# Patient Record
Sex: Female | Born: 1980 | Race: Black or African American | Hispanic: No | Marital: Single
Health system: Midwestern US, Community
[De-identification: ages and names within clinical notes are randomized; demographics above are authoritative.]

## PROBLEM LIST (undated history)

## (undated) DIAGNOSIS — O039 Complete or unspecified spontaneous abortion without complication: Principal | ICD-10-CM

## (undated) DIAGNOSIS — J4541 Moderate persistent asthma with (acute) exacerbation: Secondary | ICD-10-CM

## (undated) DIAGNOSIS — O26893 Other specified pregnancy related conditions, third trimester: Secondary | ICD-10-CM

## (undated) DIAGNOSIS — J45909 Unspecified asthma, uncomplicated: Secondary | ICD-10-CM

---

## 2013-06-25 ENCOUNTER — Emergency Department (HOSPITAL_COMMUNITY): Payer: Medicaid - Out of State

## 2013-06-25 ENCOUNTER — Emergency Department (HOSPITAL_COMMUNITY)
Admission: EM | Admit: 2013-06-25 | Discharge: 2013-06-25 | Disposition: A | Discharge status: Home / Self Care | Payer: Medicaid - Out of State | Attending: Emergency Medicine | Admitting: Emergency Medicine

## 2013-06-25 ENCOUNTER — Encounter (HOSPITAL_COMMUNITY): Payer: Self-pay | Admitting: Emergency Medicine

## 2013-06-25 DIAGNOSIS — R509 Fever, unspecified: Secondary | ICD-10-CM | POA: Insufficient documentation

## 2013-06-25 DIAGNOSIS — J45909 Unspecified asthma, uncomplicated: Secondary | ICD-10-CM | POA: Insufficient documentation

## 2013-06-25 DIAGNOSIS — N289 Disorder of kidney and ureter, unspecified: Secondary | ICD-10-CM

## 2013-06-25 DIAGNOSIS — F17200 Nicotine dependence, unspecified, uncomplicated: Secondary | ICD-10-CM | POA: Insufficient documentation

## 2013-06-25 DIAGNOSIS — R05 Cough: Secondary | ICD-10-CM

## 2013-06-25 DIAGNOSIS — IMO0001 Reserved for inherently not codable concepts without codable children: Secondary | ICD-10-CM | POA: Insufficient documentation

## 2013-06-25 DIAGNOSIS — R059 Cough, unspecified: Secondary | ICD-10-CM

## 2013-06-25 DIAGNOSIS — R197 Diarrhea, unspecified: Secondary | ICD-10-CM

## 2013-06-25 DIAGNOSIS — R000 Tachycardia, unspecified: Secondary | ICD-10-CM | POA: Insufficient documentation

## 2013-06-25 DIAGNOSIS — R112 Nausea with vomiting, unspecified: Secondary | ICD-10-CM | POA: Insufficient documentation

## 2013-06-25 DIAGNOSIS — R630 Anorexia: Secondary | ICD-10-CM | POA: Insufficient documentation

## 2013-06-25 DIAGNOSIS — J3489 Other specified disorders of nose and nasal sinuses: Secondary | ICD-10-CM | POA: Insufficient documentation

## 2013-06-25 DIAGNOSIS — R1110 Vomiting, unspecified: Secondary | ICD-10-CM

## 2013-06-25 DIAGNOSIS — E860 Dehydration: Secondary | ICD-10-CM

## 2013-06-25 HISTORY — DX: Unspecified asthma, uncomplicated: J45.909

## 2013-06-25 LAB — BASIC METABOLIC PANEL
BUN: 9 mg/dL (ref 6–23)
CO2: 18 meq/L — AB (ref 19–32)
Calcium: 8.6 mg/dL (ref 8.4–10.5)
Chloride: 103 mEq/L (ref 96–112)
Creatinine, Ser: 1.13 mg/dL — ABNORMAL HIGH (ref 0.50–1.10)
GFR calc Af Amer: 73 mL/min — ABNORMAL LOW (ref >90)
GFR calc non Af Amer: 63 mL/min — ABNORMAL LOW (ref >90)
GLUCOSE: 109 mg/dL — AB (ref 70–99)
Potassium: 4 mEq/L (ref 3.7–5.3)
SODIUM: 138 meq/L (ref 137–147)

## 2013-06-25 MED ORDER — ACETAMINOPHEN 325 MG PO TABS
650.0000 mg | ORAL_TABLET | Freq: Once | ORAL | Status: AC
  Administered 2013-06-25: 650 mg via ORAL
  Filled 2013-06-25: qty 2

## 2013-06-25 MED ORDER — ONDANSETRON 4 MG PO TBDP
8.0000 mg | ORAL_TABLET | Freq: Once | ORAL | Status: AC
  Administered 2013-06-25: 8 mg via ORAL
  Filled 2013-06-25: qty 2

## 2013-06-25 MED ORDER — SODIUM CHLORIDE 0.9 % IV BOLUS (SEPSIS)
1000.0000 mL | Freq: Once | INTRAVENOUS | Status: AC
  Administered 2013-06-25: 1000 mL via INTRAVENOUS

## 2013-06-25 MED ORDER — IBUPROFEN 800 MG PO TABS: 800.0000 mg | ORAL_TABLET | Freq: Once | ORAL | Status: DC

## 2013-06-25 MED ORDER — ONDANSETRON 4 MG PO TBDP: ORAL_TABLET | ORAL | Status: AC

## 2013-06-25 NOTE — Discharge Instructions (Signed)
If you were given medicines take as directed.  If you are on coumadin or contraceptives realize their levels and effectiveness is altered by many different medicines.  If you have any reaction (rash, tongues swelling, other) to the medicines stop taking and see a physician.   Please follow up as directed and return to the ER or see a physician for new or worsening symptoms.  Thank you. Take tylenol and ibuprofen for aches and fevers. Stay well hydrated.

## 2013-06-25 NOTE — ED Provider Notes (Addendum)
CSN: [id number]     Arrival date & time 06/25/13  0341 History   First MD Initiated Contact with Patient 06/25/13 [phone]     Chief Complaint  Patient presents with  . Influenza  . Emesis   (Consider location/radiation/quality/duration/timing/severity/associated sxs/prior Treatment) HPI Comments: 33 yo female with smoking hx presents with vomiting, cough, diarrhea for 3 days.  Nothing improves sxs. No sick contacts.  Told by pcp that she has the flu.  No travel. No neck stiffness.    Patient is a 33 y.o. female presenting with flu symptoms and vomiting. The history is provided by the patient.  Influenza Presenting symptoms: cough, diarrhea, fever, myalgias, nausea and vomiting   Presenting symptoms: no headaches and no shortness of breath   Associated symptoms: chills and nasal congestion   Associated symptoms: no neck stiffness   Emesis Associated symptoms: arthralgias, chills, diarrhea and myalgias   Associated symptoms: no abdominal pain and no headaches     Past Medical History  Diagnosis Date  . Asthma    History reviewed. No pertinent past surgical history. History reviewed. No pertinent family history. History  Substance Use Topics  . Smoking status: Current Every Day Smoker -- 0.25 packs/day    Types: Cigarettes  . Smokeless tobacco: Not on file  . Alcohol Use: No   OB History   Grav Para Term Preterm Abortions TAB SAB Ect Mult Living                 Review of Systems  Constitutional: Positive for fever, chills and appetite change.  HENT: Positive for congestion.   Eyes: Negative for visual disturbance.  Respiratory: Positive for cough. Negative for shortness of breath.   Cardiovascular: Negative for chest pain.  Gastrointestinal: Positive for nausea, vomiting and diarrhea. Negative for abdominal pain.  Genitourinary: Negative for dysuria and flank pain.  Musculoskeletal: Positive for arthralgias and myalgias. Negative for back pain, neck pain and neck stiffness.   Skin: Negative for rash.  Neurological: Negative for light-headedness and headaches.    Allergies  Review of patient's allergies indicates no known allergies.  Home Medications   Current Outpatient Rx  Name  Route  Sig  Dispense  Refill  . ibuprofen (ADVIL,MOTRIN) 200 MG tablet   Oral   Take 800 mg by mouth every 6 (six) hours as needed for fever or moderate pain.         Marland Kitchen Phenylephrine-DM-GG-APAP (TYLENOL COLD/FLU SEVERE PO)   Oral   Take 30 mLs by mouth daily as needed (cold and flu).          BP 102/57  Pulse 127  Temp(Src) 100.6 F (38.1 C) (Oral)  Resp 19  SpO2 98% Physical Exam  Nursing note and vitals reviewed. Constitutional: She appears well-developed and well-nourished.  HENT:  Head: Normocephalic and atraumatic.  Dry m m  Eyes: Right eye exhibits no discharge. Left eye exhibits no discharge.  Neck: Normal range of motion. Neck supple. No tracheal deviation present.  Cardiovascular: Regular rhythm.  Tachycardia present.   Pulmonary/Chest: Effort normal and breath sounds normal.  Abdominal: Soft. She exhibits no distension. There is no tenderness. There is no guarding.  Musculoskeletal: She exhibits no edema.  Neurological: She is alert.  Skin: Skin is warm. No rash noted.    ED Course  Procedures (including critical care time) Labs Review Labs Reviewed  BASIC METABOLIC PANEL - Abnormal; Notable for the following:    CO2 18 (*)    Glucose, Bld 109 (*)  Creatinine, Ser 1.13 (*)    GFR calc non Af Amer 63 (*)    GFR calc Af Amer 73 (*)    All other components within normal limits   Imaging Review Dg Chest 2 View  06/25/2013   CLINICAL DATA:  Fever, vomiting and cough.  EXAM: CHEST  2 VIEW  COMPARISON:  None.  FINDINGS: The lungs are well-aerated and clear. There is no evidence of focal opacification, pleural effusion or pneumothorax.  The heart is normal in size; the mediastinal contour is within normal limits. No acute osseous abnormalities are  seen.  IMPRESSION: No acute cardiopulmonary process seen.   Electronically Signed   By: Roanna RaiderJeffery  Chang M.D.   On: 06/25/2013 05:11    EKG Interpretation   None       MDM   1. Vomiting   2. Diarrhea   3. Cough   ARI Dehydration  Flu sxs. CXR to rout pneumonia. PO and IV fluids, antiemetics. Non toxic appearing. No meningismus.   Pt improved significantly on recheck. CXR no pneumonia, reviewed. Likely viral syndrome. Results and differential diagnosis were discussed with the patient. Close follow up outpatient was discussed, patient comfortable with the plan.   Filed Vitals:   06/25/13 0348 06/25/13 0528 06/25/13 0537  BP: 102/57 106/67   Pulse: 127  89  Temp: 100.6 F (38.1 C)    TempSrc: Oral    Resp: 19    SpO2: 98%        Enid SkeensJoshua M Cyril Woodmansee, MD 06/25/13 [id number]  Enid SkeensJoshua M Halie Gass, MD 06/25/13 [id number]

## 2013-06-25 NOTE — ED Notes (Signed)
Pt ambulatory to b/r, steady gait. Alert, NAD, calm.

## 2013-06-25 NOTE — ED Notes (Signed)
Pt reports dx with flu 2 days ago; reports starting to have blood in emesis and diarrhea today; pt reports has not been feeling better and was to come back to ed; pt last took tylenol 4 hours ago and ibuprofen 30 minutes ago

## 2014-12-31 IMAGING — CR DG CHEST 2V
2 series · 2 of 2 positions shown · non-contrast
Comparison: None.

CLINICAL DATA: Fever, vomiting and cough.

EXAM:
CHEST  2 VIEW

[w chest pa]
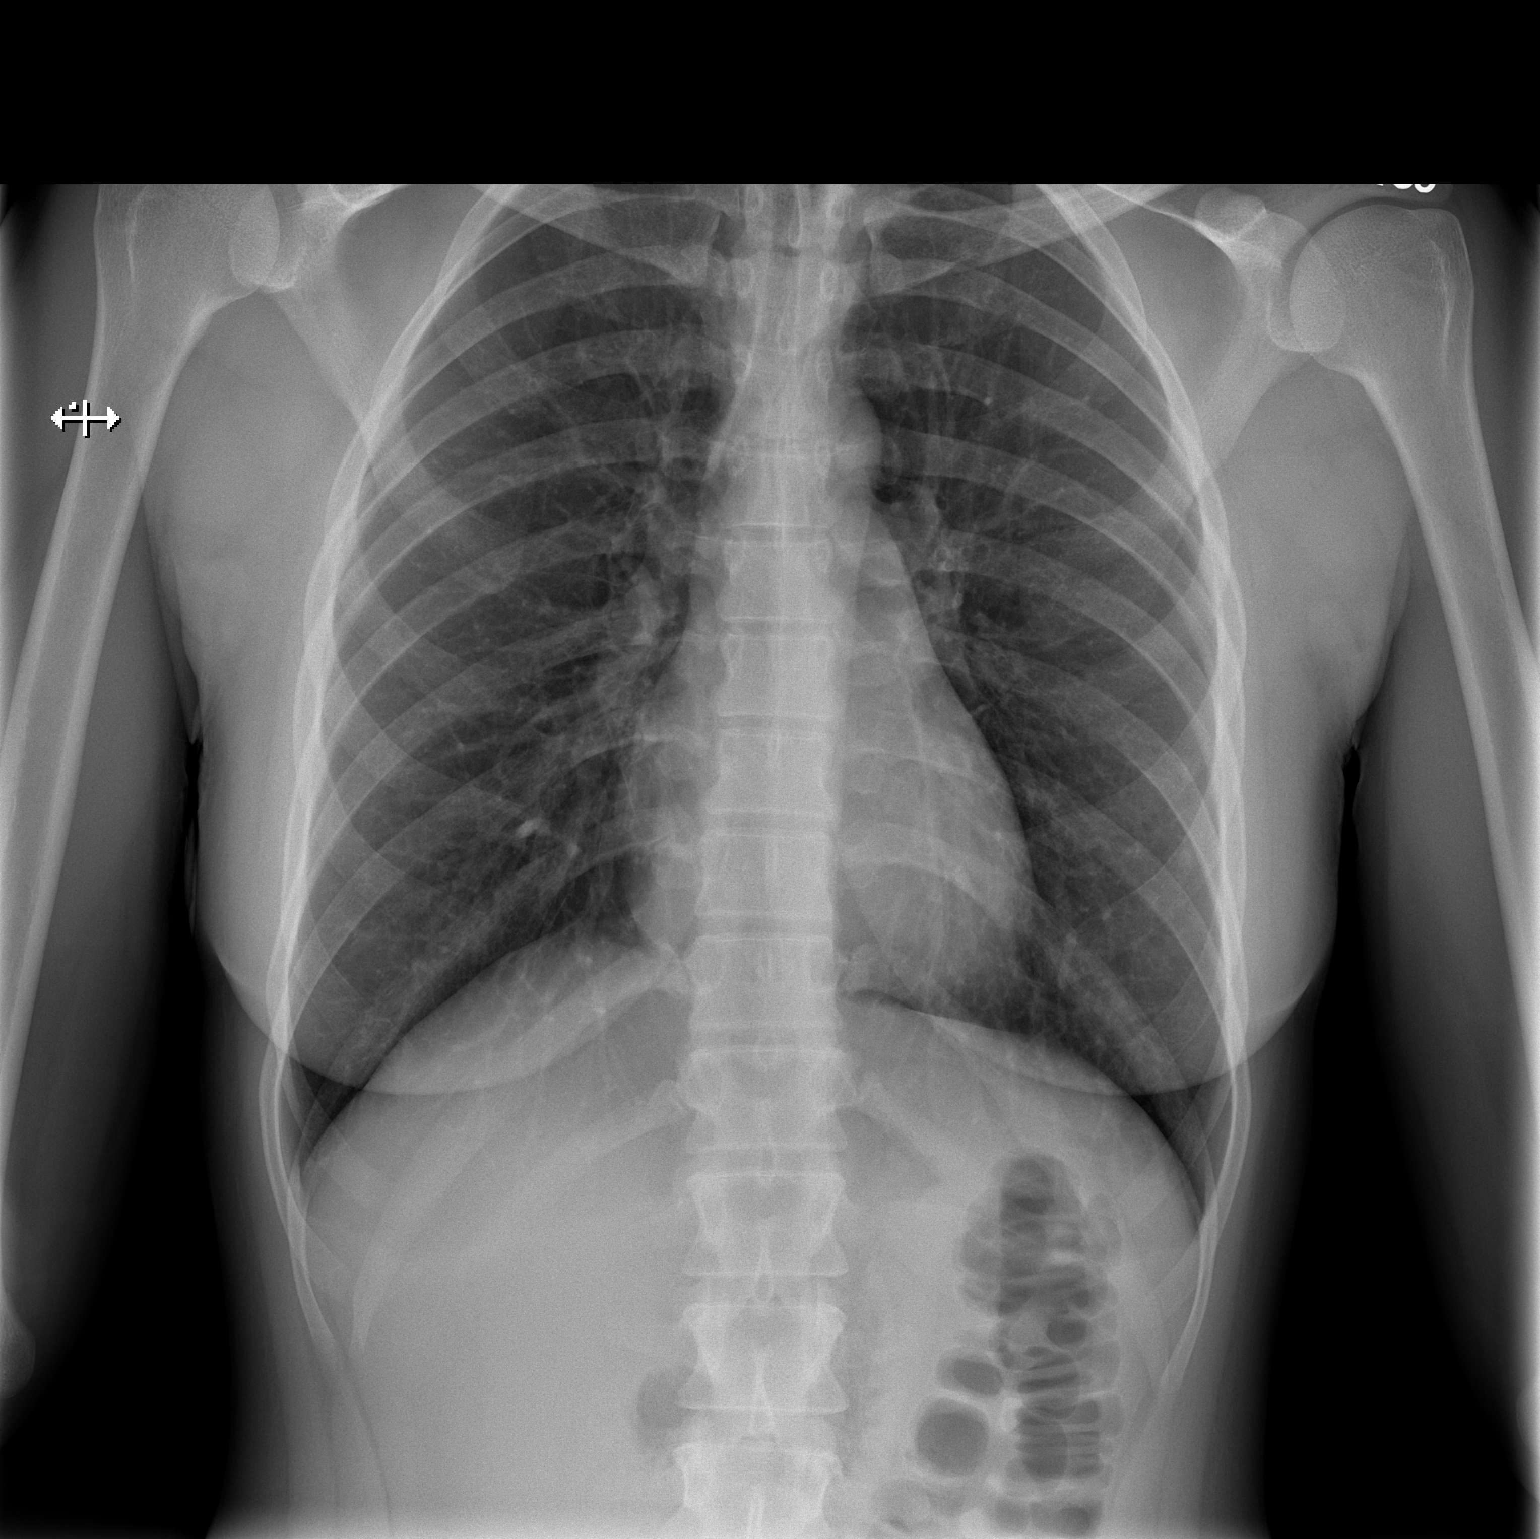

[w chest lat]
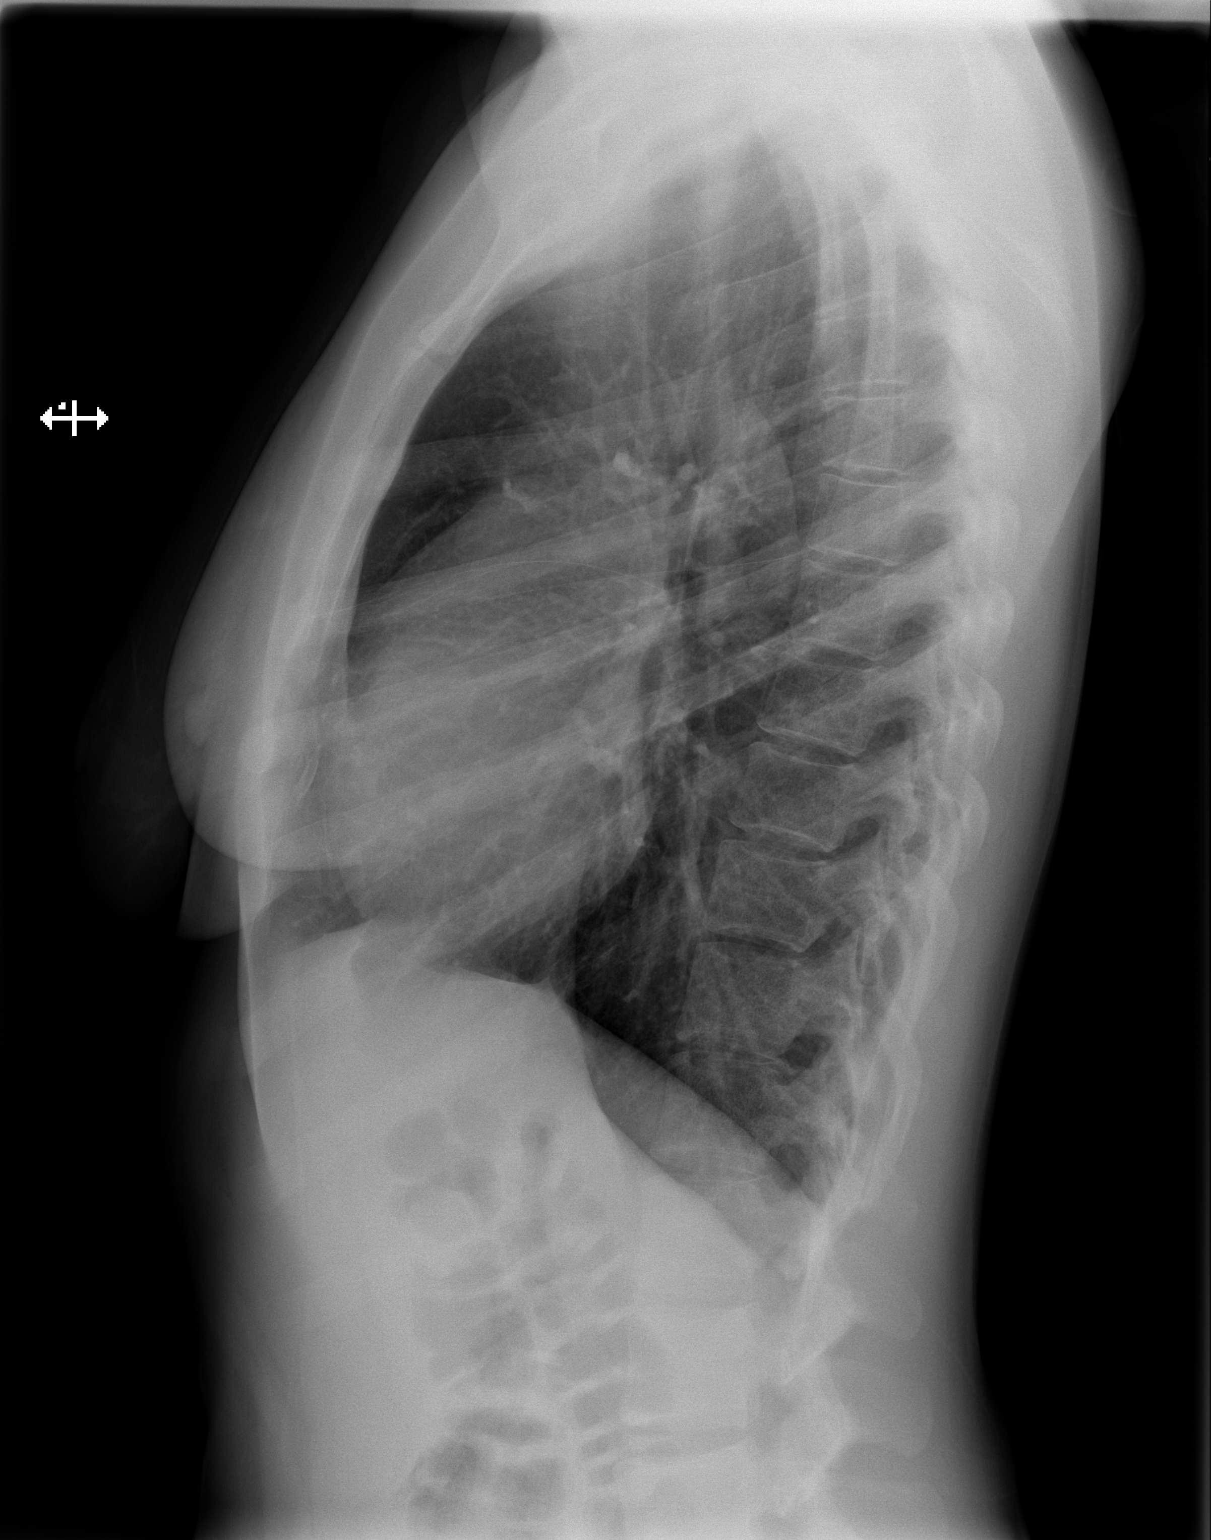

[2 of 2 positions shown; findings below may reference images not displayed]

FINDINGS: The lungs are well-aerated and clear. There is no evidence of focal
opacification, pleural effusion or pneumothorax.

The heart is normal in size; the mediastinal contour is within
normal limits. No acute osseous abnormalities are seen.
IMPRESSION: No acute cardiopulmonary process seen.

## 2015-08-20 ENCOUNTER — Inpatient Hospital Stay
Admit: 2015-08-20 | Discharge: 2015-08-20 | Disposition: A | Discharge status: Home / Self Care | Payer: MEDICAID | Attending: Emergency Medicine

## 2015-08-20 DIAGNOSIS — N898 Other specified noninflammatory disorders of vagina: Secondary | ICD-10-CM

## 2015-08-20 LAB — WET PREP: Wet prep: NONE SEEN

## 2015-08-20 LAB — URINALYSIS W/ REFLEX CULTURE
Bacteria: NEGATIVE /hpf
Bilirubin: NEGATIVE
Blood: NEGATIVE
Glucose: NEGATIVE mg/dL
Ketone: NEGATIVE mg/dL
Leukocyte Esterase: NEGATIVE
Nitrites: NEGATIVE
Protein: NEGATIVE mg/dL
Specific gravity: 1.028 (ref 1.003–1.030)
Urobilinogen: 1 EU/dL (ref 0.2–1.0)
pH (UA): 6 (ref 5.0–8.0)

## 2015-08-20 LAB — KOH, OTHER SOURCES: KOH: NONE SEEN

## 2015-08-20 NOTE — ED Provider Notes (Addendum)
HPI Comments: Connie Morgan is a 35 y.o. female with hx asthma who presents ambulatory to the ED c/o vaginal itching. She reports vaginal irritation that started yesterday morning, with associated dysuria. She was using the restroom today and noticed a "bump" to her vaginal region, so she came to the ED for evaluation. Her LMP was 08/08/2015. She specifically denies vaginal bleeding, vaginal discharge, nausea, vomiting,     PCP: Phys Other, MD  OB/GYN: Kathrene Alu, MD  Soc Hx: +tobacco, -EtOH, -drug use    There are no other complaints, changes or physical findings at this time.    The history is provided by the patient.        Past Medical History:   Diagnosis Date   ??? Asthma        History reviewed. No pertinent surgical history.      History reviewed. No pertinent family history.    Social History     Social History   ??? Marital status: SINGLE     Spouse name: N/A   ??? Number of children: N/A   ??? Years of education: N/A     Occupational History   ??? Not on file.     Social History Main Topics   ??? Smoking status: Light Tobacco Smoker     Packs/day: 0.25   ??? Smokeless tobacco: Not on file   ??? Alcohol use No   ??? Drug use: Yes   ??? Sexual activity: Yes     Partners: Male      Comment: Unprotected sex with one person x 1 year.      Other Topics Concern   ??? Not on file     Social History Narrative   ??? No narrative on file         ALLERGIES: Review of patient's allergies indicates no known allergies.    Review of Systems   Constitutional: Negative.  Negative for fever.   Eyes: Negative.    Respiratory: Negative.  Negative for shortness of breath.    Cardiovascular: Negative for chest pain.   Gastrointestinal: Negative for abdominal pain, nausea and vomiting.   Endocrine: Negative.    Genitourinary: Positive for dysuria. Negative for difficulty urinating, hematuria, vaginal bleeding and vaginal discharge.        +vaginal itching   Musculoskeletal: Negative.    Skin: Negative.    Allergic/Immunologic: Negative.     Neurological: Negative.    Psychiatric/Behavioral: Negative for suicidal ideas.   All other systems reviewed and are negative.      Vitals:    08/20/15 0119 08/20/15 0143 08/20/15 0147   BP: (!) 142/123 130/84    Pulse: 78     Resp: 20 20    Temp: 98 ??F (36.7 ??C)     SpO2: 98% 100% 100%   Weight: 82.6 kg (182 lb 1.6 oz)     Height:  (1.753 m)              Physical Exam   Constitutional: She is oriented to person, place, and time. She appears well-developed and well-nourished. No distress.   HENT:   Head: Normocephalic and atraumatic.   Nose: Nose normal.   Eyes: Conjunctivae and EOM are normal. No scleral icterus.   Neck: Normal range of motion. No tracheal deviation present.   Cardiovascular: Normal rate, regular rhythm, normal heart sounds and intact distal pulses.  Exam reveals no friction rub.    No murmur heard.  Pulmonary/Chest: Effort normal and breath sounds  normal. No stridor. No respiratory distress. She has no wheezes. She has no rales.   Abdominal: Soft. Bowel sounds are normal. She exhibits no distension. There is no tenderness. There is no rebound.   Genitourinary: Uterus normal.       Pelvic exam was performed with patient prone. No labial fusion. There is no rash, lesion or injury on the right labia. There is tenderness on the left labia. There is no rash, lesion or injury on the left labia. Uterus is not deviated, not enlarged, not fixed and not tender. Cervix exhibits discharge. Cervix exhibits no motion tenderness and no friability. Right adnexum displays no mass, no tenderness and no fullness. Left adnexum displays no mass, no tenderness and no fullness. Vaginal discharge (minimal, scant mucoid) found.   Musculoskeletal: Normal range of motion. She exhibits no tenderness.   Neurological: She is alert and oriented to person, place, and time. No cranial nerve deficit.   Skin: Skin is warm and dry. No rash noted. She is not diaphoretic.    Psychiatric: She has a normal mood and affect. Her behavior is normal. Judgment and thought content normal.   Nursing note and vitals reviewed.       MDM  Number of Diagnoses or Management Options  Dysuria:   Ingrown hair:   Vaginal itching:   Diagnosis management comments: DDX:  Sti, herpes, ingrown hair, pregnancy, uti, yeast infection    Plan:  pelvis, ua, upt    Impression:  Dysuria, ingrown hair, vaginal itching         Amount and/or Complexity of Data Reviewed  Clinical lab tests: ordered and reviewed  Review and summarize past medical records: yes      ED Course       Procedures  I reviewed our electronic medical record system for any past medical records that were available that may contribute to the patients current condition, the nursing notes and and vital signs from today's visit    Nursing notes will be reviewed as they become available in realtime while the pt has been in the ED.  Mellody LifeMarie R. Jeniyah Menor, MD    1:50 AM  Discussed the risks of smoking and the benefits of smoking cessation with the pt who verbalized her understanding.   Mellody LifeMarie R. Kanika Bungert, MD    Procedure Note - Pelvic Exam:    2:22 AM  Performed by: Mellody LifeMarie R. Andrina Locken, MD  Chaperoned by: Allyn KennerShannon Cox, ED Tech (female)  Pelvic exam was performed using bimanual and speculum. No pain,no adnexal tenderness, scant amount mucoid discharge. Bump to pt labia resembles ingrown hair. Recommended using warm compresses. Further findings noted in physical exam.   The procedure took 1-15 minutes, and pt tolerated well.  Written by Rosetta PosnerPaul Middleton, ED Scribe, as dictated by Mellody LifeMarie R. Tiarrah Saville, MD.      Progress note:  Pt noted to be feeling relieved, ready for discharge. Discussed labs findings with pt.  Will follow up as instructed. All questions have been answered, pt voiced understanding and agreement with plan.  Discussed use of warm compresses and use of a washcloth while in the shower. Discussed refraiing from shaving the area or waxing until it has fully healed.  Specific return precautions provided as well as instructions to return to the ED should sx worsen at any time.  Mellody LifeMarie R. Mattis Featherly, MD    LABORATORY TESTS:  Recent Results (from the past 12 hour(s))   URINALYSIS W/ REFLEX CULTURE    Collection Time: 08/20/15  1:57  AM   Result Value Ref Range    Color YELLOW/STRAW      Appearance CLOUDY (A) CLEAR      Specific gravity 1.028 1.003 - 1.030      pH (UA) 6.0 5.0 - 8.0      Protein NEGATIVE  NEG mg/dL    Glucose NEGATIVE  NEG mg/dL    Ketone NEGATIVE  NEG mg/dL    Bilirubin NEGATIVE  NEG      Blood NEGATIVE  NEG      Urobilinogen 1.0 0.2 - 1.0 EU/dL    Nitrites NEGATIVE  NEG      Leukocyte Esterase NEGATIVE  NEG      WBC 0-4 0 - 4 /hpf    RBC 0-5 0 - 5 /hpf    Epithelial cells MODERATE (A) FEW /lpf    Bacteria NEGATIVE  NEG /hpf    UA:UC IF INDICATED CULTURE NOT INDICATED BY UA RESULT CNI      Hyaline cast 2-5 0 - 5 /lpf   KOH, OTHER SOURCES    Collection Time: 08/20/15  2:23 AM   Result Value Ref Range    Special Requests: NO SPECIAL REQUESTS      KOH NO YEAST SEEN     WET PREP    Collection Time: 08/20/15  2:23 AM   Result Value Ref Range    Clue cells CLUE CELLS PRESENT      Wet prep NO TRICHOMONAS SEEN         IMAGING RESULTS:  No orders to display       MEDICATIONS GIVEN:  Medications - No data to display    IMPRESSION:  1. Vaginal itching    2. Ingrown hair    3. Dysuria        PLAN:  1. DC home    2.   Follow-up Information     Follow up With Details Comments Contact Info    Your OB/GYN Schedule an appointment as soon as possible for a visit in 2 days          Return to ED if worse       3:20 AM   The patient's results have been reviewed with family and/or caregiver. They verbally convey their understanding and agreement of the patient's signs, symptoms, diagnosis, treatment and prognosis and additionally agree to follow up as recommended in the discharge instructions or to return to the Emergency Room should the patient's condition change prior to their  follow-up appointment. The family and/or caregiver verbally agrees with the care-plan and all of their questions have been answered. The discharge instructions have also been provided to the them with educational information regarding the patient's diagnosis as well a list of reasons why the patient would want to return to the ER prior to their follow-up appointment should their condition change.  Mellody Life, MD            This note will not be viewable in MyChart.

## 2015-08-21 LAB — CHLAMYDIA/GC PCR
Chlamydia amplified: NEGATIVE
N. gonorrhea, amplified: NEGATIVE

## 2015-09-16 ENCOUNTER — Emergency Department: Admit: 2015-09-17 | Payer: MEDICAID | Primary: Family Medicine

## 2015-09-16 DIAGNOSIS — R112 Nausea with vomiting, unspecified: Secondary | ICD-10-CM

## 2015-09-16 NOTE — ED Notes (Signed)
Bedside and Verbal shift change report given to Jim, RN (oncoming nurse) by Matt, RN (offgoing nurse). Report included the following information SBAR, Kardex, ED Summary, MAR and Recent Results.

## 2015-09-16 NOTE — ED Provider Notes (Signed)
HPI Comments: Connie Morgan is a 35 y.o. female with a PMH of asthma presenting ambulatory to the ED from home C/O diarrhea, nausea and vomiting. Patient states that 4 days ago she began having non-bloody watery bowel movements. She states that she has had normal bowel movements occasionally, but several watery stools per day. 2 days ago she began to experience some nausea and non-bloody non-bilious emesis. She states that she is able to keep food and drink down, but has occasional episodes of vomiting. She denies any fevers, chills, new foods, recent travel or antibiotic usage, abdominal pain, dysuria or hematuria. She states that she is currently on day 6 of her menstrual cycle. She denies any heavier than normal bleeding. Denies any history of food allergies, IBS or celiac disease. She states that no one else around her has been getting sick. Patient denies any other symptoms or complaints.    PCP: Phys Other, MD    There are no other complaints, changes or physical findings at this time.    The history is provided by the patient. No language interpreter was used.        Past Medical History:   Diagnosis Date   ??? Asthma        No past surgical history on file.      No family history on file.    Social History     Social History   ??? Marital status: SINGLE     Spouse name: N/A   ??? Number of children: N/A   ??? Years of education: N/A     Occupational History   ??? Not on file.     Social History Main Topics   ??? Smoking status: Light Tobacco Smoker     Packs/day: 0.25   ??? Smokeless tobacco: Not on file   ??? Alcohol use No   ??? Drug use: Yes   ??? Sexual activity: Yes     Partners: Male      Comment: Unprotected sex with one person x 1 year.      Other Topics Concern   ??? Not on file     Social History Narrative   ??? No narrative on file         ALLERGIES: Review of patient's allergies indicates no known allergies.    Review of Systems   Constitutional: Negative for chills and fever.   HENT: Negative for sore throat.     Eyes: Negative for pain.   Respiratory: Negative for cough and shortness of breath.    Cardiovascular: Negative for chest pain.   Gastrointestinal: Positive for diarrhea, nausea and vomiting. Negative for abdominal pain and blood in stool.   Genitourinary: Negative for dysuria and hematuria.   Musculoskeletal: Negative for arthralgias and myalgias.   Skin: Negative for rash.   Neurological: Negative for dizziness, light-headedness, numbness and headaches.   Psychiatric/Behavioral: Negative for behavioral problems and confusion.       Vitals:    09/16/15 2146   BP: 120/76   Pulse: 65   Resp: 18   Temp: 97.9 ??F (36.6 ??C)   SpO2: 100%   Weight: 82.6 kg (182 lb 1.6 oz)   Height: 5' 9"  (1.753 m)            Physical Exam   Constitutional: She is oriented to person, place, and time. She appears well-developed and well-nourished. No distress.   35 y/o AAF in no acute distress. Appears well hydrated and mentating normally.   HENT:  Head: Normocephalic and atraumatic.   Right Ear: External ear normal.   Left Ear: External ear normal.   Nose: Nose normal.   Eyes: Conjunctivae and EOM are normal.   Neck: Normal range of motion. Neck supple.   Cardiovascular: Normal rate, regular rhythm, normal heart sounds and intact distal pulses.    No murmur heard.  Pulmonary/Chest: Effort normal and breath sounds normal. No respiratory distress. She has no wheezes.   Abdominal: Soft. Normal appearance and bowel sounds are normal. She exhibits no distension. There is no tenderness. There is no guarding and no CVA tenderness.   Musculoskeletal: Normal range of motion. She exhibits no edema or tenderness.   Neurological: She is alert and oriented to person, place, and time.   Skin: Skin is warm and dry. No rash noted. She is not diaphoretic.   Psychiatric: She has a normal mood and affect. Her behavior is normal. Judgment normal.   Nursing note and vitals reviewed.       MDM  Number of Diagnoses or Management Options   Diarrhea, unspecified type:   Non-intractable vomiting with nausea, unspecified vomiting type:   Diagnosis management comments: Patient presents with abdominal pain.  DDx: Gastroenteritis, SBO, appendicitis, colitis, IBD, diverticulitis,ureteral stone, GU pathology. Will get labs and serial abdominal exams to determine if a CT is warranted.    Upon re-examination, the patient has no focal abdominal tenderness to palpation.  The patient has a normal WBC and signs and symptoms are consistent with a non-surgical cause of abdominal pain.  The patient has been instructed to return to the ER if the abdominal pain has not improved within 24 hours or if they have any further change in condition for a CT scan of the abdomen and pelvis.  The diagnosis, test results, medications, return instructions and follow up have been discussed with the patient.  The patient has been given the opportunity to ask questions.   The patient agrees and expresses understanding of the diagnosis, follow up and return instructions.  The patient expresses understanding that although testing today is negative that a surgical issue could still develop and that follow up for a CT scan is essential if the symptoms have not improved in 24 hours.       Amount and/or Complexity of Data Reviewed  Clinical lab tests: reviewed and ordered  Tests in the radiology section of CPT??: reviewed and ordered      ED Course       Procedures    Chief Complaint   Patient presents with   ??? Diarrhea     x 4 days patient states that she hasnt been eating much because it just runs right out of her   ??? Vomiting     x2 days        11:57 PM  The patients presenting problems have been discussed, and they are in agreement with the care plan formulated and outlined with them.  I have encouraged them to ask questions as they arise throughout their visit.    MEDICATIONS GIVEN:  Medications   ondansetron (ZOFRAN) injection 4 mg (4 mg IntraVENous Given 09/16/15 2324)    ketorolac (TORADOL) injection 30 mg (30 mg IntraVENous Given 09/16/15 2324)   sodium chloride 0.9 % bolus infusion 1,000 mL (1,000 mL IntraVENous New Bag 09/16/15 2324)       LABS REVIEWED:  Recent Results (from the past 24 hour(s))   CBC WITH AUTOMATED DIFF    Collection Time: 09/16/15  9:55  PM   Result Value Ref Range    WBC 4.1 3.6 - 11.0 K/uL    RBC 4.39 3.80 - 5.20 M/uL    HGB 12.2 11.5 - 16.0 g/dL    HCT 34.4 (L) 35.0 - 47.0 %    MCV 78.4 (L) 80.0 - 99.0 FL    MCH 27.8 26.0 - 34.0 PG    MCHC 35.5 30.0 - 36.5 g/dL    RDW 14.0 11.5 - 14.5 %    PLATELET 329 150 - 400 K/uL    NEUTROPHILS 42 32 - 75 %    LYMPHOCYTES 47 12 - 49 %    MONOCYTES 8 5 - 13 %    EOSINOPHILS 2 0 - 7 %    BASOPHILS 1 0 - 1 %    ABS. NEUTROPHILS 1.7 (L) 1.8 - 8.0 K/UL    ABS. LYMPHOCYTES 1.9 0.8 - 3.5 K/UL    ABS. MONOCYTES 0.3 0.0 - 1.0 K/UL    ABS. EOSINOPHILS 0.1 0.0 - 0.4 K/UL    ABS. BASOPHILS 0.0 0.0 - 0.1 K/UL   METABOLIC PANEL, COMPREHENSIVE    Collection Time: 09/16/15  9:55 PM   Result Value Ref Range    Sodium 143 136 - 145 mmol/L    Potassium 3.5 3.5 - 5.1 mmol/L    Chloride 109 (H) 97 - 108 mmol/L    CO2 26 21 - 32 mmol/L    Anion gap 8 5 - 15 mmol/L    Glucose 89 65 - 100 mg/dL    BUN 7 6 - 20 MG/DL    Creatinine 0.89 0.55 - 1.02 MG/DL    BUN/Creatinine ratio 8 (L) 12 - 20      GFR est AA >60 >60 ml/min/1.96m    GFR est non-AA >60 >60 ml/min/1.779m   Calcium 8.2 (L) 8.5 - 10.1 MG/DL    Bilirubin, total 0.3 0.2 - 1.0 MG/DL    ALT (SGPT) 16 12 - 78 U/L    AST (SGOT) 10 (L) 15 - 37 U/L    Alk. phosphatase 69 45 - 117 U/L    Protein, total 7.4 6.4 - 8.2 g/dL    Albumin 3.4 (L) 3.5 - 5.0 g/dL    Globulin 4.0 2.0 - 4.0 g/dL    A-G Ratio 0.9 (L) 1.1 - 2.2     LIPASE    Collection Time: 09/16/15  9:55 PM   Result Value Ref Range    Lipase 107 73 - 393 U/L   URINALYSIS W/ REFLEX CULTURE    Collection Time: 09/16/15 10:09 PM   Result Value Ref Range    Color YELLOW/STRAW      Appearance CLEAR CLEAR       Specific gravity 1.030 1.003 - 1.030      pH (UA) 5.5 5.0 - 8.0      Protein NEGATIVE  NEG mg/dL    Glucose NEGATIVE  NEG mg/dL    Ketone NEGATIVE  NEG mg/dL    Bilirubin NEGATIVE  NEG      Blood NEGATIVE  NEG      Urobilinogen 1.0 0.2 - 1.0 EU/dL    Nitrites NEGATIVE  NEG      Leukocyte Esterase NEGATIVE  NEG      UA:UC IF INDICATED CULTURE NOT INDICATED BY UA RESULT CNI      WBC 0-4 0 - 4 /hpf    RBC 0-5 0 - 5 /hpf    Epithelial cells FEW FEW /lpf  Bacteria NEGATIVE  NEG /hpf    Hyaline cast 0-2 0 - 5 /lpf   HCG URINE, QL    Collection Time: 09/16/15 10:09 PM   Result Value Ref Range    HCG urine, Ql. NEGATIVE  NEG         VITAL SIGNS:  Patient Vitals for the past 12 hrs:   Temp Pulse Resp BP SpO2   09/16/15 2146 97.9 ??F (36.6 ??C) 65 18 120/76 100 %       RADIOLOGY RESULTS:  The following have been ordered and reviewed:  XR ABD FLAT/ ERECT   Final Result   Exam: 2 view abdomen  ??  Indication: Vomiting, diarrhea  ??  Supine and upright views of the abdomen demonstrate a normal bowel gas pattern.  Lung bases are clear. No free air. Osseous structures are unremarkable.  ??  IMPRESSION  Impression: Normal bowel gas pattern.       PROGRESS NOTES:  12:17 AM  I have just reevaluated the patient. I have reviewed her vital signs and determined there is currently no worsening in their condition or physical exam. Results have been reviewed with them and their questions have been answered. Discussed likely viral cause to symptoms. Reviewed return precautions including fever, worsening vomiting or blood in vomit or stool.    12:23 AM  I have reviewed discharge instructions with the patient and explained medications that she is being discharged with. The patient verbalized understanding and agrees with plan.      DIAGNOSIS:    1. Non-intractable vomiting with nausea, unspecified vomiting type    2. Diarrhea, unspecified type        PLAN:  1. Discharge home  2. Increase fluids  3. BRAT diet   4. Return precautions including fevers, worsening vomiting or blood in vomit or stool.  5. F/u with PCP in 2-3 days for recheck.    Follow-up Information     Follow up With Details Comments Contact Info    Your primary care physician Schedule an appointment as soon as possible for a visit in 3 days      [facility]  As needed, If symptoms worsen [place]  [phone]        Current Discharge Medication List      START taking these medications    Details   ondansetron (ZOFRAN ODT) 4 mg disintegrating tablet Take 1 Tab by mouth every eight (8) hours as needed for Nausea.  Qty: 10 Tab, Refills: 0      dicyclomine (BENTYL) 10 mg capsule Take 2 Caps by mouth four (4) times daily.  Qty: 20 Cap, Refills: 0               ED COURSE: The patients hospital course has been uncomplicated.      DISCHARGE NOTE:  12:23 AM  The care plan has been outline with the patient and/or family, who verbally conveyed understanding and agreement. Available results have been reviewed. Patient and/or family understand the follow up plan as outlined and discharge instructions. Should their condition deterioration at any time after discharge the patient agrees to return, follow up sooner than outlined or seek medical assistance at the closest Emergency Room as soon as possible. Questions have been answered. Discharge instructions and educational information regarding the patient's diagnosis as well a list of reasons why the patient would want to seek immediate medical attention, should their condition change, were reviewed directly with the patient/family

## 2015-09-17 ENCOUNTER — Inpatient Hospital Stay
Admit: 2015-09-17 | Discharge: 2015-09-17 | Disposition: A | Discharge status: Home / Self Care | Payer: MEDICAID | Attending: Emergency Medicine

## 2015-09-17 LAB — CBC WITH AUTOMATED DIFF
ABS. BASOPHILS: 0 10*3/uL (ref 0.0–0.1)
ABS. EOSINOPHILS: 0.1 10*3/uL (ref 0.0–0.4)
ABS. LYMPHOCYTES: 1.9 10*3/uL (ref 0.8–3.5)
ABS. MONOCYTES: 0.3 10*3/uL (ref 0.0–1.0)
ABS. NEUTROPHILS: 1.7 10*3/uL — ABNORMAL LOW (ref 1.8–8.0)
BASOPHILS: 1 % (ref 0–1)
EOSINOPHILS: 2 % (ref 0–7)
HCT: 34.4 % — ABNORMAL LOW (ref 35.0–47.0)
HGB: 12.2 g/dL (ref 11.5–16.0)
LYMPHOCYTES: 47 % (ref 12–49)
MCH: 27.8 PG (ref 26.0–34.0)
MCHC: 35.5 g/dL (ref 30.0–36.5)
MCV: 78.4 FL — ABNORMAL LOW (ref 80.0–99.0)
MONOCYTES: 8 % (ref 5–13)
NEUTROPHILS: 42 % (ref 32–75)
PLATELET: 329 10*3/uL (ref 150–400)
RBC: 4.39 M/uL (ref 3.80–5.20)
RDW: 14 % (ref 11.5–14.5)
WBC: 4.1 10*3/uL (ref 3.6–11.0)

## 2015-09-17 LAB — URINALYSIS W/ REFLEX CULTURE
Bacteria: NEGATIVE /hpf
Bilirubin: NEGATIVE
Blood: NEGATIVE
Glucose: NEGATIVE mg/dL
Ketone: NEGATIVE mg/dL
Leukocyte Esterase: NEGATIVE
Nitrites: NEGATIVE
Protein: NEGATIVE mg/dL
Specific gravity: 1.03 (ref 1.003–1.030)
Urobilinogen: 1 EU/dL (ref 0.2–1.0)
pH (UA): 5.5 (ref 5.0–8.0)

## 2015-09-17 LAB — METABOLIC PANEL, COMPREHENSIVE
A-G Ratio: 0.9 — ABNORMAL LOW (ref 1.1–2.2)
ALT (SGPT): 16 U/L (ref 12–78)
AST (SGOT): 10 U/L — ABNORMAL LOW (ref 15–37)
Albumin: 3.4 g/dL — ABNORMAL LOW (ref 3.5–5.0)
Alk. phosphatase: 69 U/L (ref 45–117)
Anion gap: 8 mmol/L (ref 5–15)
BUN/Creatinine ratio: 8 — ABNORMAL LOW (ref 12–20)
BUN: 7 MG/DL (ref 6–20)
Bilirubin, total: 0.3 MG/DL (ref 0.2–1.0)
CO2: 26 mmol/L (ref 21–32)
Calcium: 8.2 MG/DL — ABNORMAL LOW (ref 8.5–10.1)
Chloride: 109 mmol/L — ABNORMAL HIGH (ref 97–108)
Creatinine: 0.89 MG/DL (ref 0.55–1.02)
GFR est AA: >60 mL/min/{1.73_m2} (ref >60)
GFR est non-AA: >60 mL/min/{1.73_m2} (ref >60)
Globulin: 4 g/dL (ref 2.0–4.0)
Glucose: 89 mg/dL (ref 65–100)
Potassium: 3.5 mmol/L (ref 3.5–5.1)
Protein, total: 7.4 g/dL (ref 6.4–8.2)
Sodium: 143 mmol/L (ref 136–145)

## 2015-09-17 LAB — LIPASE: Lipase: 107 U/L (ref 73–393)

## 2015-09-17 LAB — HCG URINE, QL: HCG urine, QL: NEGATIVE

## 2015-09-17 MED ORDER — ONDANSETRON (PF) 4 MG/2 ML INJECTION
4 mg/2 mL | Freq: Once | INTRAMUSCULAR | Status: AC
Start: 2015-09-17 — End: 2015-09-16
  Administered 2015-09-17: 03:00:00 via INTRAVENOUS

## 2015-09-17 MED ORDER — SODIUM CHLORIDE 0.9% BOLUS IV
0.9 % | Freq: Once | INTRAVENOUS | Status: AC
Start: 2015-09-17 — End: 2015-09-17
  Administered 2015-09-17: 03:00:00 via INTRAVENOUS

## 2015-09-17 MED ORDER — ONDANSETRON 4 MG TAB, RAPID DISSOLVE
4 mg | ORAL_TABLET | Freq: Three times a day (TID) | ORAL | 0 refills | Status: DC | PRN
Start: 2015-09-17 — End: 2015-09-23

## 2015-09-17 MED ORDER — DICYCLOMINE 10 MG CAP
10 mg | ORAL_CAPSULE | Freq: Four times a day (QID) | ORAL | 0 refills | Status: DC
Start: 2015-09-17 — End: 2015-09-23

## 2015-09-17 MED ORDER — KETOROLAC TROMETHAMINE 30 MG/ML INJECTION
30 mg/mL (1 mL) | Freq: Once | INTRAMUSCULAR | Status: AC
Start: 2015-09-17 — End: 2015-09-16
  Administered 2015-09-17: 03:00:00 via INTRAVENOUS

## 2015-09-17 MED FILL — SODIUM CHLORIDE 0.9 % IV: INTRAVENOUS | Qty: 1000

## 2015-09-17 MED FILL — ONDANSETRON (PF) 4 MG/2 ML INJECTION: 4 mg/2 mL | INTRAMUSCULAR | Qty: 2

## 2015-09-17 MED FILL — KETOROLAC TROMETHAMINE 30 MG/ML INJECTION: 30 mg/mL (1 mL) | INTRAMUSCULAR | Qty: 1

## 2015-09-17 NOTE — ED Notes (Signed)
Discharge instructions given to pt.  All questions answered and pt verbalized understanding.   V/S stable @ time of discharge.  Pt ambulatory out of unit.

## 2015-09-23 ENCOUNTER — Inpatient Hospital Stay
Admit: 2015-09-23 | Discharge: 2015-09-23 | Disposition: A | Discharge status: Home / Self Care | Payer: MEDICAID | Attending: Emergency Medicine

## 2015-09-23 DIAGNOSIS — N3090 Cystitis, unspecified without hematuria: Secondary | ICD-10-CM

## 2015-09-23 LAB — METABOLIC PANEL, COMPREHENSIVE
A-G Ratio: 0.8 — ABNORMAL LOW (ref 1.1–2.2)
ALT (SGPT): 17 U/L (ref 12–78)
AST (SGOT): 10 U/L — ABNORMAL LOW (ref 15–37)
Albumin: 3.5 g/dL (ref 3.5–5.0)
Alk. phosphatase: 77 U/L (ref 45–117)
Anion gap: 11 mmol/L (ref 5–15)
BUN/Creatinine ratio: 11 — ABNORMAL LOW (ref 12–20)
BUN: 11 MG/DL (ref 6–20)
Bilirubin, total: 0.4 MG/DL (ref 0.2–1.0)
CO2: 23 mmol/L (ref 21–32)
Calcium: 8.8 MG/DL (ref 8.5–10.1)
Chloride: 108 mmol/L (ref 97–108)
Creatinine: 0.96 MG/DL (ref 0.55–1.02)
GFR est AA: >60 mL/min/{1.73_m2} (ref >60)
GFR est non-AA: >60 mL/min/{1.73_m2} (ref >60)
Globulin: 4.5 g/dL — ABNORMAL HIGH (ref 2.0–4.0)
Glucose: 88 mg/dL (ref 65–100)
Potassium: 3.9 mmol/L (ref 3.5–5.1)
Protein, total: 8 g/dL (ref 6.4–8.2)
Sodium: 142 mmol/L (ref 136–145)

## 2015-09-23 LAB — CBC WITH AUTOMATED DIFF
ABS. BASOPHILS: 0 10*3/uL (ref 0.0–0.1)
ABS. EOSINOPHILS: 0.1 10*3/uL (ref 0.0–0.4)
ABS. LYMPHOCYTES: 1.3 10*3/uL (ref 0.8–3.5)
ABS. MONOCYTES: 0.4 10*3/uL (ref 0.0–1.0)
ABS. NEUTROPHILS: 1.1 10*3/uL — ABNORMAL LOW (ref 1.8–8.0)
BASOPHILS: 1 % (ref 0–1)
EOSINOPHILS: 2 % (ref 0–7)
HCT: 37.4 % (ref 35.0–47.0)
HGB: 12.9 g/dL (ref 11.5–16.0)
LYMPHOCYTES: 44 % (ref 12–49)
MCH: 26.9 PG (ref 26.0–34.0)
MCHC: 34.5 g/dL (ref 30.0–36.5)
MCV: 77.9 FL — ABNORMAL LOW (ref 80.0–99.0)
MONOCYTES: 15 % — ABNORMAL HIGH (ref 5–13)
NEUTROPHILS: 38 % (ref 32–75)
PLATELET: 332 10*3/uL (ref 150–400)
RBC: 4.8 M/uL (ref 3.80–5.20)
RDW: 13.9 % (ref 11.5–14.5)
WBC: 2.9 10*3/uL — ABNORMAL LOW (ref 3.6–11.0)

## 2015-09-23 LAB — URINALYSIS W/ REFLEX CULTURE
Bilirubin: NEGATIVE
Blood: NEGATIVE
Glucose: NEGATIVE mg/dL
Ketone: NEGATIVE mg/dL
Nitrites: NEGATIVE
Protein: NEGATIVE mg/dL
Specific gravity: 1.015 (ref 1.003–1.030)
Urobilinogen: 0.2 EU/dL (ref 0.2–1.0)
pH (UA): 5.5 (ref 5.0–8.0)

## 2015-09-23 LAB — HCG URINE, QL: HCG urine, QL: NEGATIVE

## 2015-09-23 LAB — LIPASE: Lipase: 62 U/L — ABNORMAL LOW (ref 73–393)

## 2015-09-23 MED ORDER — ONDANSETRON (PF) 4 MG/2 ML INJECTION
4 mg/2 mL | INTRAMUSCULAR | Status: AC
Start: 2015-09-23 — End: 2015-09-23
  Administered 2015-09-23: 16:00:00 via INTRAVENOUS

## 2015-09-23 MED ORDER — HYDROCODONE-ACETAMINOPHEN 5 MG-325 MG TAB
5-325 mg | ORAL_TABLET | Freq: Three times a day (TID) | ORAL | 0 refills | Status: DC | PRN
Start: 2015-09-23 — End: 2015-09-24

## 2015-09-23 MED ORDER — KETOROLAC TROMETHAMINE 30 MG/ML INJECTION
30 mg/mL (1 mL) | INTRAMUSCULAR | Status: AC
Start: 2015-09-23 — End: 2015-09-23
  Administered 2015-09-23: 16:00:00 via INTRAVENOUS

## 2015-09-23 MED ORDER — SODIUM CHLORIDE 0.9% BOLUS IV
0.9 % | Freq: Once | INTRAVENOUS | Status: AC
Start: 2015-09-23 — End: 2015-09-23
  Administered 2015-09-23: 16:00:00 via INTRAVENOUS

## 2015-09-23 MED ORDER — PHENAZOPYRIDINE 200 MG TAB
200 mg | ORAL_TABLET | Freq: Three times a day (TID) | ORAL | 0 refills | Status: AC
Start: 2015-09-23 — End: 2015-09-25

## 2015-09-23 MED ORDER — TRIMETHOPRIM-SULFAMETHOXAZOLE 160 MG-800 MG TAB
160-800 mg | ORAL_TABLET | Freq: Two times a day (BID) | ORAL | 0 refills | Status: AC
Start: 2015-09-23 — End: 2015-09-30

## 2015-09-23 MED FILL — SODIUM CHLORIDE 0.9 % IV: INTRAVENOUS | Qty: 1000

## 2015-09-23 MED FILL — ONDANSETRON (PF) 4 MG/2 ML INJECTION: 4 mg/2 mL | INTRAMUSCULAR | Qty: 2

## 2015-09-23 MED FILL — KETOROLAC TROMETHAMINE 30 MG/ML INJECTION: 30 mg/mL (1 mL) | INTRAMUSCULAR | Qty: 1

## 2015-09-23 NOTE — ED Provider Notes (Signed)
HPI Comments: Connie Morgan is a 35 y.o. female with pertinent PMHx of asthma presenting ambulatory to the ED c/o 8/10 central abdominal pain and N/V/D x ~1 week. Pt states that "everything makes me throw up" and states that she has had 3 episodes of vomiting yellow, NB/NB emesis today. Pt states that she has experienced 4 episodes of diarrhea today. Pt denies any exacerbating factors of her pain. Pt notes that all of these symptoms started shortly after eating Chicken nuggets at her workplace. Pt notes associated symptoms of rhinorrhea and generalized myalgias. Pt notes sick contacts with generalized cold symptoms. Pt states that she was seen in the ED last week, for similar symptoms, but has found no relief with the treatments provided at discharge. Pt states that she has been unable to keep any of the Zofran down. Pt states that her LMP began on April 5th 2017. Pt specifically denies any urinary symptoms.    PCP: Phys Other, MD  Social Hx: + tobacco use, - alcohol use, - illicit drug use    There are no other complaints, changes, or physical findings at this time.    The history is provided by the patient. No language interpreter was used.        Past Medical History:   Diagnosis Date   ??? Asthma        No past surgical history on file.      No family history on file.    Social History     Social History   ??? Marital status: SINGLE     Spouse name: N/A   ??? Number of children: N/A   ??? Years of education: N/A     Occupational History   ??? Not on file.     Social History Main Topics   ??? Smoking status: Light Tobacco Smoker     Packs/day: 0.25   ??? Smokeless tobacco: Not on file   ??? Alcohol use No   ??? Drug use: Yes   ??? Sexual activity: Yes     Partners: Male      Comment: Unprotected sex with one person x 1 year.      Other Topics Concern   ??? Not on file     Social History Narrative         ALLERGIES: Review of patient's allergies indicates no known allergies.    Review of Systems    Constitutional: Negative.  Negative for activity change, appetite change, chills, diaphoresis, fever and unexpected weight change.   HENT: Positive for rhinorrhea. Negative for congestion, hearing loss, sinus pressure, sneezing, sore throat and trouble swallowing.    Eyes: Negative.  Negative for pain, redness, itching and visual disturbance.   Respiratory: Negative.  Negative for cough, shortness of breath and wheezing.    Cardiovascular: Negative.  Negative for chest pain, palpitations and leg swelling.   Gastrointestinal: Positive for abdominal pain, diarrhea, nausea and vomiting. Negative for constipation.   Genitourinary: Negative.  Negative for difficulty urinating, dysuria and vaginal pain.   Musculoskeletal: Positive for myalgias (generalized). Negative for arthralgias and gait problem.   Skin: Negative.  Negative for color change, pallor, rash and wound.   Neurological: Negative.  Negative for tremors, weakness, light-headedness, numbness and headaches.   Psychiatric/Behavioral: Negative.    All other systems reviewed and are negative.      Vitals:    09/23/15 1142   BP: 105/74   Pulse: 78   Resp: 18   Temp: 97.9 ??F (36.6 ??C)  SpO2: 99%   Weight: 80.7 kg (177 lb 14.6 oz)   Height: 5' 9"  (1.753 m)            Physical Exam   Constitutional: She is oriented to person, place, and time. Vital signs are normal. She appears well-developed and well-nourished. No distress.   35 y.o. African American female in NAD  Communicates appropriately and in full sentences   HENT:   Head: Normocephalic and atraumatic.   Right Ear: External ear normal.   Left Ear: External ear normal.   Mouth/Throat: Oropharynx is clear and moist. No oropharyngeal exudate.   Eyes: Conjunctivae are normal. Pupils are equal, round, and reactive to light. Right eye exhibits no discharge. Left eye exhibits no discharge. No scleral icterus.   Neck: Normal range of motion. Neck supple.    Cardiovascular: Normal rate, regular rhythm, normal heart sounds and intact distal pulses.  Exam reveals no gallop and no friction rub.    No murmur heard.  Pulmonary/Chest: Effort normal and breath sounds normal. No respiratory distress. She has no wheezes. She has no rales. She exhibits no tenderness.   Abdominal: Soft. Bowel sounds are normal. She exhibits no distension and no mass. There is tenderness (minimal, diffuse). There is no rebound and no guarding.   No pulsatile mass   No abdominal scars  Normoactive bowel sounds x 4  no grimacing throughout entirety of abdominal exam  No rebound, guarding, or peritoneal signs     Musculoskeletal: Normal range of motion. She exhibits no edema, tenderness or deformity.   No neurologic, motor, vascular, or compartment embarrassment observed on exam. No focal neurologic deficits.   Neurological: She is alert and oriented to person, place, and time. No cranial nerve deficit. Coordination normal.   No neurologic, motor, vascular, or compartment embarrassment observed on exam. No focal neurologic deficits.     Skin: Skin is warm and dry. No rash noted. She is not diaphoretic. No erythema. No pallor.   Psychiatric: She has a normal mood and affect.   Nursing note and vitals reviewed.       MDM  Number of Diagnoses or Management Options  Abdominal pain, generalized:   Cystitis:   Diagnosis management comments: DDx: hepatitis, pancreatitis, cholecystitis, appendicitis, diverticulitis, obstruction, UTI, pyelonephritis, gastroenteritis, gastritis, SBO, colitis, IBD, mesenteric ischemia, AAA, ureteral stone, constipation.    Though some of these considerations can be excluded by history and physical exam, others may require additional testing such as laboratory and imaging. Will begin by assessing a CBC, CMP, UA and reevaluating patient to perform serial abdominal exams to determine whether a CT is indicated.    Upon re-examination, the patient has no focal abdominal tenderness to  palpation.  The patient has a normal WBC and signs and symptoms are consistent with a non-surgical cause of abdominal pain.  The patient has been instructed to return to the ER if the abdominal pain has not improved within 24 hours or if they have any further change in condition for a CT scan of the abdomen and pelvis.  The diagnosis, test results, medications, return instructions and follow up have been discussed with the patient.  The patient has been given the opportunity to ask questions.   The patient agrees and expresses understanding of the diagnosis, follow up and return instructions.  The patient expresses understanding that although testing today is negative that a surgical issue could still develop and that follow up for a CT scan is essential if the symptoms have  not improved in 24 hours.               Amount and/or Complexity of Data Reviewed  Clinical lab tests: ordered and reviewed  Review and summarize past medical records: yes    Patient Progress  Patient progress: stable    ED Course       Procedures  I reviewed our electronic medical record system for any past medical records that were available that may contribute to the patients current condition, the nursing notes and and vital signs from today's visit  ??  Nursing notes will be reviewed as they become available in realtime while the pt is in the ED.     Progress Note:  1130  The patients presenting problems have been discussed, and they are in agreement with the care plan formulated and outlined with them. I have encouraged them to ask questions as they arise throughout their visit. Of note, pt states that a ride will present themselves in the ED to take her home.   Written by Blaine Hamper Marijean Bravo, ED Scribe, as dictated by Sheppard Evens, PA-C.     Progress Note:  1:01 PM  Pt re-evaluated. Pt's pain is moderately improved. Pt has experienced no episodes of diarrhea or emesis while in the ED. Informed pt of UTD lab  results, including her UTI. Discussed the risks/benefits of a CT. Pt defers a CT at this time. Will discharge with antibiotics and analgesics.  Written by Blaine Hamper Marijean Bravo, ED Scribe, as dictated by Sheppard Evens, PA-C.     Provider re-evaluated pt. Patient appears well, with well controlled of presenting symptoms  in ED. Provider discussed all available diagnostics, diagnosis, and treatment plan. Thoroughly discussed worrisome signs/symptoms in which pt should immediately return to ED, otherwise follow up with PCP- list given. Patient conveys understanding and agreement to all of the above. All patient's questions were answered by provider.     LABORATORY TESTS:  Recent Results (from the past 12 hour(s))   CBC WITH AUTOMATED DIFF    Collection Time: 09/23/15 12:09 PM   Result Value Ref Range    WBC 2.9 (L) 3.6 - 11.0 K/uL    RBC 4.80 3.80 - 5.20 M/uL    HGB 12.9 11.5 - 16.0 g/dL    HCT 37.4 35.0 - 47.0 %    MCV 77.9 (L) 80.0 - 99.0 FL    MCH 26.9 26.0 - 34.0 PG    MCHC 34.5 30.0 - 36.5 g/dL    RDW 13.9 11.5 - 14.5 %    PLATELET 332 150 - 400 K/uL    NEUTROPHILS 38 32 - 75 %    LYMPHOCYTES 44 12 - 49 %    MONOCYTES 15 (H) 5 - 13 %    EOSINOPHILS 2 0 - 7 %    BASOPHILS 1 0 - 1 %    ABS. NEUTROPHILS 1.1 (L) 1.8 - 8.0 K/UL    ABS. LYMPHOCYTES 1.3 0.8 - 3.5 K/UL    ABS. MONOCYTES 0.4 0.0 - 1.0 K/UL    ABS. EOSINOPHILS 0.1 0.0 - 0.4 K/UL    ABS. BASOPHILS 0.0 0.0 - 0.1 K/UL   METABOLIC PANEL, COMPREHENSIVE    Collection Time: 09/23/15 12:09 PM   Result Value Ref Range    Sodium 142 136 - 145 mmol/L    Potassium 3.9 3.5 - 5.1 mmol/L    Chloride 108 97 - 108 mmol/L    CO2 23 21 - 32 mmol/L  Anion gap 11 5 - 15 mmol/L    Glucose 88 65 - 100 mg/dL    BUN 11 6 - 20 MG/DL    Creatinine 0.96 0.55 - 1.02 MG/DL    BUN/Creatinine ratio 11 (L) 12 - 20      GFR est AA >60 >60 ml/min/1.68m    GFR est non-AA >60 >60 ml/min/1.739m   Calcium 8.8 8.5 - 10.1 MG/DL    Bilirubin, total 0.4 0.2 - 1.0 MG/DL     ALT (SGPT) 17 12 - 78 U/L    AST (SGOT) 10 (L) 15 - 37 U/L    Alk. phosphatase 77 45 - 117 U/L    Protein, total 8.0 6.4 - 8.2 g/dL    Albumin 3.5 3.5 - 5.0 g/dL    Globulin 4.5 (H) 2.0 - 4.0 g/dL    A-G Ratio 0.8 (L) 1.1 - 2.2     URINALYSIS W/ REFLEX CULTURE    Collection Time: 09/23/15 12:09 PM   Result Value Ref Range    Color YELLOW/STRAW      Appearance CLOUDY (A) CLEAR      Specific gravity 1.015 1.003 - 1.030      pH (UA) 5.5 5.0 - 8.0      Protein NEGATIVE  NEG mg/dL    Glucose NEGATIVE  NEG mg/dL    Ketone NEGATIVE  NEG mg/dL    Bilirubin NEGATIVE  NEG      Blood NEGATIVE  NEG      Urobilinogen 0.2 0.2 - 1.0 EU/dL    Nitrites NEGATIVE  NEG      Leukocyte Esterase MODERATE (A) NEG      WBC PENDING /hpf    RBC PENDING /hpf    Epithelial cells PENDING /lpf    Bacteria PENDING /hpf    UA:UC IF INDICATED PENDING    LIPASE    Collection Time: 09/23/15 12:09 PM   Result Value Ref Range    Lipase 62 (L) 73 - 393 U/L     MEDICATIONS GIVEN:  Medications   sodium chloride 0.9 % bolus infusion 1,000 mL (1,000 mL IntraVENous New Bag 09/23/15 1219)   ondansetron (ZOFRAN) injection 4 mg (4 mg IntraVENous Given 09/23/15 1217)   ketorolac (TORADOL) injection 30 mg (30 mg IntraVENous Given 09/23/15 1217)       IMPRESSION:  1. Cystitis    2. Abdominal pain, generalized        PLAN:  1.   Current Discharge Medication List      START taking these medications    Details   trimethoprim-sulfamethoxazole (BACTRIM DS) 160-800 mg per tablet Take 1 Tab by mouth two (2) times a day for 7 days.  Qty: 14 Tab, Refills: 0      phenazopyridine (PYRIDIUM) 200 mg tablet Take 1 Tab by mouth three (3) times daily for 2 days.  Qty: 6 Tab, Refills: 0      HYDROcodone-acetaminophen (NORCO) 5-325 mg per tablet Take 1 Tab by mouth every eight (8) hours as needed for Pain. Max Daily Amount: 3 Tabs.  Qty: 10 Tab, Refills: 0           2.   Follow-up Information     Follow up With Details Comments Contact Info     Phys Other, MD Schedule an appointment as soon as possible for a visit in 2 days As needed, If symptoms worsen, Possible further evaluation and treatment Patient can only remember the practice name and not the physician  [facility] Go to As needed, If symptoms worsen [place]    Eugenie Filler., MD Schedule an appointment as soon as possible for a visit in 2 days As needed, If symptoms worsen, Possible further evaluation and treatment [address]  [place] [address]  [phone]          Return to ED if worse   DISCHARGE NOTE:  1:05 PM  The patient is ready for discharge. The patient's signs, symptoms, diagnosis, and discharge instructions have been discussed and the patient and/or family has conveyed their understanding. The patient and/or family is to follow up as recommended or return to the ER should their symptoms worsen. Plan has been discussed and the patient and/or family is in agreement.   Written by Blaine Hamper Marijean Bravo, ED Scribe, as dictated by Sheppard Evens, PA-C.     Attestation:  This note is prepared by Jerene Pitch A. Ford, acting as Education administrator for [employer].    Sheppard Evens, PA-C: The scribe's documentation has been prepared under my direction and personally reviewed by me in its entirety. I confirm that the note above accurately reflects all work, treatment, procedures, and medical decision making performed by me.                            This note will not be viewable in [facility].

## 2015-09-23 NOTE — ED Notes (Signed)
Discharge instructions reviewed with the patient by the PA.  Patient ambulatory out of the ER prior to final set of vital signs.

## 2015-09-24 ENCOUNTER — Inpatient Hospital Stay
Admit: 2015-09-24 | Discharge: 2015-09-24 | Disposition: A | Discharge status: Home / Self Care | Payer: MEDICAID | Attending: Emergency Medicine

## 2015-09-24 ENCOUNTER — Inpatient Hospital Stay: Admit: 2015-09-24 | Discharge: 2015-09-24 | Payer: MEDICAID | Attending: Emergency Medicine

## 2015-09-24 DIAGNOSIS — R109 Unspecified abdominal pain: Secondary | ICD-10-CM

## 2015-09-24 DIAGNOSIS — S29012A Strain of muscle and tendon of back wall of thorax, initial encounter: Secondary | ICD-10-CM

## 2015-09-24 LAB — URINALYSIS W/ REFLEX CULTURE
Bilirubin: NEGATIVE
Blood: NEGATIVE
Glucose: NEGATIVE mg/dL
Ketone: NEGATIVE mg/dL
Nitrites: POSITIVE — AB
Protein: NEGATIVE mg/dL
Specific gravity: 1.01 (ref 1.003–1.030)
Urobilinogen: 1 EU/dL (ref 0.2–1.0)
pH (UA): 5.5 (ref 5.0–8.0)

## 2015-09-24 LAB — HCG URINE, QL. - POC: Pregnancy test,urine (POC): NEGATIVE

## 2015-09-24 MED ORDER — NAPROXEN 500 MG TAB
500 mg | ORAL_TABLET | Freq: Two times a day (BID) | ORAL | 0 refills | Status: DC
Start: 2015-09-24 — End: 2015-09-24

## 2015-09-24 MED ORDER — HYDROCODONE-ACETAMINOPHEN 5 MG-325 MG TAB
5-325 mg | ORAL | Status: DC | PRN
Start: 2015-09-24 — End: 2015-09-24
  Administered 2015-09-24: 22:00:00 via ORAL

## 2015-09-24 MED ORDER — NAPROXEN 500 MG TAB
500 mg | ORAL_TABLET | Freq: Two times a day (BID) | ORAL | 0 refills | Status: AC
Start: 2015-09-24 — End: 2015-10-04

## 2015-09-24 MED ORDER — TRAMADOL 50 MG TAB
50 mg | ORAL_TABLET | Freq: Four times a day (QID) | ORAL | 0 refills | Status: DC | PRN
Start: 2015-09-24 — End: 2016-01-30

## 2015-09-24 MED ORDER — KETOROLAC TROMETHAMINE 30 MG/ML INJECTION
30 mg/mL (1 mL) | INTRAMUSCULAR | Status: AC
Start: 2015-09-24 — End: 2015-09-24
  Administered 2015-09-24: 22:00:00 via INTRAMUSCULAR

## 2015-09-24 MED FILL — HYDROCODONE-ACETAMINOPHEN 5 MG-325 MG TAB: 5-325 mg | ORAL | Qty: 2

## 2015-09-24 MED FILL — KETOROLAC TROMETHAMINE 30 MG/ML INJECTION: 30 mg/mL (1 mL) | INTRAMUSCULAR | Qty: 2

## 2015-09-24 NOTE — ED Provider Notes (Signed)
Patient is a 35 y.o. female presenting with abdominal pain.   Abdominal Pain    Pertinent negatives include no fever, no diarrhea, no nausea, no vomiting, no dysuria, no frequency, no hematuria, no headaches, no chest pain and no back pain.      To ED with complaints of Right upper back pain with radiation to chest and around side. Pain started this afternoon when she bent over to lift up her 79 month old.  Sts she was bending/twisting and had sharp pain in right upper back.  Severe. Since then has had continued pain that is clearly directly associated with movement of trunk and upper extremities.  Notes had recent UTI, vomiting - but that these symptoms have resolved.  No diarrhea or vomiting today.  sts did not get norco filled.     Past Medical History:   Diagnosis Date   ??? Asthma        History reviewed. No pertinent surgical history.      History reviewed. No pertinent family history.    Social History     Social History   ??? Marital status: SINGLE     Spouse name: N/A   ??? Number of children: N/A   ??? Years of education: N/A     Occupational History   ??? Not on file.     Social History Main Topics   ??? Smoking status: Light Tobacco Smoker     Packs/day: 0.25   ??? Smokeless tobacco: Not on file   ??? Alcohol use No   ??? Drug use: Yes   ??? Sexual activity: Yes     Partners: Male      Comment: Unprotected sex with one person x 1 year.      Other Topics Concern   ??? Not on file     Social History Narrative         ALLERGIES: Review of patient's allergies indicates no known allergies.    Review of Systems   Constitutional: Negative for chills, fatigue and fever.   HENT: Negative for congestion, rhinorrhea and sore throat.    Eyes: Negative for pain and discharge.   Respiratory: Negative for cough and shortness of breath.    Cardiovascular: Negative for chest pain.   Gastrointestinal: Positive for abdominal pain. Negative for diarrhea, nausea and vomiting.    Genitourinary: Negative for dysuria, frequency, hematuria and urgency.   Musculoskeletal: Negative for back pain and neck pain.   Skin: Negative for rash and wound.   Neurological: Negative for seizures, syncope and headaches.   Psychiatric/Behavioral: Negative for confusion. The patient is not nervous/anxious.    All other systems reviewed and are negative.      Vitals:    09/24/15 1626   BP: 104/73   Pulse: 87   Resp: 16   Temp: 99.1 ??F (37.3 ??C)   SpO2: 97%   Weight: 77.1 kg (170 lb)   Height:  (1.753 m)            Physical Exam   Nursing note and vitals reviewed.     GENERAL ASSESSMENT: active, alert, no acute distress, well hydrated, well nourished, four kids at bedside.   SKIN: no lesions, jaundice, petechiae, pallor, cyanosis, ecchymosis  HEAD: Atraumatic, normocephalic.  EARS: bilateral TM's and external ear canals normal.  NOSE: nasal mucosa, septum, turbinates normal bilaterally  MOUTH: mucous membranes moist.  pharynx without erythema or exudate  NECK: supple, full range of motion, no mass  RESP: respiratory effort normal.  clear to auscultation with normal breath sounds bilaterally.   CARD: Regular rate and rhythm, normal S1/S2, no murmurs, normal pulses and capillary fill  ABDOMEN: normal bowel sounds, soft and non-tender throughout, nondistended, no mass, no organomegaly. No tenderness.   No C/T/L vertebral body ttp  Palpable spasm and tenderness in mid/ upper back musculature.   Pain worse with twisting torso. Pain in back worse with full shoulder ROM  NEURO: AAOX3. Non-focal.   EXTREMITY: Normal muscle tone. No deformity or tenderness.  Moving both upper and lower extremities well.       MDM  Number of Diagnoses or Management Options  Diagnosis management comments: DDX:  Muscle strain.   Doubt pnuemothorax given full breath sounds and lack of SOB.   On treatment for UTI.   No evidence of pyelonephritis   Sx clearly related to movement, will treat as such.     ED Course       Procedures            LABORATORY TESTS:  Recent Results (from the past 12 hour(s))   URINALYSIS W/ REFLEX CULTURE    Collection Time: 09/24/15  2:40 PM   Result Value Ref Range    Color YELLOW      Appearance CLOUDY (A) CLEAR      Specific gravity 1.010 1.003 - 1.030      pH (UA) 5.5 5.0 - 8.0      Protein NEGATIVE  NEG mg/dL    Glucose NEGATIVE  NEG mg/dL    Ketone NEGATIVE  NEG mg/dL    Bilirubin NEGATIVE  NEG      Blood NEGATIVE  NEG      Urobilinogen 1.0 0.2 - 1.0 EU/dL    Nitrites POSITIVE (A) NEG      Leukocyte Esterase MODERATE (A) NEG      WBC 5-10 0 - 4 /hpf    RBC 0-5 0 - 5 /hpf    Epithelial cells MODERATE (A) FEW /lpf    Bacteria 1+ (A) NEG /hpf    UA:UC IF INDICATED URINE CULTURE ORDERED (A) CNI     HCG URINE, QL. - POC    Collection Time: 09/24/15  2:42 PM   Result Value Ref Range    Pregnancy test,urine (POC) NEGATIVE  NEG         IMAGING RESULTS:  No orders to display       MEDICATIONS GIVEN:  Medications   HYDROcodone-acetaminophen (NORCO) 5-325 mg per tablet 2 Tab (1 Tab Oral Given 09/24/15 1744)   ketorolac (TORADOL) injection 60 mg (60 mg IntraMUSCular Given 09/24/15 1746)       IMPRESSION:  1. Muscle strain        PLAN:  1.   Current Discharge Medication List      START taking these medications    Details   traMADol (ULTRAM) 50 mg tablet Take 1 Tab by mouth every six (6) hours as needed for Pain. Max Daily Amount: 200 mg.  Qty: 15 Tab, Refills: 0      naproxen (NAPROSYN) 500 mg tablet Take 1 Tab by mouth two (2) times daily (with meals) for 10 days.  Qty: 20 Tab, Refills: 0         STOP taking these medications       HYDROcodone-acetaminophen (NORCO) 5-325 mg per tablet Comments:   Reason for Stopping:             2.   Follow-up Information     Follow  up With Details Comments Contact Info    [facility]  As needed [address]  [place] [address]  [phone]        Return to ED if worse

## 2015-09-24 NOTE — ED Notes (Signed)
Patient returned to the ED, she left earlier because she had to get her children. Patient returned to ED with 4 of her children.

## 2015-09-24 NOTE — ED Notes (Signed)
Patient (s) was given copy of dc instructions and 2 paper script(s) and  electronic scripts.  Patient (s)  verbalized understanding of instructions and script (s).  Patient given a current medication reconciliation form and verbalized understanding of their medications.   Patient (s) verbalized understanding of the importance of discussing medications with  his or her physician or clinic they will be following up with.  Patient alert and oriented and in no acute distress.  Patient offered wheelchair from treatment area to hospital entrance, patient declined wheelchair.     Patient left with friend & children.

## 2015-09-24 NOTE — ED Notes (Signed)
.  See Nursing Assessment      Emergency Department Nursing Plan of Care       The Nursing Plan of Care is developed from the Nursing assessment and Emergency Department Attending provider initial evaluation.  The plan of care may be reviewed in the ???ED Provider note???.    The Plan of Care was developed with the following considerations:   Patient / Family readiness to learn indicated [id number]:verbalized understanding  Persons(s) to be included in education: patient  Barriers to Learning/Limitations:No    Signed     Baldo Ashiffany G Jackson, RN    09/24/2015   5:05 PM

## 2015-09-25 LAB — CULTURE, URINE
Colonies Counted: 45000
Colony Count: 45000

## 2015-09-26 LAB — CULTURE, URINE
Colonies Counted: <1000
Colony Count: <1000
Culture result:: NO GROWTH
Culture: NO GROWTH

## 2016-01-27 ENCOUNTER — Inpatient Hospital Stay
Admit: 2016-01-27 | Discharge: 2016-01-27 | Disposition: A | Discharge status: Home / Self Care | Payer: MEDICAID | Attending: Emergency Medicine

## 2016-01-27 ENCOUNTER — Emergency Department: Admit: 2016-01-27 | Payer: MEDICAID | Primary: Family Medicine

## 2016-01-27 DIAGNOSIS — O26891 Other specified pregnancy related conditions, first trimester: Secondary | ICD-10-CM

## 2016-01-27 LAB — BETA HCG, QT
Beta HCG, QT: 116 m[IU]/mL — ABNORMAL HIGH (ref 0–6)
hCG Quant: 116 m[IU]/mL — ABNORMAL HIGH (ref 0–6)

## 2016-01-27 LAB — URINALYSIS W/ REFLEX CULTURE
Bilirubin: NEGATIVE
Blood: NEGATIVE
Glucose: NEGATIVE mg/dL
Ketone: NEGATIVE mg/dL
Nitrites: NEGATIVE
Protein: NEGATIVE mg/dL
Specific gravity: 1.024 (ref 1.003–1.030)
Urobilinogen: 0.2 EU/dL (ref 0.2–1.0)
pH (UA): 5.5 (ref 5.0–8.0)

## 2016-01-27 LAB — KOH, OTHER SOURCES: KOH: NONE SEEN

## 2016-01-27 LAB — HCG URINE, QL: HCG urine, QL: POSITIVE — AB

## 2016-01-27 LAB — WET PREP: Wet prep: NONE SEEN

## 2016-01-27 MED ORDER — ONDANSETRON 4 MG TAB, RAPID DISSOLVE
4 mg | ORAL_TABLET | Freq: Three times a day (TID) | ORAL | 0 refills | Status: DC | PRN
Start: 2016-01-27 — End: 2016-05-04

## 2016-01-27 MED ORDER — SODIUM CHLORIDE 0.9 % IJ SYRG
Freq: Three times a day (TID) | INTRAMUSCULAR | Status: DC
Start: 2016-01-27 — End: 2016-01-27

## 2016-01-27 MED ORDER — METRONIDAZOLE 0.75 % VAGINAL GEL
0.75 % (37.5mg/5 gram) | Freq: Two times a day (BID) | VAGINAL | 0 refills | Status: AC
Start: 2016-01-27 — End: 2016-02-01

## 2016-01-27 MED ORDER — PRENATAL VITAMIN,CALCIUM,MINERALS-IRON-FOLIC ACID TABLET
ORAL_TABLET | Freq: Every day | ORAL | 0 refills | Status: AC
Start: 2016-01-27 — End: ?

## 2016-01-27 MED ORDER — ONDANSETRON (PF) 4 MG/2 ML INJECTION
4 mg/2 mL | INTRAMUSCULAR | Status: AC
Start: 2016-01-27 — End: 2016-01-27
  Administered 2016-01-27: 10:00:00 via INTRAVENOUS

## 2016-01-27 MED ORDER — SODIUM CHLORIDE 0.9 % IJ SYRG
INTRAMUSCULAR | Status: DC | PRN
Start: 2016-01-27 — End: 2016-01-27
  Administered 2016-01-27: 10:00:00 via INTRAVENOUS

## 2016-01-27 MED FILL — ONDANSETRON (PF) 4 MG/2 ML INJECTION: 4 mg/2 mL | INTRAMUSCULAR | Qty: 2

## 2016-01-27 NOTE — ED Notes (Signed)
Assumed care of patient at this time.    Patient pending evaluation by provider at this time.

## 2016-01-27 NOTE — ED Notes (Signed)
The documentation for this period is being entered following the guidelines as defined in the [facility] downtime policy by Hayley D Odegard.

## 2016-01-27 NOTE — ED Notes (Signed)
Pending cervical swab results at this time.

## 2016-01-27 NOTE — ED Notes (Signed)
MD has reviewed discharge instructions with the patient.  The patient verbalized understanding. Pt ambulatory home.

## 2016-01-27 NOTE — ED Notes (Signed)
Used AIDET to introduce self to patient as member of primary care team, reviewed care plan with patient and discussed patient's concerns and needs with patient.  Opportunities for questions and clarifications given, call bell available to patient.      Pt is alert and oriented x 4.  Pt is hemodynamically stable and respiratory system is stable.

## 2016-01-27 NOTE — ED Provider Notes (Signed)
HPI Comments: Connie Morgan is a 35 y.o. female, who presents ambulatory to the ED c/o multiple episodes of nausea and vomiting x 2 days. Pt reports additional diffuse lower abd pain that began yesterday. She denies any hx of similar sxs. Pt denies any recent medications for her current sxs. She denies any known hx of long standing diseases or daily medications. Pt specifically denies any recent constipation, fever, chills, diarrhea, CP, SOB, lightheadedness, dizziness, numbness, weakness, tingling, BLE swelling, HA, heart palpitations, urinary sxs, changes in BM, changes in PO intake, melena, hematochezia, cough, or congestion.     OB/GYN: Dr. Arelia Sneddon ([place])    Allergies: NKDA  PMHx: Significant for asthma  PSHx: pt denies  Social Hx: +tobacco, -EtOH, +Illicit Drugs (marijuana)    There are no other complaints, changes, or physical findings at this time.      The history is provided by the patient.        Past Medical History:   Diagnosis Date   ??? Asthma        No past surgical history on file.      No family history on file.    Social History     Social History   ??? Marital status: SINGLE     Spouse name: N/A   ??? Number of children: N/A   ??? Years of education: N/A     Occupational History   ??? Not on file.     Social History Main Topics   ??? Smoking status: Light Tobacco Smoker     Packs/day: 0.25   ??? Smokeless tobacco: Not on file   ??? Alcohol use No   ??? Drug use: Yes   ??? Sexual activity: Yes     Partners: Male      Comment: Unprotected sex with one person x 1 year.      Other Topics Concern   ??? Not on file     Social History Narrative         ALLERGIES: Review of patient's allergies indicates no known allergies.    Review of Systems   Constitutional: Negative for chills and fever.   HENT: Negative for congestion, ear pain, rhinorrhea and sore throat.    Respiratory: Negative for cough and shortness of breath.    Cardiovascular: Negative for chest pain, palpitations and leg swelling.    Gastrointestinal: Positive for abdominal pain, nausea and vomiting. Negative for constipation and diarrhea.        No melena  No hematochezia   Endocrine: Negative for polyuria.   Genitourinary: Negative for dysuria, frequency and hematuria.   Neurological: Negative for dizziness, weakness, light-headedness, numbness and headaches.        No tingling   All other systems reviewed and are negative.      Vitals:    01/27/16 0559 01/27/16 0627 01/27/16 0630 01/27/16 0642   BP:    100/56   Pulse:       Resp:       Temp:       SpO2: 97% 98% 97% 99%   Weight:       Height:                Physical Exam   Nursing note and vitals reviewed.  General appearance - well nourished, well appearing, and in no distress  Eyes - pupils equal and reactive, extraocular eye movements intact  ENT - mucous membranes moist, pharynx normal without lesions  Neck - supple, no significant adenopathy; non-tender  to palpation  Chest - clear to auscultation, no wheezes, rales or rhonchi; non-tender to palpation  Heart - normal rate and regular rhythm, S1 and S2 normal, no murmurs noted  Abdomen - soft, tenderness across the lower abd, nondistended, no masses or organomegaly  Musculoskeletal - no joint tenderness, deformity or swelling; normal ROM  Extremities - peripheral pulses normal, no pedal edema  Skin - normal coloration and turgor, no rashes  Neurological - alert, oriented x3, normal speech, no focal findings or movement disorder noted  GU - uterine tenderness to palpation, small amount of milky white vaginal discharge  Written by Lorenda Ishihara, ED Scribe, as dictated by Drena Ham N O'Bier, MD    MDM  Number of Diagnoses or Management Options  Diagnosis management comments:   DDx: pregnancy, UTI, STI, BV, trichomonas       Amount and/or Complexity of Data Reviewed  Clinical lab tests: reviewed and ordered  Tests in the radiology section of CPT??: ordered and reviewed  Review and summarize past medical records: yes   Independent visualization of images, tracings, or specimens: yes    Patient Progress  Patient progress: stable      Procedures    Procedure Note - Pelvic Exam:    5:54 AM  Performed by: Rikayla Demmon N O'Bier, MD  Pelvic exam was performed using bimanual and speculum. Further findings noted in physical exam.   The procedure took 1-15 minutes, and pt tolerated well.  Written by Lorenda Ishihara, ED Scribe, as dictated by Amarionna Arca N O'Bier, MD      Progress note:  6:20 AM  Pt noted to be feeling better, ready for discharge. Updated pt and/or family on all final lab and imaging findings.  Will follow up as instructed. All questions have been answered, pt voiced understanding and agreement with plan. Specific return precautions provided as well as instructions to return to the ED should sx worsen at any time. Vital signs stable for discharge.   Written by Lorenda Ishihara, ED Scribe, as dictated by Rayshad Riviello N O'Bier, MD    Pt noted to receive care in the ED during monthly downtime. Imaging and lab work may not be readily available in EMR.  Hard copy print outs of labs have been reviewed, imaging results (via "sticky notes" from Radiologist) have also been reviewed.    LABORATORY TESTS:    Recent Results (from the past 24 hour(s))   URINALYSIS W/ REFLEX CULTURE    Collection Time: 01/27/16  4:30 AM   Result Value Ref Range    Color YELLOW/STRAW      Appearance CLEAR CLEAR      Specific gravity 1.024 1.003 - 1.030      pH (UA) 5.5 5.0 - 8.0      Protein NEGATIVE  NEG mg/dL    Glucose NEGATIVE  NEG mg/dL    Ketone NEGATIVE  NEG mg/dL    Bilirubin NEGATIVE  NEG      Blood NEGATIVE  NEG      Urobilinogen 0.2 0.2 - 1.0 EU/dL    Nitrites NEGATIVE  NEG      Leukocyte Esterase SMALL (A) NEG      WBC 0-4 0 - 4 /hpf    RBC 0-5 0 - 5 /hpf    Epithelial cells MANY (A) FEW /lpf    Bacteria 1+ (A) NEG /hpf    UA:UC IF INDICATED URINE CULTURE ORDERED (A) CNI      Hyaline cast 2-5 0 - 5 /  lpf   HCG URINE, QL    Collection Time: 01/27/16  4:30 AM    Result Value Ref Range    HCG urine, Ql. POSITIVE (A) NEG     BETA HCG, QT    Collection Time: 01/27/16  5:42 AM   Result Value Ref Range    Beta HCG, QT 116 (H) 0 - 6 MIU/ML   KOH, OTHER SOURCES    Collection Time: 01/27/16  6:43 AM   Result Value Ref Range    Special Requests: NO SPECIAL REQUESTS      KOH NO YEAST SEEN     WET PREP    Collection Time: 01/27/16  6:43 AM   Result Value Ref Range    Clue cells CLUE CELLS PRESENT      Wet prep NO TRICHOMONAS SEEN           IMAGING RESULTS:  US UTS TRANSVAGINAL OB    EXAM: US OB EVAL SINGLE GESTATION LESS THAN 14 WEEKS  ??  INDICATION: Pelvic pain. Pregnant.  ??  COMPARISON: None.  ??  TECHNIQUE: Transvaginal ultrasound of the pelvis.  ??  hCG: Not available   ??  FINDINGS:   The uterus measures 9.4 x 6.1 x 7.1 cm. The endometrium measures 21 mm in  thickness. A round anechoic structure is seen in the cervix measuring 0.9 x 0.7  x 0.8 cm. No internal contents are demonstrated. The placenta is not seen due to  early gestation.  ????  The right ovary measures 3.7 x 3.2 x 2.6 cm and the left ovary measures 2.7 x  2.1 x 2.5 cm. They are normal in appearance with normal flow.  ??  IMPRESSION  IMPRESSION:   There is a thickened endometrium with an anechoic structure in the cervix that  measures 0.9 cm in diameter. This may represent a nabothian cyst or low lying  gestational sac, although a viable gestational sac is unlikely in this location.  No internal contents are demonstrated. Differential considerations include an  early unidentified intrauterine gestation, abortion in progress, or unidentified  ectopic pregnancy. Follow-up hCG and ultrasound is recommended. Normal ovaries.         MEDICATIONS GIVEN:  Medications   ondansetron (ZOFRAN) injection 4 mg (4 mg IntraVENous Given 01/27/16 0536)       IMPRESSION:  1. Pelvic pain affecting pregnancy in first trimester, antepartum    2. BV (bacterial vaginosis)        PLAN:  1.   Discharge Medication List as of 01/27/2016  6:28 AM       START taking these medications    Details   ondansetron (ZOFRAN ODT) 4 mg disintegrating tablet Take 1 Tab by mouth every eight (8) hours as needed for Nausea., Print, Disp-10 Tab, R-0      prenatal multivit-ca-min-fe-fa (PRENATAL VITAMIN) tab Take 1 Tab by mouth daily., Print, Disp-30 Tab, R-0         CONTINUE these medications which have NOT CHANGED    Details   traMADol (ULTRAM) 50 mg tablet Take 1 Tab by mouth every six (6) hours as needed for Pain. Max Daily Amount: 200 mg., Print, Disp-15 Tab, R-0           2.   Follow-up Information     Follow up With Details Comments Contact Info    Tennis Shiphristopher A Manipula, MD Schedule an appointment as soon as possible for a visit  [address]  [address]  [place] [id number]  [phone]      [facility]  If symptoms worsen [address]  [place] [id number]  [phone]        Return to ED if worse     DISCHARGE NOTE:  6:28 AM  The patient's results have been reviewed with family and/or caregiver. They verbally convey their understanding and agreement of the patient's signs, symptoms, diagnosis, treatment, and prognosis. They additionally agree to follow up as recommended in the discharge instructions or to return to the Emergency Room should the patient's condition change prior to their follow-up appointment. The family and/or caregiver verbally agrees with the care-plan and all of their questions have been answered. The discharge instructions have also been provided to the them along with educational information regarding the patient's diagnosis and a list of reasons why the patient would want to return to the ER prior to their follow-up appointment should their condition change.  Written by Lorenda Ishiharaanielle K Seibert, ED Scribe, as dictated by Ade Stmarie N O'Bier, MD.         This note is prepared by Lorenda Ishiharaanielle K Seibert, acting as Scribe for Thelia Tanksley N O'Bier, MD    Angelic Schnelle N O'Bier, MD : The scribe's documentation has been prepared under my  direction and personally reviewed by me in its entirety. I confirm that the note above accurately reflects all work, treatment, procedures, and medical decision making performed by me.    This note will not be viewable in MyChart.

## 2016-01-28 LAB — CULTURE, URINE
Colonies Counted: <10000
Colony Count: <10000
Culture result:: NO GROWTH
Culture: NO GROWTH

## 2016-01-29 LAB — CHLAMYDIA/GC PCR
Chlamydia amplified: NEGATIVE
N. gonorrhea, amplified: NEGATIVE

## 2016-01-30 DIAGNOSIS — O200 Threatened abortion: Secondary | ICD-10-CM
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
## 2016-01-31 ENCOUNTER — Inpatient Hospital Stay
Admit: 2016-01-31 | Discharge: 2016-01-31 | Disposition: A | Discharge status: Home / Self Care | Payer: MEDICAID | Attending: Emergency Medicine

## 2016-01-31 LAB — CBC WITH AUTOMATED DIFF
ABS. BASOPHILS: 0 10*3/uL (ref 0.0–0.1)
ABS. EOSINOPHILS: 0.1 10*3/uL (ref 0.0–0.4)
ABS. LYMPHOCYTES: 2.2 10*3/uL (ref 0.8–3.5)
ABS. MONOCYTES: 0.4 10*3/uL (ref 0.0–1.0)
ABS. NEUTROPHILS: 2 10*3/uL (ref 1.8–8.0)
BASOPHILS: 1 % (ref 0–1)
EOSINOPHILS: 2 % (ref 0–7)
HCT: 33.8 % — ABNORMAL LOW (ref 35.0–47.0)
HGB: 11.8 g/dL (ref 11.5–16.0)
LYMPHOCYTES: 45 % (ref 12–49)
MCH: 26.9 PG (ref 26.0–34.0)
MCHC: 34.9 g/dL (ref 30.0–36.5)
MCV: 77.2 FL — ABNORMAL LOW (ref 80.0–99.0)
MONOCYTES: 9 % (ref 5–13)
NEUTROPHILS: 43 % (ref 32–75)
PLATELET: 323 10*3/uL (ref 150–400)
RBC: 4.38 M/uL (ref 3.80–5.20)
RDW: 14 % (ref 11.5–14.5)
WBC: 4.7 10*3/uL (ref 3.6–11.0)

## 2016-01-31 LAB — METABOLIC PANEL, COMPREHENSIVE
A-G Ratio: 0.8 — ABNORMAL LOW (ref 1.1–2.2)
ALT (SGPT): 15 U/L (ref 12–78)
AST (SGOT): 10 U/L — ABNORMAL LOW (ref 15–37)
Albumin: 3.3 g/dL — ABNORMAL LOW (ref 3.5–5.0)
Alk. phosphatase: 64 U/L (ref 45–117)
Anion gap: 7 mmol/L (ref 5–15)
BUN/Creatinine ratio: 12 (ref 12–20)
BUN: 12 MG/DL (ref 6–20)
Bilirubin, total: 0.4 MG/DL (ref 0.2–1.0)
CO2: 25 mmol/L (ref 21–32)
Calcium: 7.9 MG/DL — ABNORMAL LOW (ref 8.5–10.1)
Chloride: 105 mmol/L (ref 97–108)
Creatinine: 1.01 MG/DL (ref 0.55–1.02)
GFR est AA: >60 mL/min/{1.73_m2} (ref >60)
GFR est non-AA: >60 mL/min/{1.73_m2} (ref >60)
Globulin: 4.2 g/dL — ABNORMAL HIGH (ref 2.0–4.0)
Glucose: 111 mg/dL — ABNORMAL HIGH (ref 65–100)
Potassium: 3.1 mmol/L — ABNORMAL LOW (ref 3.5–5.1)
Protein, total: 7.5 g/dL (ref 6.4–8.2)
Sodium: 137 mmol/L (ref 136–145)

## 2016-01-31 LAB — URINALYSIS W/ REFLEX CULTURE
Bacteria: NEGATIVE /hpf
Blood: NEGATIVE
Glucose: NEGATIVE mg/dL
Ketone: NEGATIVE mg/dL
Leukocyte Esterase: NEGATIVE
Nitrites: NEGATIVE
Protein: NEGATIVE mg/dL
Specific gravity: 1.027 (ref 1.003–1.030)
Urobilinogen: 0.2 EU/dL (ref 0.2–1.0)
pH (UA): 5 (ref 5.0–8.0)

## 2016-01-31 LAB — BILIRUBIN, CONFIRM: Bilirubin UA, confirm: NEGATIVE

## 2016-01-31 LAB — BETA HCG, QT
Beta HCG, QT: 573 m[IU]/mL — ABNORMAL HIGH (ref 0–6)
hCG Quant: 573 m[IU]/mL — ABNORMAL HIGH (ref 0–6)

## 2016-01-31 MED ORDER — ACETAMINOPHEN 325 MG TABLET
325 mg | ORAL | Status: AC
Start: 2016-01-31 — End: 2016-01-30
  Administered 2016-01-31: 03:00:00 via ORAL

## 2016-01-31 MED FILL — MAPAP (ACETAMINOPHEN) 325 MG TABLET: 325 mg | ORAL | Qty: 2

## 2016-01-31 NOTE — ED Notes (Signed)
Eval. Complete. Instructions given to patient by Dr. Gelene Mink. Discharged home ambulatory and alert.

## 2016-02-23 ENCOUNTER — Emergency Department: Admit: 2016-02-23 | Payer: MEDICAID | Primary: Family Medicine

## 2016-02-23 ENCOUNTER — Inpatient Hospital Stay
Admit: 2016-02-23 | Discharge: 2016-02-23 | Disposition: A | Discharge status: Home / Self Care | Payer: MEDICAID | Attending: Emergency Medicine

## 2016-02-23 DIAGNOSIS — O26891 Other specified pregnancy related conditions, first trimester: Secondary | ICD-10-CM

## 2016-02-23 LAB — URINALYSIS W/MICROSCOPIC
Bilirubin: NEGATIVE
Blood: NEGATIVE
Glucose: NEGATIVE mg/dL
Nitrites: NEGATIVE
Protein: NEGATIVE mg/dL
Specific gravity: >1.03 — ABNORMAL HIGH (ref 1.003–1.030)
Urobilinogen: 0.2 EU/dL (ref 0.2–1.0)
pH (UA): 6 (ref 5.0–8.0)

## 2016-02-23 LAB — METABOLIC PANEL, COMPREHENSIVE
A-G Ratio: 0.8 — ABNORMAL LOW (ref 1.1–2.2)
ALT (SGPT): 18 U/L (ref 12–78)
AST (SGOT): 7 U/L — ABNORMAL LOW (ref 15–37)
Albumin: 3.3 g/dL — ABNORMAL LOW (ref 3.5–5.0)
Alk. phosphatase: 56 U/L (ref 45–117)
Anion gap: 8 mmol/L (ref 5–15)
BUN/Creatinine ratio: 8 — ABNORMAL LOW (ref 12–20)
BUN: 8 MG/DL (ref 6–20)
Bilirubin, total: 0.3 MG/DL (ref 0.2–1.0)
CO2: 24 mmol/L (ref 21–32)
Calcium: 8.6 MG/DL (ref 8.5–10.1)
Chloride: 105 mmol/L (ref 97–108)
Creatinine: 0.97 MG/DL (ref 0.55–1.02)
GFR est AA: >60 mL/min/{1.73_m2} (ref >60)
GFR est non-AA: >60 mL/min/{1.73_m2} (ref >60)
Globulin: 4 g/dL (ref 2.0–4.0)
Glucose: 92 mg/dL (ref 65–100)
Potassium: 3.5 mmol/L (ref 3.5–5.1)
Protein, total: 7.3 g/dL (ref 6.4–8.2)
Sodium: 137 mmol/L (ref 136–145)

## 2016-02-23 LAB — CBC WITH AUTOMATED DIFF
ABS. BASOPHILS: 0 10*3/uL (ref 0.0–0.1)
ABS. EOSINOPHILS: 0 10*3/uL (ref 0.0–0.4)
ABS. LYMPHOCYTES: 1.4 10*3/uL (ref 0.8–3.5)
ABS. MONOCYTES: 0.4 10*3/uL (ref 0.0–1.0)
ABS. NEUTROPHILS: 3.1 10*3/uL (ref 1.8–8.0)
BASOPHILS: 0 % (ref 0–1)
EOSINOPHILS: 1 % (ref 0–7)
HCT: 33.4 % — ABNORMAL LOW (ref 35.0–47.0)
HGB: 11.8 g/dL (ref 11.5–16.0)
LYMPHOCYTES: 28 % (ref 12–49)
MCH: 27.6 PG (ref 26.0–34.0)
MCHC: 35.3 g/dL (ref 30.0–36.5)
MCV: 78 FL — ABNORMAL LOW (ref 80.0–99.0)
MONOCYTES: 8 % (ref 5–13)
NEUTROPHILS: 63 % (ref 32–75)
PLATELET: 309 10*3/uL (ref 150–400)
RBC: 4.28 M/uL (ref 3.80–5.20)
RDW: 14.1 % (ref 11.5–14.5)
WBC: 4.9 10*3/uL (ref 3.6–11.0)

## 2016-02-23 LAB — HCG URINE, QL. - POC: Pregnancy test,urine (POC): POSITIVE — AB

## 2016-02-23 MED ORDER — CEPHALEXIN 500 MG CAP
500 mg | ORAL_CAPSULE | Freq: Two times a day (BID) | ORAL | 0 refills | Status: AC
Start: 2016-02-23 — End: 2016-03-01

## 2016-02-23 NOTE — ED Triage Notes (Signed)
C/o lower abdominal pain, diarrhea, and nausea x 4 days.  LMP 12/25/15. Thinks she is [redacted] wks pregnant.  Denies vaginal discharge or bleeding.

## 2016-02-23 NOTE — ED Notes (Signed)
Patient given discharge instructions. No questions or concerns at this time. Patient's VSS and in no acute distress. Patient ambulatory out of unit at discharge.

## 2016-02-23 NOTE — ED Provider Notes (Signed)
HPI Comments: 35 year old female [id number]) LMP July 21st hx asthma presenting to the ED for abdominal pain.  Pt notes that she has been having lower abdominal pain x 4 days.  Pt notes that pain is all across the lower abdomen, sharp and cramping, moderate in severity.  No aggravating or alleviating factors noted.  No urinary symptoms.  No vaginal bleeding or discharge.  Pt notes that she had diarrhea earlier this morning.  No vomiting.  States that 8 weeks ago she had an US showing IUP at [facility].  Saw her OB the 11th of September.  No fever, CP, SOB, or other concerns.    PMHx: asthma  PSX: denies    Patient is a 35 y.o. female presenting with abdominal pain. The history is provided by the patient.   Abdominal Pain    Associated symptoms include diarrhea. Pertinent negatives include no fever, no vomiting, no dysuria and no chest pain.        Past Medical History:   Diagnosis Date   ??? Asthma        History reviewed. No pertinent surgical history.      History reviewed. No pertinent family history.    Social History     Social History   ??? Marital status: SINGLE     Spouse name: N/A   ??? Number of children: N/A   ??? Years of education: N/A     Occupational History   ??? Not on file.     Social History Main Topics   ??? Smoking status: Former Smoker     Packs/day: 0.25   ??? Smokeless tobacco: Never Used   ??? Alcohol use No   ??? Drug use: Yes   ??? Sexual activity: Yes     Partners: Male      Comment: Unprotected sex with one person x 1 year.      Other Topics Concern   ??? Not on file     Social History Narrative         ALLERGIES: Review of patient's allergies indicates no known allergies.    Review of Systems   Constitutional: Negative for fever.   HENT: Negative for congestion.    Eyes: Negative for discharge.   Respiratory: Negative for cough and shortness of breath.    Cardiovascular: Negative for chest pain.   Gastrointestinal: Positive for abdominal pain and diarrhea. Negative for vomiting.    Genitourinary: Positive for pelvic pain. Negative for difficulty urinating, dysuria, vaginal bleeding and vaginal discharge.   Musculoskeletal: Negative for neck stiffness.   Skin: Negative for wound.   Neurological: Negative for syncope.   All other systems reviewed and are negative.      Vitals:    02/23/16 1317   BP: 109/71   Pulse: 85   Resp: 16   Temp: 98.6 ??F (37 ??C)   SpO2: 97%   Weight: 78 kg (172 lb)   Height: 5\' 9"  (1.753 m)            Physical Exam   Constitutional: She is oriented to person, place, and time. She appears well-developed and well-nourished. No distress.   Well-appearing AA female   HENT:   Head: Normocephalic and atraumatic.   Right Ear: External ear normal.   Left Ear: External ear normal.   Eyes: Conjunctivae are normal. No scleral icterus.   Neck: Neck supple. No tracheal deviation present.   Cardiovascular: Normal rate, regular rhythm and normal heart sounds.  Exam reveals no gallop  and no friction rub.    No murmur heard.  Pulmonary/Chest: Effort normal and breath sounds normal. No stridor. No respiratory distress. She has no wheezes.   Abdominal: Soft. She exhibits no distension. There is tenderness. There is no rebound and no guarding.   Mild suprapubic TTP   Genitourinary:   Genitourinary Comments: No inguinal nodes  Os closed, no bleeding  No CMT   Musculoskeletal: Normal range of motion.   Neurological: She is alert and oriented to person, place, and time.   Skin: Skin is warm and dry.   Psychiatric: She has a normal mood and affect. Her behavior is normal.   Nursing note and vitals reviewed.       MDM  Number of Diagnoses or Management Options  Diagnosis management comments: 35 year old female at 8 weeks presenting for 4 days of abdominal cramping.  DDX threatened abortion, PID, ovarian cyst, UTI, appendicitis, etc.  No bleeding or fever.  Reassuring exam, VS, labs.  US showing small subchorionic hemorrhage, otherwise normal.   Discussed with pt treatment of asymptomatic bacteruria with keflex.  Encouraged OB f/u, return precautions given.       Amount and/or Complexity of Data Reviewed  Clinical lab tests: ordered and reviewed  Tests in the radiology section of CPT??: ordered and reviewed  Discuss the patient with other providers: yes (Dr. Markus Daftoyner, ED attending)      ED Course       Procedures

## 2016-02-24 LAB — CULTURE, URINE
Colonies Counted: >100000
Colony Count: >100000

## 2016-04-28 ENCOUNTER — Inpatient Hospital Stay
Admit: 2016-04-28 | Discharge: 2016-04-28 | Payer: MEDICAID | Attending: Student in an Organized Health Care Education/Training Program

## 2016-04-28 DIAGNOSIS — O26892 Other specified pregnancy related conditions, second trimester: Secondary | ICD-10-CM

## 2016-04-28 LAB — POC GROUP A STREP: Group A strep (POC): NEGATIVE

## 2016-04-28 LAB — URINALYSIS W/ RFLX MICROSCOPIC
Bilirubin: NEGATIVE
Blood: NEGATIVE
Glucose: NEGATIVE mg/dL
Ketone: 40 mg/dL — AB
Leukocyte Esterase: NEGATIVE
Nitrites: NEGATIVE
Protein: NEGATIVE mg/dL
Specific gravity: 1.022 (ref 1.003–1.030)
Urobilinogen: 0.2 EU/dL (ref 0.2–1.0)
pH (UA): 5.5 (ref 5.0–8.0)

## 2016-04-28 NOTE — ED Notes (Signed)
Took pt to the BR but she states "I forgot I needed to pee in the cup and just peed in the toilet."

## 2016-04-28 NOTE — ED Notes (Signed)
Pt walked out of the department without notifying the staff.

## 2016-04-28 NOTE — ED Provider Notes (Signed)
HPI Comments: 35 y.o. female with past medical history significant for Asthma who presents from Home with chief complaint of Sore Throat.  Patient states onset "two days ago" of cold like symptoms described as cough, sore throat, and chills. Pt states constant symptoms since onset. Pt also notes lower abdominal pain with aggravation during coughing. Pt reports her young son is currently admitted to [facility] for Pneumonia. Patient is currently "[redacted] weeks pregnant" OB GYN Dr. Marcille Buffy. Pt denies fever, SOB, chest pain, nausea, vomiting, diarrhea, difficulty urinating, or dysuria. Pt denies any other acute medical complaints.     There are no other acute medical concerns at this time.    Social hx: Tobacco -, Alcohol -, Drugs -    Note written by Consuela Mimes, Scribe, as dictated by Casimiro Needle, MD 9:49 AM      The history is provided by the patient.        Past Medical History:   Diagnosis Date   ??? Asthma        History reviewed. No pertinent surgical history.      History reviewed. No pertinent family history.    Social History     Social History   ??? Marital status: SINGLE     Spouse name: N/A   ??? Number of children: N/A   ??? Years of education: N/A     Occupational History   ??? Not on file.     Social History Main Topics   ??? Smoking status: Former Smoker     Packs/day: 0.25   ??? Smokeless tobacco: Never Used   ??? Alcohol use No   ??? Drug use: Yes   ??? Sexual activity: Yes     Partners: Male      Comment: Unprotected sex with one person x 1 year.      Other Topics Concern   ??? Not on file     Social History Narrative         ALLERGIES: Review of patient's allergies indicates no known allergies.    Review of Systems   Constitutional: Positive for chills. Negative for fever.   HENT: Positive for congestion and postnasal drip.    Respiratory: Positive for cough. Negative for shortness of breath.    Cardiovascular: Negative for chest pain.   Gastrointestinal: Positive for abdominal pain. Negative for diarrhea,  nausea and vomiting.   Genitourinary: Negative for difficulty urinating and dysuria.   All other systems reviewed and are negative.      Vitals:    04/28/16 0920   BP: 117/66   Pulse: 79   Resp: 22   Temp: 98 ??F (36.7 ??C)   SpO2: 98%   Weight: 78.1 kg (172 lb 2 oz)   Height: 5\' 9"  (1.753 m)            Physical Exam   Constitutional: She is oriented to person, place, and time. She appears well-developed and well-nourished. No distress.   HENT:   Head: Normocephalic and atraumatic.   Nose: Nose normal.   Mouth/Throat: Uvula is midline. Posterior oropharyngeal erythema present. No oropharyngeal exudate.   Sinus congestion   Eyes: Conjunctivae and EOM are normal. Right eye exhibits no discharge. Left eye exhibits no discharge. No scleral icterus.   Neck: Normal range of motion. Neck supple. No JVD present. No tracheal deviation present. No thyromegaly present.   Cardiovascular: Normal rate, regular rhythm, normal heart sounds and intact distal pulses.  Exam reveals no gallop and no friction rub.  No murmur heard.  Pulmonary/Chest: Effort normal and breath sounds normal. No stridor. No respiratory distress. She has no wheezes. She has no rales. She exhibits no tenderness.   Abdominal: Bowel sounds are normal. She exhibits no distension and no mass. There is no tenderness. There is no rebound.   Genitourinary:   Genitourinary Comments: Gravid uterus    Musculoskeletal: Normal range of motion. She exhibits no edema or tenderness.   Lymphadenopathy:     She has no cervical adenopathy.   Neurological: She is alert and oriented to person, place, and time. No cranial nerve deficit. Coordination normal.   Skin: Skin is warm and dry. No rash noted. She is not diaphoretic. No erythema. No pallor.   Psychiatric: She has a normal mood and affect. Her behavior is normal. Judgment and thought content normal.    Note written by Consuela MimesBrittney Agee, Scribe, as dictated by Casimiro Needleustin W Gerrard Crystal, MD 9:52 AM      MDM   Number of Diagnoses or Management Options  Acute upper respiratory infection:      Amount and/or Complexity of Data Reviewed  Clinical lab tests: ordered and reviewed  Review and summarize past medical records: yes    Risk of Complications, Morbidity, and/or Mortality  Presenting problems: moderate  Diagnostic procedures: moderate  Management options: moderate    Patient Progress  Patient progress: other (comment) (Pt eloped prior to getting lab results.  Apparently pt has daughter who is currently admitted to hospital upstairs.)    ED Course       Procedures    4:14 PM  The patient has been reevaluated. The patient is ready for discharge. The patient's signs, symptoms, diagnosis, and discharge instructions have been discussed and the patient/ family has conveyed their understanding. The patient is to follow up as recommended or return to the ED should their symptoms worsen. Plan has been discussed and the patient is in agreement.    LABORATORY TESTS:  Recent Results (from the past 12 hour(s))   POC GROUP A STREP    Collection Time: 04/28/16 10:44 AM   Result Value Ref Range    Group A strep (POC) NEGATIVE  NEG     URINALYSIS W/ RFLX MICROSCOPIC    Collection Time: 04/28/16 10:45 AM   Result Value Ref Range    Color YELLOW/STRAW      Appearance CLOUDY (A) CLEAR      Specific gravity 1.022 1.003 - 1.030      pH (UA) 5.5 5.0 - 8.0      Protein NEGATIVE  NEG mg/dL    Glucose NEGATIVE  NEG mg/dL    Ketone 40 (A) NEG mg/dL    Bilirubin NEGATIVE  NEG      Blood NEGATIVE  NEG      Urobilinogen 0.2 0.2 - 1.0 EU/dL    Nitrites NEGATIVE  NEG      Leukocyte Esterase NEGATIVE  NEG         IMAGING RESULTS:  No orders to display     No results found.      MEDICATIONS GIVEN:  Medications - No data to display    IMPRESSION:  1. Acute upper respiratory infection        PLAN:  1.   Discharge Medication List as of 04/28/2016 11:43 AM        2.   Follow-up Information     Follow up With Details Comments Contact Info     Phys Other, MD  If symptoms  worsen Patient can only remember the practice name and not the physician      [facility]  If symptoms worsen [address]  [place] [id number]  [phone]          Return to ED for new or worsening symptoms       Casimiro Needleustin W Carnie Bruemmer, MD

## 2016-04-28 NOTE — ED Triage Notes (Addendum)
Triage Note: Patient is coming in with sore throat and cough for 2 days. Patient's son is admitted for pneumonia. Patient is [redacted] weeks pregnant. Patient is also complaining of lower abdominal pain.

## 2016-04-30 LAB — CULTURE, THROAT: Culture result:: NORMAL

## 2016-05-03 DIAGNOSIS — O219 Vomiting of pregnancy, unspecified: Secondary | ICD-10-CM

## 2016-05-03 NOTE — ED Notes (Signed)
Second attempt to call pt back. No answer.

## 2016-05-03 NOTE — ED Notes (Signed)
First attempt to call pt back to. No answer. Will try again. RN attempted to revitalize pt.

## 2016-05-04 ENCOUNTER — Inpatient Hospital Stay: Admit: 2016-05-04 | Discharge: 2016-05-04 | Payer: MEDICAID | Attending: Emergency Medicine

## 2016-05-04 ENCOUNTER — Inpatient Hospital Stay
Admit: 2016-05-04 | Discharge: 2016-05-05 | Disposition: A | Discharge status: Home / Self Care | Payer: MEDICAID | Attending: Emergency Medicine

## 2016-05-04 DIAGNOSIS — O219 Vomiting of pregnancy, unspecified: Secondary | ICD-10-CM

## 2016-05-04 LAB — CBC W/O DIFF
HCT: 31.2 % — ABNORMAL LOW (ref 35.0–47.0)
HGB: 11.2 g/dL — ABNORMAL LOW (ref 11.5–16.0)
MCH: 28.4 PG (ref 26.0–34.0)
MCHC: 35.9 g/dL (ref 30.0–36.5)
MCV: 79 FL — ABNORMAL LOW (ref 80.0–99.0)
PLATELET: 254 10*3/uL (ref 150–400)
RBC: 3.95 M/uL (ref 3.80–5.20)
RDW: 14.3 % (ref 11.5–14.5)
WBC: 6 10*3/uL (ref 3.6–11.0)

## 2016-05-04 LAB — CBC WITH AUTOMATED DIFF
ABS. BASOPHILS: 0 10*3/uL (ref 0.0–0.1)
ABS. EOSINOPHILS: 0.1 10*3/uL (ref 0.0–0.4)
ABS. LYMPHOCYTES: 1.5 10*3/uL (ref 0.8–3.5)
ABS. MONOCYTES: 0.3 10*3/uL (ref 0.0–1.0)
ABS. NEUTROPHILS: 4.5 10*3/uL (ref 1.8–8.0)
BASOPHILS: 0 % (ref 0–1)
EOSINOPHILS: 1 % (ref 0–7)
HCT: 32.1 % — ABNORMAL LOW (ref 35.0–47.0)
HGB: 11.4 g/dL — ABNORMAL LOW (ref 11.5–16.0)
LYMPHOCYTES: 23 % (ref 12–49)
MCH: 27.9 PG (ref 26.0–34.0)
MCHC: 35.5 g/dL (ref 30.0–36.5)
MCV: 78.5 FL — ABNORMAL LOW (ref 80.0–99.0)
MONOCYTES: 4 % — ABNORMAL LOW (ref 5–13)
NEUTROPHILS: 72 % (ref 32–75)
PLATELET: 231 10*3/uL (ref 150–400)
RBC: 4.09 M/uL (ref 3.80–5.20)
RDW: 14.3 % (ref 11.5–14.5)
WBC: 6.3 10*3/uL (ref 3.6–11.0)

## 2016-05-04 LAB — METABOLIC PANEL, BASIC
Anion gap: 11 mmol/L (ref 5–15)
Anion gap: 7 mmol/L (ref 5–15)
BUN/Creatinine ratio: 18 (ref 12–20)
BUN/Creatinine ratio: 7 — ABNORMAL LOW (ref 12–20)
BUN: 11 MG/DL (ref 6–20)
BUN: 5 MG/DL — ABNORMAL LOW (ref 6–20)
CO2: 21 mmol/L (ref 21–32)
CO2: 26 mmol/L (ref 21–32)
Calcium: 8.3 MG/DL — ABNORMAL LOW (ref 8.5–10.1)
Calcium: 8.4 MG/DL — ABNORMAL LOW (ref 8.5–10.1)
Chloride: 105 mmol/L (ref 97–108)
Chloride: 105 mmol/L (ref 97–108)
Creatinine: 0.62 MG/DL (ref 0.55–1.02)
Creatinine: 0.67 MG/DL (ref 0.55–1.02)
GFR est AA: >60 mL/min/{1.73_m2} (ref >60)
GFR est AA: >60 mL/min/{1.73_m2} (ref >60)
GFR est non-AA: >60 mL/min/{1.73_m2} (ref >60)
GFR est non-AA: >60 mL/min/{1.73_m2} (ref >60)
Glucose: 168 mg/dL — ABNORMAL HIGH (ref 65–100)
Glucose: 83 mg/dL (ref 65–100)
Potassium: 2.9 mmol/L — ABNORMAL LOW (ref 3.5–5.1)
Potassium: 3.6 mmol/L (ref 3.5–5.1)
Sodium: 137 mmol/L (ref 136–145)
Sodium: 138 mmol/L (ref 136–145)

## 2016-05-04 LAB — BETA HCG, QT
Beta HCG, QT: 7820 m[IU]/mL — ABNORMAL HIGH (ref 0–6)
hCG Quant: 7820 m[IU]/mL — ABNORMAL HIGH (ref 0–6)

## 2016-05-04 LAB — MAGNESIUM: Magnesium: 1.4 mg/dL — ABNORMAL LOW (ref 1.6–2.4)

## 2016-05-04 MED ORDER — SODIUM CHLORIDE 0.9% BOLUS IV
0.9 % | INTRAVENOUS | Status: DC
Start: 2016-05-04 — End: 2016-05-04

## 2016-05-04 MED ORDER — ONDANSETRON (PF) 4 MG/2 ML INJECTION
4 mg/2 mL | INTRAMUSCULAR | Status: DC
Start: 2016-05-04 — End: 2016-05-04

## 2016-05-04 MED ORDER — ONDANSETRON (PF) 4 MG/2 ML INJECTION
4 mg/2 mL | INTRAMUSCULAR | Status: DC
Start: 2016-05-04 — End: 2016-05-03

## 2016-05-04 MED ORDER — ONDANSETRON 4 MG TAB, RAPID DISSOLVE
4 mg | ORAL | Status: AC
Start: 2016-05-04 — End: 2016-05-04
  Administered 2016-05-04: 23:00:00 via ORAL

## 2016-05-04 MED FILL — SODIUM CHLORIDE 0.9 % IV: INTRAVENOUS | Qty: 1000

## 2016-05-04 MED FILL — ONDANSETRON 4 MG TAB, RAPID DISSOLVE: 4 mg | ORAL | Qty: 1

## 2016-05-04 NOTE — ED Provider Notes (Signed)
EMERGENCY DEPARTMENT HISTORY AND PHYSICAL EXAM      Date: 05/04/2016  Patient Name: Connie Morgan    History of Presenting Illness     Chief Complaint   Patient presents with   ??? Vomiting     Can't eat anything because she throws up all the time since 11-22   ??? Abdominal Pain   ??? Dizziness     Can't stand up cause she feels like she is going to pass out.   ??? Headache       History Provided By: Patient    HPI: Connie Morgan, 35 y.o. female, [phone], who is [redacted] weeks pregnant, with PMHx significant for asthma, presents ambulatory to the ED with cc of a cramping lower abdominal pain, nausea, and vomiting (last episode 30 minutes ago) x 1 week. She reports associated symptoms of dizziness "like I'm going to pass out," and diarrhea. She additionally states she had a fever of 102F yesterday, but none today. She reports she had taken an Ibuprofen yesterday for her fever. She states she had last seen her OB at the beginning of this month and had recently called the office, but they had only recommended an increase in fluid intake. She has been non-compliant with this suggestion, noting she would get nauseous, but had last eaten x 2 hours ago. She has another appointment with her OB next month. Pt denies having similar sxs in the past. She denies urinary discomfort, vaginal discharge, or vaginal bleeding. Of note, the pt reports she has not felt her baby move throughout her pregnancy.    PCP: Phys Other, MD    There are no other complaints, changes, or physical findings at this time.    Current Outpatient Prescriptions   Medication Sig Dispense Refill   ??? cephALEXin (KEFLEX) 500 mg capsule Take 1 Cap by mouth four (4) times daily for 7 days. 28 Cap 0   ??? ondansetron (ZOFRAN ODT) 4 mg disintegrating tablet Take 1 Tab by mouth every eight (8) hours as needed for Nausea. 20 Tab 0   ??? acetaminophen (TYLENOL) 325 mg tablet Take 2 Tabs by mouth every four (4) hours as needed for Pain. 20 Tab 0    ??? prenatal multivit-ca-min-fe-fa (PRENATAL VITAMIN) tab Take 1 Tab by mouth daily. 30 Tab 0       Past History     Past Medical History:  Past Medical History:   Diagnosis Date   ??? Asthma        Past Surgical History:  History reviewed. No pertinent surgical history.    Family History:  History reviewed. No pertinent family history.    Social History:  Social History   Substance Use Topics   ??? Smoking status: Former Smoker     Packs/day: 0.25   ??? Smokeless tobacco: Never Used   ??? Alcohol use No       Allergies:  No Known Allergies      Review of Systems   Review of Systems   Constitutional: Negative.  Negative for activity change, appetite change, chills, diaphoresis, fever and unexpected weight change.   HENT: Negative for congestion, hearing loss, rhinorrhea, sinus pressure, sneezing, sore throat and trouble swallowing.    Eyes: Negative for pain, redness, itching and visual disturbance.   Respiratory: Negative for cough, shortness of breath and wheezing.    Cardiovascular: Negative for chest pain, palpitations and leg swelling.   Gastrointestinal: Positive for abdominal pain (+lower), diarrhea, nausea and vomiting. Negative for constipation.  Genitourinary: Negative for dysuria, vaginal bleeding and vaginal discharge.   Musculoskeletal: Negative for arthralgias, gait problem and myalgias.   Skin: Negative for color change, pallor, rash and wound.   Neurological: Positive for dizziness. Negative for tremors, weakness, light-headedness, numbness and headaches.   All other systems reviewed and are negative.      Physical Exam   Physical Exam   Constitutional: She is oriented to person, place, and time. Vital signs are normal. She appears well-developed and well-nourished. No distress.   35 y.o. African American female in NAD  Communicates appropriately and in full sentences  Normal vital signs   HENT:   Head: Normocephalic and atraumatic.   Eyes: Conjunctivae are normal. Pupils are equal, round, and reactive to  light. Right eye exhibits no discharge. Left eye exhibits no discharge.   Neck: Normal range of motion. Neck supple.   No nuchal rigidity   Cardiovascular: Normal rate, regular rhythm and intact distal pulses.    Pulmonary/Chest: Effort normal and breath sounds normal. No respiratory distress. She has no wheezes.   Abdominal: Soft. Bowel sounds are normal. She exhibits no distension. There is no tenderness.   No focal abdominal tenderness   Musculoskeletal: Normal range of motion. She exhibits no edema, tenderness or deformity.   No neurologic, motor, vascular, or compartment embarrassment observed on exam. No focal neurologic deficits.   Neurological: She is alert and oriented to person, place, and time. Coordination normal.   Skin: Skin is warm and dry. No rash noted. She is not diaphoretic. No erythema. No pallor.   Psychiatric: She has a normal mood and affect.   Nursing note and vitals reviewed.        Diagnostic Study Results     Labs -     Recent Results (from the past 12 hour(s))   METABOLIC PANEL, BASIC    Collection Time: 05/04/16  5:58 PM   Result Value Ref Range    Sodium 138 136 - 145 mmol/L    Potassium 3.6 3.5 - 5.1 mmol/L    Chloride 105 97 - 108 mmol/L    CO2 26 21 - 32 mmol/L    Anion gap 7 5 - 15 mmol/L    Glucose 83 65 - 100 mg/dL    BUN 11 6 - 20 MG/DL    Creatinine 2.720.62 5.360.55 - 1.02 MG/DL    BUN/Creatinine ratio 18 12 - 20      GFR est AA >60 >60 ml/min/1.5373m2    GFR est non-AA >60 >60 ml/min/1.5773m2    Calcium 8.4 (L) 8.5 - 10.1 MG/DL   CBC W/O DIFF    Collection Time: 05/04/16  5:58 PM   Result Value Ref Range    WBC 6.0 3.6 - 11.0 K/uL    RBC 3.95 3.80 - 5.20 M/uL    HGB 11.2 (L) 11.5 - 16.0 g/dL    HCT 64.431.2 (L) 03.435.0 - 47.0 %    MCV 79.0 (L) 80.0 - 99.0 FL    MCH 28.4 26.0 - 34.0 PG    MCHC 35.9 30.0 - 36.5 g/dL    RDW 74.214.3 59.511.5 - 63.814.5 %    PLATELET 254 150 - 400 K/uL   TOTAL HCG, QT.    Collection Time: 05/04/16  5:58 PM   Result Value Ref Range    Beta HCG, QT 7820 (H) 0 - 6 MIU/ML    URINALYSIS W/ REFLEX CULTURE    Collection Time: 05/04/16  6:46 PM   Result Value  Ref Range    Color YELLOW/STRAW      Appearance CLOUDY (A) CLEAR      Specific gravity 1.028 1.003 - 1.030      pH (UA) 6.5 5.0 - 8.0      Protein NEGATIVE  NEG mg/dL    Glucose NEGATIVE  NEG mg/dL    Ketone NEGATIVE  NEG mg/dL    Bilirubin NEGATIVE  NEG      Blood NEGATIVE  NEG      Urobilinogen 1.0 0.2 - 1.0 EU/dL    Nitrites NEGATIVE  NEG      Leukocyte Esterase MODERATE (A) NEG      WBC 5-10 0 - 4 /hpf    RBC 0-5 0 - 5 /hpf    Epithelial cells MANY (A) FEW /lpf    Bacteria 4+ (A) NEG /hpf    UA:UC IF INDICATED URINE CULTURE ORDERED (A) CNI      Hyaline cast 2-5 0 - 5 /lpf         Medical Decision Making   I am the first provider for this patient.    I reviewed the vital signs, available nursing notes, past medical history, past surgical history, family history and social history.    Vital Signs-Reviewed the patient's vital signs.  Patient Vitals for the past 12 hrs:   Temp Pulse Resp BP SpO2   05/04/16 1703 98.5 ??F (36.9 ??C) 88 16 113/74 99 %       Records Reviewed: Nursing Notes and Old Medical Records    Provider Notes (Medical Decision Making):   DDx: Abdominal pain in pregnancy, UTI, viral illness, exposure to sick contacts, gastroenteritis, nausea in pregnancy, low suspicion for miscarriage or ectopic given benign abdominal exam    35 yo presents with c/o lower abdominal pain and N/V x 1 week. Pt has followed-up with her OB and been told to take tylenol. Will evaluate with basic labs and UA. Pt has benign abdominal exam and tolerated PO. FHT 160 and appropriate for GA. Labs at baseline. Will treat UTI with keflex and urge close OB follow-up. Pt expresses understanding. Provided customary ED return precautions.     ED Course:   Initial assessment performed. The patients presenting problems have been discussed, and they are in agreement with the care plan formulated and  outlined with them.  I have encouraged them to ask questions as they arise throughout their visit.    Progress Notes:  PROGRESS NOTE:  5:41 PM  Pt has been re-evaluated. Pt is resting comfortably in bed, is in no acute distress. Awaiting lab results.    PROGRESS NOTE:  6:54 PM  Pt has been re-evaluated. Pt is tolerating PO challenge currently and is sleeping comfortably in bed.    Disposition:  DISCHARGE NOTE  7:11 PM  The patient has been re-evaluated and is ready for discharge. Reviewed available results with patient. Counseled patient on diagnosis and care plan. Patient has expressed understanding, and all questions have been answered. Patient agrees with plan and agrees to follow up as recommended, or return to the ED if their symptoms worsen. Discharge instructions have been provided and explained to the patient, along with reasons to return to the ED.      PLAN:  1. Discharge  Discharge Medication List as of 05/04/2016  7:10 PM      START taking these medications    Details   cephALEXin (KEFLEX) 500 mg capsule Take 1 Cap by mouth four (4) times daily for  7 days., Normal, Disp-28 Cap, R-0      acetaminophen (TYLENOL) 325 mg tablet Take 2 Tabs by mouth every four (4) hours as needed for Pain., Normal, Disp-20 Tab, R-0         CONTINUE these medications which have CHANGED    Details   ondansetron (ZOFRAN ODT) 4 mg disintegrating tablet Take 1 Tab by mouth every eight (8) hours as needed for Nausea., Normal, Disp-20 Tab, R-0         CONTINUE these medications which have NOT CHANGED    Details   prenatal multivit-ca-min-fe-fa (PRENATAL VITAMIN) tab Take 1 Tab by mouth daily., Print, Disp-30 Tab, R-0           2.   Follow-up Information     Follow up With Details Comments Contact Info    Phys Other, MD Schedule an appointment as soon as possible for a visit in 2 days As needed, If symptoms worsen, Possible further evaluation and treatment Patient can only remember the practice name and not the physician       [facility] Go to As needed, If symptoms worsen [address]  [place] [id number]  [phone]    Your OB Call today As needed, If symptoms worsen, Possible further evaluation and treatment         Return to ED if worse     Diagnosis     Clinical Impression:   1. Acute cystitis without hematuria    2. Nausea/vomiting in pregnancy        Attestations:    This note is prepared by Delora Fuelonnie Truong, acting as Scribe for [employer] Nolen Lindamood, PA-C.    Darene LamerGabriella Jarmar Rousseau, PA-C: The scribe's documentation has been prepared under my direction and personally reviewed by me in its entirety. I confirm that the note above accurately reflects all work, treatment, procedures, and medical decision making performed by me.

## 2016-05-04 NOTE — ED Notes (Signed)
Assumed care of pt from triage. Pt c/o lower abdominal pain, nausea, and vomiting x 1 week, pt reports she is pregnant and due on 09/25/16. Pt denies vaginal bleeding and any loss of vaginal fluids. Pt reports she took motrin yesterday for pain. Pt educated on importance of not taking motrin during pregnancy and risks associated with motrin in pregnancy. Patient verbalized understanding.

## 2016-05-04 NOTE — ED Notes (Signed)
Pt given PO challenge and tolerated well. Coralie CommonGabriella, PA notified.

## 2016-05-04 NOTE — ED Notes (Signed)
Coralie CommonGabriella, PA at bedside to give written/verbal DC instructions. Patient verbalized understanding.

## 2016-05-04 NOTE — ED Notes (Signed)
Patient left ED prior to RN able to obtain discharge vitals. Patient awake and stable with no signs of acute distress upon DC.

## 2016-05-05 LAB — URINALYSIS W/ REFLEX CULTURE
Bilirubin: NEGATIVE
Blood: NEGATIVE
Glucose: NEGATIVE mg/dL
Ketone: NEGATIVE mg/dL
Nitrites: NEGATIVE
Protein: NEGATIVE mg/dL
Specific gravity: 1.028 (ref 1.003–1.030)
Urobilinogen: 1 EU/dL (ref 0.2–1.0)
pH (UA): 6.5 (ref 5.0–8.0)

## 2016-05-05 MED ORDER — ACETAMINOPHEN 325 MG TABLET
325 mg | ORAL_TABLET | ORAL | 0 refills | Status: DC | PRN
Start: 2016-05-05 — End: 2016-07-08

## 2016-05-05 MED ORDER — CEPHALEXIN 500 MG CAP
500 mg | ORAL_CAPSULE | Freq: Four times a day (QID) | ORAL | 0 refills | Status: AC
Start: 2016-05-05 — End: 2016-05-11

## 2016-05-05 MED ORDER — ONDANSETRON 4 MG TAB, RAPID DISSOLVE
4 mg | ORAL_TABLET | Freq: Three times a day (TID) | ORAL | 0 refills | Status: DC | PRN
Start: 2016-05-05 — End: 2016-07-08

## 2016-05-06 LAB — CULTURE, URINE
Colonies Counted: <1000
Colony Count: <1000
Culture result:: NO GROWTH
Culture: NO GROWTH

## 2016-05-10 ENCOUNTER — Inpatient Hospital Stay
Admit: 2016-05-10 | Discharge: 2016-05-11 | Disposition: A | Discharge status: Home / Self Care | Payer: MEDICAID | Attending: Emergency Medicine

## 2016-05-10 DIAGNOSIS — O99512 Diseases of the respiratory system complicating pregnancy, second trimester: Secondary | ICD-10-CM

## 2016-05-10 LAB — CBC WITH AUTOMATED DIFF
ABS. BASOPHILS: 0 10*3/uL (ref 0.0–0.1)
ABS. EOSINOPHILS: 0 10*3/uL (ref 0.0–0.4)
ABS. LYMPHOCYTES: 1 10*3/uL (ref 0.8–3.5)
ABS. MONOCYTES: 0.5 10*3/uL (ref 0.0–1.0)
ABS. NEUTROPHILS: 3.3 10*3/uL (ref 1.8–8.0)
BASOPHILS: 0 % (ref 0–1)
EOSINOPHILS: 1 % (ref 0–7)
HCT: 29 % — ABNORMAL LOW (ref 35.0–47.0)
HGB: 10.2 g/dL — ABNORMAL LOW (ref 11.5–16.0)
LYMPHOCYTES: 21 % (ref 12–49)
MCH: 27.3 PG (ref 26.0–34.0)
MCHC: 35.2 g/dL (ref 30.0–36.5)
MCV: 77.7 FL — ABNORMAL LOW (ref 80.0–99.0)
MONOCYTES: 10 % (ref 5–13)
NEUTROPHILS: 68 % (ref 32–75)
PLATELET: 257 10*3/uL (ref 150–400)
RBC: 3.73 M/uL — ABNORMAL LOW (ref 3.80–5.20)
RDW: 14.2 % (ref 11.5–14.5)
WBC: 4.8 10*3/uL (ref 3.6–11.0)

## 2016-05-10 LAB — URINALYSIS W/ REFLEX CULTURE
Bilirubin: NEGATIVE
Blood: NEGATIVE
Glucose: NEGATIVE mg/dL
Ketone: NEGATIVE mg/dL
Leukocyte Esterase: NEGATIVE
Nitrites: NEGATIVE
Protein: NEGATIVE mg/dL
Specific gravity: 1.025 (ref 1.003–1.030)
Urobilinogen: 0.2 EU/dL (ref 0.2–1.0)
pH (UA): 6 (ref 5.0–8.0)

## 2016-05-10 LAB — METABOLIC PANEL, COMPREHENSIVE
A-G Ratio: 0.7 — ABNORMAL LOW (ref 1.1–2.2)
ALT (SGPT): 24 U/L (ref 12–78)
AST (SGOT): 20 U/L (ref 15–37)
Albumin: 2.7 g/dL — ABNORMAL LOW (ref 3.5–5.0)
Alk. phosphatase: 54 U/L (ref 45–117)
Anion gap: 9 mmol/L (ref 5–15)
BUN/Creatinine ratio: 8 — ABNORMAL LOW (ref 12–20)
BUN: 4 MG/DL — ABNORMAL LOW (ref 6–20)
Bilirubin, total: 0.2 MG/DL (ref 0.2–1.0)
CO2: 23 mmol/L (ref 21–32)
Calcium: 8.3 MG/DL — ABNORMAL LOW (ref 8.5–10.1)
Chloride: 105 mmol/L (ref 97–108)
Creatinine: 0.5 MG/DL — ABNORMAL LOW (ref 0.55–1.02)
GFR est AA: >60 mL/min/{1.73_m2} (ref >60)
GFR est non-AA: >60 mL/min/{1.73_m2} (ref >60)
Globulin: 4.1 g/dL — ABNORMAL HIGH (ref 2.0–4.0)
Glucose: 79 mg/dL (ref 65–100)
Potassium: 3 mmol/L — ABNORMAL LOW (ref 3.5–5.1)
Protein, total: 6.8 g/dL (ref 6.4–8.2)
Sodium: 137 mmol/L (ref 136–145)

## 2016-05-10 MED ORDER — ACETAMINOPHEN-CODEINE 300 MG-30 MG TAB
300-30 mg | ORAL | Status: AC
Start: 2016-05-10 — End: 2016-05-10
  Administered 2016-05-11: via ORAL

## 2016-05-10 MED ORDER — POTASSIUM CHLORIDE 20 MEQ ORAL PACKET FOR SOLUTION
20 mEq | ORAL | Status: AC
Start: 2016-05-10 — End: 2016-05-10
  Administered 2016-05-11: via ORAL

## 2016-05-10 MED ORDER — AZITHROMYCIN 250 MG TAB
250 mg | ORAL | Status: AC
Start: 2016-05-10 — End: 2016-05-10
  Administered 2016-05-11: via ORAL

## 2016-05-10 MED ORDER — SODIUM CHLORIDE 0.9% BOLUS IV
0.9 % | INTRAVENOUS | Status: AC
Start: 2016-05-10 — End: 2016-05-10
  Administered 2016-05-10: 22:00:00 via INTRAVENOUS

## 2016-05-10 NOTE — ED Notes (Addendum)
Dr. Hetty ElyHeimbach reviewed discharge instructions with the patient.  The patient verbalized understanding. Patient ambulatory out of ED with discharge paperwork in hand. Denied need for wheelchair assistance out of ED.

## 2016-05-10 NOTE — ED Provider Notes (Signed)
EMERGENCY DEPARTMENT HISTORY AND PHYSICAL EXAM      Date: 05/10/2016  Patient Name: Connie Morgan    History of Presenting Illness     Chief Complaint   Patient presents with   ??? Cough     Pt continues with fever x last night and cough Pt seen on 11/23 for same Dx with URI Pt seen on 11/29 Dx with UTI Pt [redacted] weeks pregnant       History Provided By: Patient    HPI: Connie Morgan, 35 y.o. G10P6A3, [redacted] week pregnant female with PMHx significant for asthma, presents ambulatory to the ED with cc of worsening productive cough with yellow sputum, SOB, CP, fever and myalgias with associated post-tussive emesis x 2 weeks. Pt reports that she was seen at [facility] ED on 04/28/2016 for similar sxs and was d/c with the dx of URI. She was seen at [facility] ED on 05/04/2016 for lower abd pain and was dx'd with a UTI and sent home with 586m Keflex 4x/day. Pt states that she is still having some mild lower abd pain. She specifically denies vaginal bleeding or vaginal d/c.       PCP: Phys Other, MD   OBGYN Dr. MHeide Guile   There are no other complaints, changes, or physical findings at this time.    Current Facility-Administered Medications   Medication Dose Route Frequency Provider Last Rate Last Dose   ??? potassium chloride (KLOR-CON) packet 40 mEq  40 mEq Oral NOW CAllen Derry MD       ??? azithromycin (ZITHROMAX) tablet 500 mg  500 mg Oral NOW CAllen Derry MD       ??? acetaminophen-codeine (TYLENOL #3) per tablet 1 Tab  1 Tab Oral NOW CAllen Derry MD         Current Outpatient Prescriptions   Medication Sig Dispense Refill   ??? cephALEXin (KEFLEX) 500 mg capsule Take 1 Cap by mouth four (4) times daily for 7 days. 28 Cap 0   ??? ondansetron (ZOFRAN ODT) 4 mg disintegrating tablet Take 1 Tab by mouth every eight (8) hours as needed for Nausea. 20 Tab 0   ??? acetaminophen (TYLENOL) 325 mg tablet Take 2 Tabs by mouth every four (4) hours as needed for Pain. 20 Tab 0   ??? prenatal multivit-ca-min-fe-fa (PRENATAL VITAMIN) tab Take 1 Tab by  mouth daily. 30 Tab 0       Past History     Past Medical History:  Past Medical History:   Diagnosis Date   ??? Asthma        Past Surgical History:  No past surgical history on file.    Family History:  No family history on file.    Social History:  Social History   Substance Use Topics   ??? Smoking status: Former Smoker     Packs/day: 0.25   ??? Smokeless tobacco: Never Used   ??? Alcohol use No       Allergies:  No Known Allergies      Review of Systems   Review of Systems   Constitutional: Positive for fever. Negative for activity change and chills.   HENT: Negative for congestion and sore throat.    Eyes: Negative for pain and redness.   Respiratory: Positive for cough (productive) and shortness of breath. Negative for chest tightness.    Cardiovascular: Positive for chest pain. Negative for palpitations.   Gastrointestinal: Negative for abdominal pain, diarrhea, nausea and vomiting.   Genitourinary: Negative for dysuria, frequency,  urgency, vaginal bleeding and vaginal discharge.   Musculoskeletal: Positive for myalgias. Negative for back pain and neck pain.   Skin: Negative for rash.   Neurological: Negative for syncope, light-headedness and headaches.   Psychiatric/Behavioral: Negative for confusion.   All other systems reviewed and are negative.      Physical Exam   Physical Exam   Constitutional: She is oriented to person, place, and time. She appears well-developed and well-nourished. No distress.   HENT:   Head: Normocephalic.   Nose: Nose normal.   Mouth/Throat: Oropharynx is clear and moist. No oropharyngeal exudate.   Eyes: Conjunctivae are normal. Pupils are equal, round, and reactive to light. No scleral icterus.   Neck: Normal range of motion. Neck supple. No JVD present. No tracheal deviation present. No thyromegaly present.   Cardiovascular: Normal rate, regular rhythm and intact distal pulses.  Exam reveals no gallop and no friction rub.    No murmur heard.   Pulmonary/Chest: Effort normal and breath sounds normal. No stridor. No respiratory distress. She has no wheezes. She has no rales.   Abdominal: Soft. Bowel sounds are normal. She exhibits no distension. There is no tenderness. There is no rebound and no guarding.   Gravid uterus    Musculoskeletal: Normal range of motion. She exhibits no edema.   Lymphadenopathy:     She has no cervical adenopathy.   Neurological: She is alert and oriented to person, place, and time. No cranial nerve deficit. She exhibits normal muscle tone. Coordination normal.   Skin: Skin is warm and dry. No rash noted. She is not diaphoretic. No erythema.   Psychiatric: She has a normal mood and affect. Her behavior is normal.   Nursing note and vitals reviewed.        Diagnostic Study Results     Labs -     Recent Results (from the past 12 hour(s))   CBC WITH AUTOMATED DIFF    Collection Time: 05/10/16  5:19 PM   Result Value Ref Range    WBC 4.8 3.6 - 11.0 K/uL    RBC 3.73 (L) 3.80 - 5.20 M/uL    HGB 10.2 (L) 11.5 - 16.0 g/dL    HCT 29.0 (L) 35.0 - 47.0 %    MCV 77.7 (L) 80.0 - 99.0 FL    MCH 27.3 26.0 - 34.0 PG    MCHC 35.2 30.0 - 36.5 g/dL    RDW 14.2 11.5 - 14.5 %    PLATELET 257 150 - 400 K/uL    NEUTROPHILS 68 32 - 75 %    LYMPHOCYTES 21 12 - 49 %    MONOCYTES 10 5 - 13 %    EOSINOPHILS 1 0 - 7 %    BASOPHILS 0 0 - 1 %    ABS. NEUTROPHILS 3.3 1.8 - 8.0 K/UL    ABS. LYMPHOCYTES 1.0 0.8 - 3.5 K/UL    ABS. MONOCYTES 0.5 0.0 - 1.0 K/UL    ABS. EOSINOPHILS 0.0 0.0 - 0.4 K/UL    ABS. BASOPHILS 0.0 0.0 - 0.1 K/UL   METABOLIC PANEL, COMPREHENSIVE    Collection Time: 05/10/16  5:19 PM   Result Value Ref Range    Sodium 137 136 - 145 mmol/L    Potassium 3.0 (L) 3.5 - 5.1 mmol/L    Chloride 105 97 - 108 mmol/L    CO2 23 21 - 32 mmol/L    Anion gap 9 5 - 15 mmol/L    Glucose 79 65 -  100 mg/dL    BUN 4 (L) 6 - 20 MG/DL    Creatinine 0.50 (L) 0.55 - 1.02 MG/DL    BUN/Creatinine ratio 8 (L) 12 - 20      GFR est AA >60 >60 ml/min/1.29m     GFR est non-AA >60 >60 ml/min/1.746m   Calcium 8.3 (L) 8.5 - 10.1 MG/DL    Bilirubin, total 0.2 0.2 - 1.0 MG/DL    ALT (SGPT) 24 12 - 78 U/L    AST (SGOT) 20 15 - 37 U/L    Alk. phosphatase 54 45 - 117 U/L    Protein, total 6.8 6.4 - 8.2 g/dL    Albumin 2.7 (L) 3.5 - 5.0 g/dL    Globulin 4.1 (H) 2.0 - 4.0 g/dL    A-G Ratio 0.7 (L) 1.1 - 2.2     URINALYSIS W/ REFLEX CULTURE    Collection Time: 05/10/16  6:18 PM   Result Value Ref Range    Color YELLOW/STRAW      Appearance HAZY (A) CLEAR      Specific gravity 1.025 1.003 - 1.030      pH (UA) 6.0 5.0 - 8.0      Protein NEGATIVE  NEG mg/dL    Glucose NEGATIVE  NEG mg/dL    Ketone NEGATIVE  NEG mg/dL    Bilirubin NEGATIVE  NEG      Blood NEGATIVE  NEG      Urobilinogen 0.2 0.2 - 1.0 EU/dL    Nitrites NEGATIVE  NEG      Leukocyte Esterase NEGATIVE  NEG      WBC 5-10 0 - 4 /hpf    RBC 0-5 0 - 5 /hpf    Epithelial cells MODERATE (A) FEW /lpf    Bacteria 1+ (A) NEG /hpf    UA:UC IF INDICATED URINE CULTURE ORDERED (A) CNI       Medical Decision Making   I am the first provider for this patient.    I reviewed the vital signs, available nursing notes, past medical history, past surgical history, family history and social history.    Vital Signs-Reviewed the patient's vital signs.  Patient Vitals for the past 12 hrs:   Temp Resp BP SpO2   05/10/16 1845 - - 113/75 99 %   05/10/16 1830 - - 112/72 99 %   05/10/16 1800 - - 117/75 99 %   05/10/16 1730 - - 111/72 100 %   05/10/16 1547 98.9 ??F (37.2 ??C) 24 99/74 100 %       Pulse Oximetry Analysis - 100% on RA      Records Reviewed: Nursing Notes and Old Medical Records    Provider Notes (Medical Decision Making):   DDx: PNA, URI    ED Course:   Initial assessment performed. The patients presenting problems have been discussed, and they are in agreement with the care plan formulated and outlined with them.  I have encouraged them to ask questions as they arise throughout their visit.    Disposition:  DISCHARGE NOTE:  7:18 PM   Pt has been reexamined. Pt has no new complaints, changes, or physical findings. Pt well appearing; afebrile; vs wnl; no increased wbc; due to pregnancy status, I did not feel cxr necessary; dc with zitrhomax; Care plan outlined and precautions discussed. All available results reviewed with pt. All medications reviewed with pt. All of pt???s questions and concerns addressed. Pt agrees to f/u as instructed and agrees to return to ED upon  further deterioration. Pt is ready to go home.  Written by Halina Maidens ED Scribe as dictated by Allen Derry, MD      PLAN:  1.   Current Discharge Medication List        2.   Follow-up Information     Follow up With Details Comments Contact Info    Your OB/GYN Go in 1 day If symptoms worsen     [facility] Go in 1 day If symptoms worsen [place] [id number]  [phone]        Return to ED if worse     Diagnosis     Clinical Impression:   1. Acute bronchitis, unspecified organism    2. [redacted] weeks gestation of pregnancy    3. Hypokalemia        Attestations:    This note is prepared by Eulah Citizen Hutcherson acting as scribe for Allen Derry, MD    Allen Derry, MD : The scribe's documentation has been prepared under my direction and personally reviewed by me in its entirety. I confirm that the note above accurately reflects all work, treatment, procedures, and medical decision making performed by me.

## 2016-05-10 NOTE — ED Triage Notes (Signed)
At completion of triage pt advised to have seat in waiting room and pt states "yall ain't got no beds I can lay on." Explained to pt several pt are being discharged and once I have room available she would get moved as soon as possible.

## 2016-05-10 NOTE — ED Notes (Signed)
Pt arrived ambulatory to room #31 from triage.  Pt with c/o of rhinorrhea and cough x several weeks.  Pt reports that she was seen in ED on 11/23 for the same sx's and dx'd with an URI, and then again on 11/29 and dx'd with a UTI.  Pt notes that she is still experiencing sx's.  Pt also currently [redacted] weeks pregnant.  Pt resting in position of comfort. Call bell within reach.  Pt placed on monitor x 2.

## 2016-05-10 NOTE — ED Notes (Addendum)
Fetal heart tones completed: Fetal heart rate 153.

## 2016-05-10 NOTE — Progress Notes (Signed)
Pt without complaints of dysuria. Diphtheroids likely contaminant. Vital signs normal and pt afebrile. No further treatment indicated.

## 2016-05-10 NOTE — ED Notes (Signed)
Pt ambulatory to restroom to provide urine specimen.

## 2016-05-10 NOTE — ED Notes (Signed)
Bedside shift change report given to Kyen Taite C.  (Cabin crewoncoming nurse) by Joni ReiningNicole (offgoing nurse). Report included the following information SBAR, ED Summary, MAR and Recent Results.

## 2016-05-11 LAB — INFLUENZA A & B AG (RAPID TEST)
Influenza A Antigen: NEGATIVE
Influenza B Antigen: NEGATIVE

## 2016-05-11 MED ORDER — ACETAMINOPHEN-CODEINE 300 MG-30 MG TAB
300-30 mg | ORAL_TABLET | ORAL | 0 refills | Status: DC | PRN
Start: 2016-05-11 — End: 2016-07-08

## 2016-05-11 MED ORDER — AZITHROMYCIN 250 MG TAB
250 mg | ORAL_TABLET | ORAL | 0 refills | Status: DC
Start: 2016-05-11 — End: 2016-07-08

## 2016-05-11 MED FILL — ACETAMINOPHEN-CODEINE 300 MG-30 MG TAB: 300-30 mg | ORAL | Qty: 1

## 2016-05-11 MED FILL — KLOR-CON 20 MEQ ORAL PACKET: 20 mEq | ORAL | Qty: 2

## 2016-05-11 MED FILL — AZITHROMYCIN 250 MG TAB: 250 mg | ORAL | Qty: 2

## 2016-05-12 LAB — CULTURE, URINE
Colonies Counted: >100000
Colony Count: >100000

## 2016-07-08 ENCOUNTER — Inpatient Hospital Stay: Payer: MEDICAID

## 2016-07-08 ENCOUNTER — Inpatient Hospital Stay: Admit: 2016-07-08 | Discharge: 2016-07-08 | Payer: MEDICAID | Attending: Emergency Medicine

## 2016-07-08 DIAGNOSIS — O219 Vomiting of pregnancy, unspecified: Secondary | ICD-10-CM

## 2016-07-08 NOTE — Progress Notes (Signed)
Dr. Maute notified of patient

## 2016-07-08 NOTE — Progress Notes (Signed)
Patient feeling significantly improved s/p 1L LR. No N/V/D since prior to arrival.     Visit Vitals   ??? BP 105/73 (BP 1 Location: Right arm, BP Patient Position: At rest)   ??? Temp 99 ??F (37.2 ??C)   ??? Resp 18   ??? LMP 12/25/2015   ??? SpO2 99%   ??? Breastfeeding No     D/c home  F/u w/ OB as scheduled in 3 days  Reviewed PTL precautions and indications for return    Recent Results (from the past 12 hour(s))   CBC WITH AUTOMATED DIFF    Collection Time: 07/08/16  7:30 PM   Result Value Ref Range    WBC 4.9 3.6 - 11.0 K/uL    RBC 3.81 3.80 - 5.20 M/uL    HGB 10.8 (L) 11.5 - 16.0 g/dL    HCT 30.5 (L) 35.0 - 47.0 %    MCV 80.1 80.0 - 99.0 FL    MCH 28.3 26.0 - 34.0 PG    MCHC 35.4 30.0 - 36.5 g/dL    RDW 14.1 11.5 - 14.5 %    PLATELET 211 150 - 400 K/uL    MPV 10.3 8.9 - 12.9 FL    NRBC 0.0 0 PER 100 WBC    ABSOLUTE NRBC 0.00 0.00 - 0.01 K/uL    NEUTROPHILS 75 32 - 75 %    LYMPHOCYTES 18 12 - 49 %    MONOCYTES 6 5 - 13 %    EOSINOPHILS 1 0 - 7 %    BASOPHILS 0 0 - 1 %    IMMATURE GRANULOCYTES 0 0.0 - 0.5 %    ABS. NEUTROPHILS 3.7 1.8 - 8.0 K/UL    ABS. LYMPHOCYTES 0.9 0.8 - 3.5 K/UL    ABS. MONOCYTES 0.3 0.0 - 1.0 K/UL    ABS. EOSINOPHILS 0.1 0.0 - 0.4 K/UL    ABS. BASOPHILS 0.0 0.0 - 0.1 K/UL    ABS. IMM. GRANS. 0.0 0.00 - 0.04 K/UL    DF AUTOMATED     METABOLIC PANEL, COMPREHENSIVE    Collection Time: 07/08/16  7:31 PM   Result Value Ref Range    Sodium 140 136 - 145 mmol/L    Potassium 3.4 (L) 3.5 - 5.1 mmol/L    Chloride 108 97 - 108 mmol/L    CO2 25 21 - 32 mmol/L    Anion gap 7 5 - 15 mmol/L    Glucose 81 65 - 100 mg/dL    BUN 6 6 - 20 MG/DL    Creatinine 0.50 (L) 0.55 - 1.02 MG/DL    BUN/Creatinine ratio 12 12 - 20      GFR est AA >60 >60 ml/min/1.34m    GFR est non-AA >60 >60 ml/min/1.763m   Calcium 7.9 (L) 8.5 - 10.1 MG/DL    Bilirubin, total 0.4 0.2 - 1.0 MG/DL    ALT (SGPT) 25 12 - 78 U/L    AST (SGOT) 18 15 - 37 U/L    Alk. phosphatase 64 45 - 117 U/L    Protein, total 7.0 6.4 - 8.2 g/dL     Albumin 2.8 (L) 3.5 - 5.0 g/dL    Globulin 4.2 (H) 2.0 - 4.0 g/dL    A-G Ratio 0.7 (L) 1.1 - 2.2     LIPASE    Collection Time: 07/08/16  7:31 PM   Result Value Ref Range    Lipase 76 73 - 393 U/L   AMYLASE  Collection Time: 07/08/16  7:31 PM   Result Value Ref Range    Amylase 115 25 - 115 U/L   URINALYSIS W/ REFLEX CULTURE    Collection Time: 07/08/16  7:31 PM   Result Value Ref Range    Color YELLOW/STRAW      Appearance CLEAR CLEAR      Specific gravity 1.023 1.003 - 1.030      pH (UA) 6.0 5.0 - 8.0      Protein NEGATIVE  NEG mg/dL    Glucose NEGATIVE  NEG mg/dL    Ketone NEGATIVE  NEG mg/dL    Bilirubin NEGATIVE  NEG      Blood NEGATIVE  NEG      Urobilinogen 1.0 0.2 - 1.0 EU/dL    Nitrites NEGATIVE  NEG      Leukocyte Esterase NEGATIVE  NEG      WBC 0-4 0 - 4 /hpf    RBC 0-5 0 - 5 /hpf    Epithelial cells FEW FEW /lpf    Bacteria NEGATIVE  NEG /hpf    UA:UC IF INDICATED PENDING     Hyaline cast 0-2 0 - 5 /lpf   INFLUENZA A & B AG (RAPID TEST)    Collection Time: 07/08/16  7:31 PM   Result Value Ref Range    Influenza A Antigen NEGATIVE  NEG      Influenza B Antigen NEGATIVE  NEG

## 2016-07-08 NOTE — ED Provider Notes (Addendum)
6:13 PM    36 y.o. female [id number] with pregnancy at 3246w0d c/o ~10 hours of N/V/D after eating [employer] and [employer]doba. She presents by ambulance to ED with a c/o right flank pain and N/V.  Pt reports sx began 12 hours ago. Pt reports that this is her 9th pregnancy. Pt specifically denies vaginal bleeding, leakage of fluid; urinary symptoms, or vaginal discharge. Fetal movement in 24 hrs?: decreased. Additionally, denies fevers, chills, chest pain, SOB. No known exposure to flu.     Gravid 9 Para 5  LMP: ~ 29 weeks ago  approximately [redacted] weeks pregnant  Hx of prior: none    PMHx is significant for: Asthma  PSHx is significant for: non  Social Hx: +(.25 ppd) Tobacco, - EtOH, - Illicit Drugs    Is blood pressure high?: no  Swelling in legs?: no    There are no other sx???s or complaints noted at this time.     ROS:  1) + right flank pain  2) + N/V  3) +diarrhea  All other Systems Negative    No data found.      PE:  General appearance - well nourished, well appearing, and in no distress  Eyes - pupils equal and reactive, extraocular eye movements intact  ENT - mucous membranes moist, pharynx normal without lesions  Neck - supple, no significant adenopathy;   Chest - clear to auscultation, no wheezes, rales or rhonchi; non-tender to palpation  Heart - normal rate and regular rhythm, S1 and S2 normal, no murmurs noted  Abdomen - soft, nontender, nondistended, Gravid Uterus, mild right flank tenderness and CVA tenderness  Musculoskeletal - no joint tenderness, deformity or swelling; normal ROM  Extremities - peripheral pulses normal, no pedal edema  Skin - normal coloration and turgor, no rashes  Neurological - alert, oriented x3, normal speech, no focal findings or movement disorder noted    Provider Notes:  DDx: UTI, pyelo, electrolyte disturbance, viral illness. Pt is a 36 y.o. Female that is 7 months pregnant, G9P6, presenting with right flank pain and N/V. Stable vitals, no evidence of precipitous delivery. Pt is safe to  transfer to L&D for further work-up.    TOBACCO COUNSELING:  Upon evaluation, pt expressed that they are a current tobacco user. For approximately 10 minutes, pt has been counseled on the dangers of smoking and was encouraged to quit as soon as possible in order to decrease further risks to their health. Pt has conveyed their understanding of the risks involved should they continue to use tobacco products.      IMPRESSION:    ICD-10-CM ICD-9-CM   1. Nausea and vomiting in pregnancy O21.9 643.90   2. Abdominal pain, generalized R10.84 789.07   3. [redacted] weeks gestation of pregnancy Z3A.28 V22.2         PLAN:  1. Sent to Labor and Delivery.     Attestation:  This note is prepared by Arlyn DunningKatori Jones, acting as Scribe for Jed LimerickHenry Mirka Barbone, MD.    Jed LimerickHenry Ronalee Scheunemann, MD: The scribe's documentation has been prepared under my direction and personally reviewed by me in its entirety. I confirm that the note above accurately reflects all work, treatment, procedures, and medical decision making performed by me.    This note will not be viewable in MyChart.

## 2016-07-08 NOTE — Progress Notes (Addendum)
1820 G9P5 arrived via wheelchair from ER with complaints of nausea, vomitting and diarrhea since 6am today.  EFMx2 applied fetal heart tones found in left lower abdomen. Positive fetal movement reported by pt.    1910 Dr Maute at [facility] perfomed pt closed, thick and high.    1920 Dr Marla RoeMaute out of room    1925 IV started in right posterior forearm, LR hung and infusing per md orders.    1945 Ice chips given. Pt tolerating without difficutly.    2000 Bedside report given to A. Amuquandoh RN and care relinqueshed.

## 2016-07-08 NOTE — H&P (Signed)
History & Physical    Name: Connie Morgan MRN: [id number]  SSN: [id number]    Date of Birth: May 16, 1981  Age: 36 y.o.  Sex: female        Subjective:     Estimated Date of Delivery: 09/30/16  OB History   Gravida Para Term Preterm AB Living   1        SAB TAB Ectopic Molar Multiple Live Births              # Outcome Date GA Lbr Len/2nd Weight Sex Delivery Anes PTL Lv   1 Current                 Chief Complaint: nausea and vomiting    Connie Morgan is a 36 y/o [phone] with pregnancy at [redacted]w[redacted]d c/o ~10 hours of N/V/D after eating [employer] and [employer]. Now having some associated cramping. Excellent fetal movement. Denies leakage of fluid or vaginal bleeding. Denies F/C/CP/SOB/congestion. Her children had flu shots but she did not. Denies any known exposure to flu. No urinary symptoms - dysuria, urgency, frequency.    Reports routine uncomplicated prenatal care with a [facility]. Her next appointment is in 3 days. FT SVD x5    Past Medical History:   Diagnosis Date   ??? Asthma      No past surgical history on file.  Social History     Occupational History   ??? Not on file.     Social History Main Topics   ??? Smoking status: Former Smoker     Packs/day: 0.25   ??? Smokeless tobacco: Never Used   ??? Alcohol use No   ??? Drug use: No   ??? Sexual activity: Yes     Partners: Male      Comment: Unprotected sex with one person x 1 year.      No family history on file.  No Known Allergies  Prior to Admission medications    Medication Sig Start Date End Date Taking? Authorizing Provider   azithromycin (ZITHROMAX Z-PAK) 250 mg tablet Take as directed 05/10/16   Delena Bali, MD   acetaminophen-codeine (TYLENOL #3) 300-30 mg per tablet Take 1 Tab by mouth every four (4) hours as needed for Pain. Max Daily Amount: 6 Tabs. 05/10/16   Delena Bali, MD   ondansetron (ZOFRAN ODT) 4 mg disintegrating tablet Take 1 Tab by mouth every eight (8) hours as needed for Nausea. 05/04/16   Gabriella R. [place], [place]    acetaminophen (TYLENOL) 325 mg tablet Take 2 Tabs by mouth every four (4) hours as needed for Pain. 05/04/16   Gabriella R. [place], [place]   prenatal multivit-ca-min-fe-fa (PRENATAL VITAMIN) tab Take 1 Tab by mouth daily. 01/27/16   April N O'Bier, MD        Review of Systems   Gastrointestinal: Positive for abdominal pain (lower abdominal cramping), diarrhea, nausea and vomiting. Negative for abdominal distention, anal bleeding, blood in stool, constipation and rectal pain.   All other systems reviewed and are negative.      Objective:     Vitals:  There were no vitals filed for this visit.     Physical Exam   Constitutional: She is oriented to person, place, and time. She appears well-developed and well-nourished. No distress.   HENT:   Head: Normocephalic and atraumatic.   Neck: Normal range of motion. Neck supple.   Cardiovascular: Normal rate, regular rhythm and normal heart sounds.  Exam reveals no gallop and no friction  rub.    No murmur heard.  Pulmonary/Chest: Effort normal and breath sounds normal. No respiratory distress. She has no wheezes. She has no rales. She exhibits no tenderness.   Abdominal: Soft. Bowel sounds are normal. She exhibits no distension and no mass. There is no tenderness. There is no rebound, no guarding and no CVA tenderness.   Gravid, soft, nontender   Genitourinary: Vagina normal. No labial fusion. There is no rash, tenderness, lesion or injury on the right labia. There is no rash, tenderness, lesion or injury on the left labia. Uterus is enlarged (gravid). Uterus is not tender. No erythema, tenderness or bleeding in the vagina. No foreign body in the vagina. No signs of injury around the vagina. No vaginal discharge found.   Musculoskeletal: Normal range of motion. She exhibits no edema, tenderness or deformity.   Neurological: She is alert and oriented to person, place, and time.   Skin: Skin is warm and dry. She is not diaphoretic. No erythema.    Psychiatric: She has a normal mood and affect. Her behavior is normal.       Cervical Exam: closed/thick/high  Uterine Activity: none  Membranes: intact  Fetal Heart Rate: 150, category 1    Prenatal Labs:   No results found for: ABORH, RUBELLAEXT, GRBSEXT, HBSAGEXT, HIVEXT, RPREXT, GONNOEXT, CHLAMEXT, ABORHEXT, RUBELLAEXT, GRBSEXT, HBSAGEXT, HIVEXT, RPREXT, GONNOEXT, CHLAMEXT       Impression/Plan:     36 y/o [id number] @ 28/0 weeks    Nausea/vomiting/diarrhea    IV hydration   Antiemetics prn   Labs pending    Cramping   Likely related to above   Cervical exam reassuring    Signed By:  Ardell IsaacsJessica L Ruairi Stutsman, MD     July 08, 2016

## 2016-07-08 NOTE — Progress Notes (Addendum)
[facility] and care assumed. Report given with SBAR , recent labs, last cervical exam  and MAR . Fluid bolus in progress. Pt states feeling fetal movement     2040 pt up to void     2100 pt laughing and talking on phone , no c/o of nausea or vomiting noted or voiced by pt .Unable to get continuous tracing because pt talking moving and laughing    2145 labs reviewed by Dr. Marla RoeMaute and order received for discharge . Discharge instructions reviewed with pt . States follow up appointment scheduled for 2/5.     2155 pt left ambulatory accompanied by Visitor

## 2016-07-09 LAB — METABOLIC PANEL, COMPREHENSIVE
A-G Ratio: 0.7 — ABNORMAL LOW (ref 1.1–2.2)
ALT (SGPT): 25 U/L (ref 12–78)
AST (SGOT): 18 U/L (ref 15–37)
Albumin: 2.8 g/dL — ABNORMAL LOW (ref 3.5–5.0)
Alk. phosphatase: 64 U/L (ref 45–117)
Anion gap: 7 mmol/L (ref 5–15)
BUN/Creatinine ratio: 12 (ref 12–20)
BUN: 6 MG/DL (ref 6–20)
Bilirubin, total: 0.4 MG/DL (ref 0.2–1.0)
CO2: 25 mmol/L (ref 21–32)
Calcium: 7.9 MG/DL — ABNORMAL LOW (ref 8.5–10.1)
Chloride: 108 mmol/L (ref 97–108)
Creatinine: 0.5 MG/DL — ABNORMAL LOW (ref 0.55–1.02)
GFR est AA: >60 mL/min/{1.73_m2} (ref >60)
GFR est non-AA: >60 mL/min/{1.73_m2} (ref >60)
Globulin: 4.2 g/dL — ABNORMAL HIGH (ref 2.0–4.0)
Glucose: 81 mg/dL (ref 65–100)
Potassium: 3.4 mmol/L — ABNORMAL LOW (ref 3.5–5.1)
Protein, total: 7 g/dL (ref 6.4–8.2)
Sodium: 140 mmol/L (ref 136–145)

## 2016-07-09 LAB — CBC WITH AUTOMATED DIFF
ABS. BASOPHILS: 0 10*3/uL (ref 0.0–0.1)
ABS. EOSINOPHILS: 0.1 10*3/uL (ref 0.0–0.4)
ABS. IMM. GRANS.: 0 10*3/uL (ref 0.00–0.04)
ABS. LYMPHOCYTES: 0.9 10*3/uL (ref 0.8–3.5)
ABS. MONOCYTES: 0.3 10*3/uL (ref 0.0–1.0)
ABS. NEUTROPHILS: 3.7 10*3/uL (ref 1.8–8.0)
ABSOLUTE NRBC: 0 10*3/uL (ref 0.00–0.01)
BASOPHILS: 0 % (ref 0–1)
EOSINOPHILS: 1 % (ref 0–7)
HCT: 30.5 % — ABNORMAL LOW (ref 35.0–47.0)
HGB: 10.8 g/dL — ABNORMAL LOW (ref 11.5–16.0)
IMMATURE GRANULOCYTES: 0 % (ref 0.0–0.5)
LYMPHOCYTES: 18 % (ref 12–49)
MCH: 28.3 PG (ref 26.0–34.0)
MCHC: 35.4 g/dL (ref 30.0–36.5)
MCV: 80.1 FL (ref 80.0–99.0)
MONOCYTES: 6 % (ref 5–13)
MPV: 10.3 FL (ref 8.9–12.9)
NEUTROPHILS: 75 % (ref 32–75)
NRBC: 0 PER 100 WBC
PLATELET: 211 10*3/uL (ref 150–400)
RBC: 3.81 M/uL (ref 3.80–5.20)
RDW: 14.1 % (ref 11.5–14.5)
WBC: 4.9 10*3/uL (ref 3.6–11.0)

## 2016-07-09 LAB — URINALYSIS W/ REFLEX CULTURE
Bacteria: NEGATIVE /hpf
Bilirubin: NEGATIVE
Blood: NEGATIVE
Glucose: NEGATIVE mg/dL
Ketone: NEGATIVE mg/dL
Leukocyte Esterase: NEGATIVE
Nitrites: NEGATIVE
Protein: NEGATIVE mg/dL
Specific gravity: 1.023 (ref 1.003–1.030)
Urobilinogen: 1 EU/dL (ref 0.2–1.0)
pH (UA): 6 (ref 5.0–8.0)

## 2016-07-09 LAB — INFLUENZA A & B AG (RAPID TEST)
Influenza A Antigen: NEGATIVE
Influenza B Antigen: NEGATIVE

## 2016-07-09 LAB — AMYLASE: Amylase: 115 U/L (ref 25–115)

## 2016-07-09 LAB — LIPASE: Lipase: 76 U/L (ref 73–393)

## 2016-07-09 MED ORDER — LACTATED RINGERS IV
INTRAVENOUS | Status: DC
Start: 2016-07-09 — End: 2016-07-09
  Administered 2016-07-09: via INTRAVENOUS

## 2016-07-09 MED FILL — LACTATED RINGERS IV: INTRAVENOUS | Qty: 1000

## 2016-08-14 ENCOUNTER — Inpatient Hospital Stay
Admit: 2016-08-14 | Discharge: 2016-08-14 | Disposition: A | Discharge status: Home / Self Care | Payer: MEDICAID | Attending: Emergency Medicine

## 2016-08-14 DIAGNOSIS — O26893 Other specified pregnancy related conditions, third trimester: Secondary | ICD-10-CM

## 2016-08-14 LAB — URINALYSIS W/ REFLEX CULTURE
Bilirubin: NEGATIVE
Blood: NEGATIVE
Glucose: NEGATIVE mg/dL
Ketone: NEGATIVE mg/dL
Nitrites: NEGATIVE
Protein: NEGATIVE mg/dL
Specific gravity: 1.006 (ref 1.003–1.030)
Urobilinogen: 0.2 EU/dL (ref 0.2–1.0)
pH (UA): 6 (ref 5.0–8.0)

## 2016-08-14 MED ORDER — LOPERAMIDE 2 MG CAP
2 mg | ORAL | Status: AC
Start: 2016-08-14 — End: 2016-08-14
  Administered 2016-08-14: 17:00:00 via ORAL

## 2016-08-14 MED ORDER — ONDANSETRON 4 MG TAB, RAPID DISSOLVE
4 mg | ORAL | Status: AC
Start: 2016-08-14 — End: 2016-08-14
  Administered 2016-08-14: 17:00:00 via ORAL

## 2016-08-14 MED ORDER — ONDANSETRON 8 MG TAB, RAPID DISSOLVE
8 mg | ORAL_TABLET | Freq: Three times a day (TID) | ORAL | 0 refills | Status: DC | PRN
Start: 2016-08-14 — End: 2019-07-12

## 2016-08-14 MED ORDER — LOPERAMIDE 2 MG CAP
2 mg | ORAL_CAPSULE | Freq: Four times a day (QID) | ORAL | 0 refills | Status: AC | PRN
Start: 2016-08-14 — End: 2016-08-24

## 2016-08-14 MED FILL — ONDANSETRON 4 MG TAB, RAPID DISSOLVE: 4 mg | ORAL | Qty: 2

## 2016-08-14 MED FILL — LOPERAMIDE 2 MG CAP: 2 mg | ORAL | Qty: 1

## 2016-08-14 NOTE — Progress Notes (Signed)
NST preformed, Reactive, baseline of 150bpm, No contractions monitored, patient denies contractions, Positive fetal movement  Floyde ParkinsAllison Autrey RN, L&D

## 2016-08-14 NOTE — ED Notes (Signed)
MD Rudd reviewed discharge instructions with the patient.  The patient verbalized understanding.  Pt left alert,oriented, and ambulatory.  pt is driving self home.

## 2016-08-14 NOTE — ED Notes (Signed)
Pt has had more than 10 episodes of diarrhea, reports it was brown in color and now is more tanish/gray in color.  Pt reports she ate a home-made burger and nuggets then 30 min later she began to have stomach cramps.  Pt vomited twice last night.  Pt has not taken any OTC medicine.  Pt reports she had food poisoning in the past.  Pt has not been on any new medications.

## 2016-08-14 NOTE — ED Notes (Signed)
Dr Veto Kempsudd [facility], urine sent, pt roomed

## 2016-08-14 NOTE — ED Notes (Signed)
Pt back to restroom

## 2016-08-14 NOTE — ED Notes (Signed)
NST normal.

## 2016-08-14 NOTE — ED Notes (Signed)
Case d/w MD no collection of stool needed.

## 2016-08-14 NOTE — ED Notes (Signed)
nonfetal stress test being done by L&D nurse.    Pt in restroom having a BM.  First time since zofran and imodium

## 2016-08-14 NOTE — ED Triage Notes (Signed)
Dr Veto Kempsudd notified to come to triage to assess patient to determine if patient should go to L & D or be evaluated in the ED

## 2016-08-14 NOTE — ED Notes (Signed)
Called L [facility] who [facility] have the patient remain in the ED -- Dr Veto Kempsudd notified

## 2016-08-14 NOTE — ED Provider Notes (Signed)
EMERGENCY DEPARTMENT HISTORY AND PHYSICAL EXAM      Date: 08/14/2016  Patient Name: Connie Morgan    History of Presenting Illness     Chief Complaint   Patient presents with   ??? Abdominal Pain     pt reports to ed complaining of lower abdominal pain onset today, pt is [redacted] weeks pregnant EDD 09/30/16   ??? Diarrhea     onset this morning       History Provided By: Patient    HPI: Connie Morgan is a [id number], 32 week preganant 36 y.o. female with PMHx significant for asthma, presents ambulatory to the ED with cc of sudden onset N/VD with associated diffuse abd cramping x this morning. Pt has had more than 10 episodes of diarrhea today. She reports that she ate hamburgers and chicken nuggets yesterday and is concerned that her sxs are food poisoning, as they feel similar to food poisoning episodes in the past. Pt has not taken any OTC medications for her sxs. No vaginal bleeding/discharge. States that this does not feel like labor.    PCP: Phys Other, MD    There are no other complaints, changes, or physical findings at this time.    Current Outpatient Prescriptions   Medication Sig Dispense Refill   ??? loperamide (IMODIUM) 2 mg capsule Take 1 Cap by mouth four (4) times daily as needed for Diarrhea for up to 10 days. 20 Cap 0   ??? ondansetron (ZOFRAN ODT) 8 mg disintegrating tablet Take 1 Tab by mouth every eight (8) hours as needed for Nausea. 6 Tab 0   ??? sertraline (ZOLOFT) 100 mg tablet Take 100 mg by mouth daily. Indications: ANXIETY WITH DEPRESSION     ??? prenatal multivit-ca-min-fe-fa (PRENATAL VITAMIN) tab Take 1 Tab by mouth daily. 30 Tab 0       Past History     Past Medical History:  Past Medical History:   Diagnosis Date   ??? Asthma        Past Surgical History:  No past surgical history on file.    Family History:  Family History   Problem Relation Age of Onset   ??? No Known Problems Mother    ??? No Known Problems Father        Social History:  Social History   Substance Use Topics   ??? Smoking status: Former Smoker      Packs/day: 0.25   ??? Smokeless tobacco: Never Used   ??? Alcohol use No       Allergies:  No Known Allergies      Review of Systems   Review of Systems   Constitutional: Negative for chills and fever.   HENT: Negative for congestion and sore throat.    Eyes: Negative for visual disturbance.   Respiratory: Negative for cough and shortness of breath.    Cardiovascular: Negative for chest pain and leg swelling.   Gastrointestinal: Positive for abdominal pain (diffuse), diarrhea, nausea and vomiting. Negative for blood in stool.   Endocrine: Negative for polyuria.   Genitourinary: Negative for dysuria, flank pain, vaginal bleeding and vaginal discharge.   Musculoskeletal: Negative for myalgias.   Skin: Negative for rash.   Allergic/Immunologic: Negative for immunocompromised state.   Neurological: Negative for weakness and headaches.   Psychiatric/Behavioral: Negative for confusion.       Physical Exam   Physical Exam   Constitutional: She is oriented to person, place, and time. She appears well-developed and well-nourished.   HENT:  Head: Normocephalic and atraumatic.   Moist mucous membranes   Eyes: Conjunctivae are normal. Pupils are equal, round, and reactive to light. Right eye exhibits no discharge. Left eye exhibits no discharge.   Neck: Normal range of motion. Neck supple. No tracheal deviation present.   Cardiovascular: Normal rate, regular rhythm and normal heart sounds.    No murmur heard.  Pulmonary/Chest: Effort normal and breath sounds normal. No respiratory distress. She has no wheezes. She has no rales.   Abdominal: Soft. Bowel sounds are normal. There is no tenderness. There is no rebound and no guarding.   Genitourinary:   Genitourinary Comments: Gravid uterus c/w with dates   Musculoskeletal: Normal range of motion. She exhibits no edema, tenderness or deformity.   Neurological: She is alert and oriented to person, place, and time.   Skin: Skin is warm and dry. No rash noted. No erythema.    Psychiatric: Her behavior is normal.   Nursing note and vitals reviewed.        Diagnostic Study Results     Labs -     Recent Results (from the past 12 hour(s))   URINALYSIS W/ REFLEX CULTURE    Collection Time: 08/14/16 12:53 PM   Result Value Ref Range    Color YELLOW/STRAW      Appearance CLOUDY (A) CLEAR      Specific gravity 1.006 1.003 - 1.030      pH (UA) 6.0 5.0 - 8.0      Protein NEGATIVE  NEG mg/dL    Glucose NEGATIVE  NEG mg/dL    Ketone NEGATIVE  NEG mg/dL    Bilirubin NEGATIVE  NEG      Blood NEGATIVE  NEG      Urobilinogen 0.2 0.2 - 1.0 EU/dL    Nitrites NEGATIVE  NEG      Leukocyte Esterase MODERATE (A) NEG      WBC 5-10 0 - 4 /hpf    RBC 0-5 0 - 5 /hpf    Epithelial cells MODERATE (A) FEW /lpf    Bacteria 1+ (A) NEG /hpf    UA:UC IF INDICATED URINE CULTURE ORDERED (A) CNI         Medical Decision Making   I am the first provider for this patient.    I reviewed the vital signs, available nursing notes, past medical history, past surgical history, family history and social history.    Vital Signs-Reviewed the patient's vital signs.  Patient Vitals for the past 12 hrs:   Temp Pulse Resp BP SpO2   08/14/16 1232 98.7 ??F (37.1 ??C) 77 16 123/90 100 %     Records Reviewed: Nursing Notes and Old Medical Records    Provider Notes (Medical Decision Making):   Most c/w viral gastroenteritis. At this time does not appear to be active labor no vaginal bleeding or discharge, and she states that it does not feel like labor. Will consult OBGYN and gets labs to further evaluate.    ED Course:   Initial assessment performed. The patients presenting problems have been discussed, and they are in agreement with the care plan formulated and outlined with them.  I have encouraged them to ask questions as they arise throughout their visit.    2:08 PM   Pt is tolerating PO.  Written by Virgina Evener ED Scribe as dictated by Marylu Lund, DO    Disposition:  DISCHARGE NOTE:  2:17 PM   Pt has been reexamined. Pt has no new complaints,  changes, or physical findings. Care plan outlined and precautions discussed. All available results reviewed with pt. All medications reviewed with pt. All of pt???s questions and concerns addressed. Pt agrees to f/u as instructed and agrees to return to ED upon further deterioration. Pt is ready to go home.  Written by Virgina EvenerEmily K Hutcherson ED Scribe as dictated by Marylu LundAaron Mikaylee Arseneau, DO    PLAN:  1.   Current Discharge Medication List      START taking these medications    Details   loperamide (IMODIUM) 2 mg capsule Take 1 Cap by mouth four (4) times daily as needed for Diarrhea for up to 10 days.  Qty: 20 Cap, Refills: 0      ondansetron (ZOFRAN ODT) 8 mg disintegrating tablet Take 1 Tab by mouth every eight (8) hours as needed for Nausea.  Qty: 6 Tab, Refills: 0           2.   Follow-up Information     Follow up With Details Comments Contact Info    Your Ob/gyn Call in 1 day      [facility]  If symptoms worsen [address]  [place] [id number]  [phone]        Return to ED if worse     Diagnosis     Clinical Impression:   1. Non-intractable vomiting with nausea, unspecified vomiting type    2. Diarrhea, unspecified type        Attestations:    This note is prepared by Pricilla LovelessEmily K Hutcherson acting as scribe for Marylu LundAaron Jermiah Soderman, DO    Marylu LundAaron Marce Charlesworth, DO : The scribe's documentation has been prepared under my direction and personally reviewed by me in its entirety. I confirm that the note above accurately reflects all work, treatment, procedures, and medical decision making performed by me.

## 2016-08-14 NOTE — ED Notes (Signed)
Pt has had 3 episodes since arrival of diarrhea.  Specimen hat placed in toilet to obtain specimen.

## 2016-08-16 LAB — CULTURE, URINE
Colonies Counted: >100000
Colony Count: >100000

## 2017-06-09 ENCOUNTER — Inpatient Hospital Stay
Admit: 2017-06-09 | Discharge: 2017-06-09 | Disposition: A | Discharge status: Home / Self Care | Payer: MEDICARE | Attending: Emergency Medicine

## 2017-06-09 DIAGNOSIS — S29011A Strain of muscle and tendon of front wall of thorax, initial encounter: Secondary | ICD-10-CM

## 2017-06-09 MED ORDER — METHOCARBAMOL 500 MG TAB
500 mg | Freq: Once | ORAL | Status: AC
Start: 2017-06-09 — End: 2017-06-09
  Administered 2017-06-09: 16:00:00 via ORAL

## 2017-06-09 MED ORDER — KETOROLAC TROMETHAMINE 30 MG/ML INJECTION
30 mg/mL (1 mL) | INTRAMUSCULAR | Status: AC
Start: 2017-06-09 — End: 2017-06-09
  Administered 2017-06-09: 16:00:00 via INTRAMUSCULAR

## 2017-06-09 MED ORDER — IBUPROFEN 600 MG TAB
600 mg | ORAL_TABLET | Freq: Three times a day (TID) | ORAL | 0 refills | Status: AC | PRN
Start: 2017-06-09 — End: 2017-06-16

## 2017-06-09 MED ORDER — LIDOCAINE 5 % (700 MG/PATCH) ADHESIVE PATCH
5 % | CUTANEOUS | Status: DC
Start: 2017-06-09 — End: 2017-06-09

## 2017-06-09 MED ORDER — METHOCARBAMOL 750 MG TAB
750 mg | ORAL_TABLET | Freq: Four times a day (QID) | ORAL | 0 refills | Status: AC
Start: 2017-06-09 — End: ?

## 2017-06-09 MED ORDER — LIDOCAINE 5 % (700 MG/PATCH) ADHESIVE PATCH
5 % | MEDICATED_PATCH | CUTANEOUS | 0 refills | Status: AC
Start: 2017-06-09 — End: ?

## 2017-06-09 MED FILL — KETOROLAC TROMETHAMINE 30 MG/ML INJECTION: 30 mg/mL (1 mL) | INTRAMUSCULAR | Qty: 2

## 2017-06-09 MED FILL — METHOCARBAMOL 500 MG TAB: 500 mg | ORAL | Qty: 3

## 2017-06-09 MED FILL — LIDOCAINE 5 % (700 MG/PATCH) ADHESIVE PATCH: 5 % | CUTANEOUS | Qty: 1

## 2017-06-09 NOTE — ED Notes (Signed)
Patient verbalizes understanding of discharge instructions given by provider, Vernona RiegerLaura, [place]. Opportunity for questions provided. Patient in no apparent distress. Patient ambulatory upon discharge.   Visit Vitals  BP 115/70 (BP 1 Location: Left arm, BP Patient Position: At rest;Sitting)   Pulse 77   Temp 97.8 ??F (36.6 ??C)   Resp 15   SpO2 99%

## 2017-06-09 NOTE — ED Provider Notes (Signed)
Connie MallingSandra Culliton is a 37 y.o. female who presents ambulatory to ER with c/o Left lateral rib/chest wall pain x just PTA. Pt states she was taking her son to the doctor and when she bent down to pick up his car seat she felt a sharp pull in her left chest and rib cage with acute onset pain that has been constant since that time. Notes pain is worse with movement and deep breathing. Has not taken anything for the pain. No SOB or CP. No other complaints.   She specifically denies any fevers, chills, nausea, vomiting, chest pain, shortness of breath, headache, rash, diarrhea, abdominal pain, urinary/bowel changes, sweating or weight loss.    PCP: Other, Phys, MD   PMHx significant for: Past Medical History:  No date: Asthma  PSHx significant for: History reviewed. No pertinent surgical history.       No Known Allergies    There are no other complaints, changes or physical findings at this time.                Past Medical History:   Diagnosis Date   ??? Asthma        History reviewed. No pertinent surgical history.      Family History:   Problem Relation Age of Onset   ??? No Known Problems Mother    ??? No Known Problems Father        Social History     Socioeconomic History   ??? Marital status: SINGLE     Spouse name: Not on file   ??? Number of children: Not on file   ??? Years of education: Not on file   ??? Highest education level: Not on file   Social Needs   ??? Financial resource strain: Not on file   ??? Food insecurity - worry: Not on file   ??? Food insecurity - inability: Not on file   ??? Transportation needs - medical: Not on file   ??? Transportation needs - non-medical: Not on file   Occupational History   ??? Not on file   Tobacco Use   ??? Smoking status: Former Smoker     Packs/day: 0.25   ??? Smokeless tobacco: Never Used   Substance and Sexual Activity   ??? Alcohol use: No   ??? Drug use: No   ??? Sexual activity: Yes     Partners: Male     Comment: Unprotected sex with one person x 1 year.    Other Topics Concern   ??? Not on file    Social History Narrative   ??? Not on file         ALLERGIES: Patient has no known allergies.    Review of Systems   Constitutional: Negative.    HENT: Negative.    Eyes: Negative.    Respiratory: Negative.    Cardiovascular: Negative.    Gastrointestinal: Negative.    Genitourinary: Negative.    Musculoskeletal: Positive for myalgias (L rib pain).   Skin: Negative.    Neurological: Negative.    Hematological: Negative.    Psychiatric/Behavioral: Negative.    All other systems reviewed and are negative.      Vitals:    06/09/17 1020 06/09/17 1044   BP:  115/70   Pulse: 70 77   Resp:  15   Temp:  97.8 ??F (36.6 ??C)   SpO2: 99% 99%            Physical Exam   Constitutional: She is oriented  to person, place, and time. She appears well-developed and well-nourished. She appears distressed (uncomfortable appearing).   HENT:   Head: Normocephalic and atraumatic.   Neck: Normal range of motion.   Cardiovascular: Intact distal pulses and normal pulses.   Pulmonary/Chest: Effort normal and breath sounds normal. No accessory muscle usage or stridor. No respiratory distress. She has no decreased breath sounds. She has no wheezes. She has no rhonchi. She has no rales.         She exhibits tenderness.   Musculoskeletal: Normal range of motion. She exhibits no edema or tenderness.   Neurological: She is alert and oriented to person, place, and time. She has normal strength. No sensory deficit.   Skin: Skin is warm and dry. No rash noted. She is not diaphoretic. No erythema. No pallor.   Psychiatric: She has a normal mood and affect. Her behavior is normal.   Nursing note and vitals reviewed.       MDM  Number of Diagnoses or Management Options  Diagnosis management comments: DDx: strain, sprain, spasm       Amount and/or Complexity of Data Reviewed  Discuss the patient with other providers: yes    Patient Progress  Patient progress: stable         Procedures         10:50 AM    Zadie Rhine, PA-C discussed patient with Verl Bangs, MD who is in agreement with care plan as outlined.No further recommendations. Zadie Rhine, PA-C             11:01 AM  Pt has been reevaluated. There are no new complaints, changes, or physical findings at this time. Medications have been reviewed w/ pt and/or family. Pt and/or family's questions have been answered. Pt and/or family expressed good understanding of the dx/tx/rx and is in agreement with plan of care. Pt instructed and agreed to f/u w/ PCP and to return to ED upon further deterioration. Pt is ready for discharge.    LABORATORY TESTS:  No results found for this or any previous visit (from the past 12 hour(s)).    IMAGING RESULTS:  No orders to display     No results found.      MEDICATIONS GIVEN:  Medications   lidocaine (LIDODERM) 5 % patch 1 Patch (1 Patch TransDERmal Apply Patch 06/09/17 1057)   methocarbamol (ROBAXIN) tablet 1,500 mg (1,500 mg Oral Given 06/09/17 1058)   ketorolac (TORADOL) injection 60 mg (60 mg IntraMUSCular Given 06/09/17 1058)       IMPRESSION:  1. Intercostal muscle strain, initial encounter    2. Rib pain        PLAN:  1.   Current Discharge Medication List      START taking these medications    Details   methocarbamol (ROBAXIN) 750 mg tablet Take 1 Tab by mouth four (4) times daily.  Qty: 20 Tab, Refills: 0      ibuprofen (MOTRIN) 600 mg tablet Take 1 Tab by mouth every eight (8) hours as needed for Pain for up to 7 days.  Qty: 20 Tab, Refills: 0      lidocaine (LIDODERM) 5 % Apply patch to the affected area for 12 hours a day and remove for 12 hours a day.  Qty: 10 Patch, Refills: 0         CONTINUE these medications which have NOT CHANGED    Details   ondansetron (ZOFRAN ODT) 8 mg disintegrating tablet Take 1 Tab  by mouth every eight (8) hours as needed for Nausea.  Qty: 6 Tab, Refills: 0      sertraline (ZOLOFT) 100 mg tablet Take 100 mg by mouth daily. Indications: ANXIETY WITH DEPRESSION       prenatal multivit-ca-min-fe-fa (PRENATAL VITAMIN) tab Take 1 Tab by mouth daily.  Qty: 30 Tab, Refills: 0           2.   Follow-up Information     Follow up With Specialties Details Why Contact Info    Other, Phys, MD  Schedule an appointment as soon as possible for a visit in 3 days  Patient can only remember the practice name and not the physician      [facility] EMERGENCY DEP Emergency Medicine  If symptoms worsen [address]  [place] [address]  [phone]        3    Return to ED if worse

## 2017-06-09 NOTE — ED Triage Notes (Signed)
QUICK LOOK: Patient walking her child to an appointment when something "ripped". Pain to L side of ribs

## 2019-07-12 ENCOUNTER — Inpatient Hospital Stay: Admit: 2019-07-12 | Discharge: 2019-07-13 | Disposition: A | Discharge status: Home / Self Care | Payer: MEDICARE

## 2019-07-12 ENCOUNTER — Emergency Department: Admit: 2019-07-13 | Payer: MEDICARE | Primary: Family Medicine

## 2019-07-12 DIAGNOSIS — R519 Headache, unspecified: Secondary | ICD-10-CM

## 2019-07-12 MED ORDER — SODIUM CHLORIDE 0.9% BOLUS IV
0.9 % | Freq: Once | INTRAVENOUS | Status: AC
Start: 2019-07-12 — End: 2019-07-12
  Administered 2019-07-13: 01:00:00 via INTRAVENOUS

## 2019-07-12 MED FILL — SODIUM CHLORIDE 0.9 % IV: INTRAVENOUS | Qty: 1000

## 2019-07-12 NOTE — ED Provider Notes (Signed)
ED Provider Notes by Molli Barrowsng-Pham, Bona Hubbard T, PA at 07/12/19 1903                Author: Molli Barrowsng-Pham, Niklaus Mamaril T, PA  Service: --  Author Type: Physician Assistant       Filed: 07/12/19 2344  Date of Service: 07/12/19 1903  Status: Attested           Editor: Molli Barrowsng-Pham, Krzysztof Reichelt T, PA (Physician Assistant)  Cosigner: Marcelyn BruinsZelensky, Paul K, MD at 07/13/19 1424          Attestation signed by Marcelyn BruinsZelensky, Paul K, MD at 07/13/19 1424          I was available in the department for consultation during evaluation.  I reviewed the APP's note.  Agree with documentation including assessment and plan.                                    EMERGENCY DEPARTMENT HISTORY AND PHYSICAL EXAM           Date: 07/12/2019   Patient Name: Connie MallingSandra Morgan        History of Presenting Illness          Chief Complaint       Patient presents with        ?  Headache           History Provided By: Patient      HPI: Connie MallingSandra Morgan,  39 y.o. female with a past medical history significant  for asthma who presents to the ED with cc of gradual onset, gradually worsening, constant headache located to frontal head which started at 2 PM today.  No particular exacerbating symptoms.  Slightly  alleviated with Motrin.  Associated symptoms include nausea, 5 episodes of emesis today, and myalgias.  Patient works at [employer] and is possible COVID-19 exposure.  Patient states this is the worst headache she has ever had. Patient denies fever,  chills, chest pain, shortness of breath, diarrhea, dizziness, lightheadedness, photophobia, visual changes (diplopia, blurred), numbness, tingling, one-sided weakness, speech difficulty, gait problems, neck pain, neck stiffness, eye pain         There are no other complaints, changes, or physical findings at this time.      PCP: Other, Phys, MD        No current facility-administered medications on file prior to encounter.           Current Outpatient Medications on File Prior to Encounter          Medication  Sig  Dispense  Refill           ?   methocarbamol (ROBAXIN) 750 mg tablet  Take 1 Tab by mouth four (4) times daily.  20 Tab  0     ?  lidocaine (LIDODERM) 5 %  Apply patch to the affected area for 12 hours a day and remove for 12 hours a day.  10 Patch  0     ?  [DISCONTINUED] ondansetron (ZOFRAN ODT) 8 mg disintegrating tablet  Take 1 Tab by mouth every eight (8) hours as needed for Nausea.  6 Tab  0     ?  sertraline (ZOLOFT) 100 mg tablet  Take 100 mg by mouth daily. Indications: ANXIETY WITH DEPRESSION               ?  prenatal multivit-ca-min-fe-fa (PRENATAL VITAMIN) tab  Take 1 Tab by mouth  daily.  30 Tab  0             Past History        Past Medical History:     Past Medical History:        Diagnosis  Date         ?  Asthma             Past Surgical History:   History reviewed. No pertinent surgical history.      Family History:     Family History         Problem  Relation  Age of Onset          ?  No Known Problems  Mother            ?  No Known Problems  Father             Social History:     Social History          Tobacco Use         ?  Smoking status:  Former Smoker              Packs/day:  0.25         ?  Smokeless tobacco:  Never Used        ?  Tobacco comment: 1-2 cigarettes a day       Substance Use Topics         ?  Alcohol use:  No         ?  Drug use:  No           Allergies:   No Known Allergies           Review of Systems        Review of Systems    Constitutional: Negative for chills, diaphoresis, fatigue and fever.    HENT: Negative.     Eyes: Negative for photophobia, pain and visual disturbance (blurred vision, diplopia, vision loss).    Respiratory: Negative for shortness of breath.     Cardiovascular: Negative for chest pain.    Gastrointestinal: Positive for nausea. Negative for abdominal pain, diarrhea and vomiting.    Genitourinary: Negative for frequency and urgency.    Musculoskeletal: Positive for myalgias. Negative for back pain, gait problem, neck pain and neck stiffness.    Skin: Negative for rash.     Neurological: Positive for headaches. Negative for dizziness, syncope, facial asymmetry, speech difficulty, weakness  (generalized and one-sided), light-headedness and numbness.         Denies head trauma, denies LOC    Psychiatric/Behavioral: Negative.     All other systems reviewed and are negative.           Physical Exam        Physical Exam   Vitals signs and nursing note reviewed.   Constitutional:        General: She is awake. She is not in acute distress.     Appearance: Normal appearance. She is well-developed. She is not ill-appearing, toxic-appearing or diaphoretic.   HENT:       Head: Normocephalic and atraumatic.      Nose: Nose normal.      Mouth/Throat:      Mouth: Mucous membranes are moist.      Pharynx: No oropharyngeal exudate or posterior oropharyngeal erythema.   Eyes:       General: Gaze aligned appropriately. No scleral icterus.     Extraocular  Movements: Extraocular movements intact.      Conjunctiva/sclera: Conjunctivae normal.      Pupils: Pupils are equal, round, and reactive to light.   Neck:       Musculoskeletal: Normal range of motion and neck supple. No muscular tenderness.      Vascular: No carotid bruit.    Cardiovascular:       Rate and Rhythm: Normal rate and regular rhythm.      Pulses:            Radial pulses are 2+ on the right side and 2+  on the left side.         Posterior tibial pulses are 2+ on the right side and 2+  on the left side.      Heart sounds: No murmur. No friction rub. No gallop.     Pulmonary:       Effort: Pulmonary effort is normal. No respiratory distress.      Breath sounds: Normal breath sounds. No wheezing, rhonchi or rales.   Chest:       Chest wall: No tenderness.   Abdominal :      General: Abdomen is flat. Bowel sounds are normal. There is no distension.      Palpations: Abdomen is soft.      Tenderness: There is no abdominal tenderness. There is no right CVA tenderness, left CVA tenderness, guarding or rebound.     Musculoskeletal: Normal range of  motion.          General: No swelling or tenderness.      Right lower leg: No edema.      Left lower leg: No edema.    Skin:      General: Skin is warm and dry.      Capillary Refill: Capillary refill takes less than 2 seconds.      Coloration: Skin is not jaundiced or pale.      Findings: No erythema or rash.   Neurological:       General: No focal deficit present.      Mental Status: She is alert and oriented to person, place, and time. Mental status is at baseline.      GCS: GCS eye subscore is 4 . GCS verbal subscore is 5. GCS motor subscore is 6 .      Cranial Nerves: Cranial nerves are intact. No cranial nerve deficit, dysarthria or facial asymmetry.      Sensory: Sensation is intact. No sensory deficit.      Motor: Motor function is intact. No weakness, tremor or pronator drift.       Coordination: Finger-Nose-Finger Test and Heel to [employer] normal.      Gait: Gait normal.    Psychiatric:         Mood and Affect: Mood normal.         Behavior: Behavior normal. Behavior is cooperative.         Thought Content: Thought content normal.         Judgment:  Judgment normal.               Lab and Diagnostic Study Results        Labs -         Recent Results (from the past 12 hour(s))     CBC WITH AUTOMATED DIFF          Collection Time: 07/12/19  7:00 PM         Result  Value  Ref Range            WBC  4.7  3.6 - 11.0 K/uL       RBC  4.59  3.80 - 5.20 M/uL       HGB  12.5  11.5 - 16.0 g/dL       HCT  76.7  20.9 - 47.0 %       MCV  79.5 (L)  80.0 - 99.0 FL       MCH  27.2  26.0 - 34.0 PG       MCHC  34.2  30.0 - 36.5 g/dL       RDW  47.0  96.2 - 14.5 %       PLATELET  353  150 - 400 K/uL       MPV  9.1  8.9 - 12.9 FL       NRBC  0.0  0 PER 100 WBC       ABSOLUTE NRBC  0.00  0.00 - 0.01 K/uL       NEUTROPHILS  36  32 - 75 %       LYMPHOCYTES  50 (H)  12 - 49 %       MONOCYTES  11  5 - 13 %       EOSINOPHILS  2  0 - 7 %       BASOPHILS  1  0 - 1 %       IMMATURE GRANULOCYTES  0  0.0 - 0.5 %       ABS. NEUTROPHILS   1.7 (L)  1.8 - 8.0 K/UL       ABS. LYMPHOCYTES  2.4  0.8 - 3.5 K/UL       ABS. MONOCYTES  0.5  0.0 - 1.0 K/UL       ABS. EOSINOPHILS  0.1  0.0 - 0.4 K/UL       ABS. BASOPHILS  0.0  0.0 - 0.1 K/UL       ABS. IMM. GRANS.  0.0  0.00 - 0.04 K/UL       DF  AUTOMATED          METABOLIC PANEL, BASIC          Collection Time: 07/12/19  7:00 PM         Result  Value  Ref Range            Sodium  138  136 - 145 mmol/L       Potassium  3.8  3.5 - 5.1 mmol/L       Chloride  107  97 - 108 mmol/L       CO2  26  21 - 32 mmol/L       Anion gap  5  5 - 15 mmol/L       Glucose  91  65 - 100 mg/dL       BUN  13  6 - 20 mg/dL       Creatinine  8.36  0.55 - 1.02 mg/dL       BUN/Creatinine ratio  15  12 - 20         GFR est AA  >60  >60 ml/min/1.62m2       GFR est non-AA  >60  >60 ml/min/1.73m2       Calcium  8.4 (L)  8.5 - 10.1 mg/dL       HCG QL SERUM  Collection Time: 07/12/19  7:00 PM         Result  Value  Ref Range            HCG, Ql.  Negative  Negative             Radiologic Studies -         CT Results   (Last 48 hours)                                    07/12/19 1928    CT HEAD WO CONT  Final result            Impression:    1.  There is a mild degree of calcification within the basal ganglia. This is      likely incidental. Please see above discussion.      2.  The remainder of the CT evaluation of the head is normal.                                            Narrative:    The study is a noncontrasted CT examination of the head dated 07/12/2019.             HISTORY: Headaches.             TECHNIQUE: Thin section axial imaging was performed followed by sagittal and      coronal reconstructed imaging.             Dose Reduction Technique was employed to reduce radiation exposure - This      includes reduction optimization techniques as appropriate to a performed exam      with automated exposure control adjustments of the mA and/or Kv according to      patient size, or use of iterative reconstruction technique.              COMPARISON: None.             The ventricles are normal in size with a midline position. This examination is      negative for intracranial hemorrhage, mass effect or an extra axial collection.      The gray-white matter junctions are preserved without cytotoxic or vasogenic      edema.              An assessment of the white matter tracts demonstrates normal density      characteristics.             ORBITS: Normal.              PARANASAL SINUSES: Normally pneumatized without acute sinus disease.              The mastoid air cells and middle ear cavities image in a normal fashion. The      sella and the suprasellar regions are unremarkable. The craniocervical junction      images in a normal fashion.             OTHER: There is a midline configuration to the ventricular system without mass      effect. The basilar cisterns image in a normal fashion. There is a mild increase      density within the basal ganglia consistent with mild basal ganglia  calcification. This can occur with aging and also be associated with certain      hypercalcemic states including hypoparathyroidism, pseudohypoparathyroidism, and      hyperparathyroidism.                                       Medical Decision Making     - I am the first provider for this patient.      - I reviewed the vital signs, available nursing notes, past medical history, past surgical history, family history and social history.      - Initial assessment performed. The patients presenting problems have been discussed, and they are in agreement with the care plan formulated and outlined with them.  I have encouraged them to ask questions as they arise throughout their visit.      Vital Signs-Reviewed the patient's vital signs.   Patient Vitals for the past 12 hrs:            Temp  Pulse  Resp  BP  SpO2            07/12/19 1844  97.9 ??F (36.6 ??C)  77  19  108/79  96 %           Records Reviewed: Nursing Notes and Old Medical Records      The patient presents with  headache with a differential diagnosis of viral syndrome, intracranial abnormality, dehydration         ED Course:              Provider Notes (Medical Decision Making):       MDM   Number of Diagnoses or Management Options   Encounter for screening laboratory testing for COVID-19 virus   Nonintractable headache, unspecified  chronicity pattern, unspecified headache type   Diagnosis management comments:       39 year old female with headache.  CT head negative for acute process.  Labs and UA unremarkable.  Sent PCR Covid test given potential exposure.  Patient felt better after treatment in the ED.  She is neurovascularly intact.  Low suspicion for severe  infectious etiology at this time.  Discharged home with Fioricet for symptomatic relief and recommend increased hydration and rest.  Return precautions discussed          Amount and/or Complexity of Data Reviewed   Clinical lab tests: ordered and reviewed   Tests in the radiology section of CPT??: ordered and reviewed   Review and summarize past medical records: yes      Patient Progress   Patient progress: stable                     Disposition     Disposition: DC- Adult Discharges: All of the diagnostic tests were reviewed and questions answered. Diagnosis, care  plan and treatment options were discussed.  The patient understands the instructions and will follow up as directed. The patients results have been reviewed with them.  They have been counseled regarding their diagnosis.  The patient verbally convey understanding  and agreement of the signs, symptoms, diagnosis, treatment and prognosis and additionally agrees to follow up as recommended with their PCP in 24 - 48 hours.  They also agree with the care-plan and convey that all of their questions have been answered.   I have also put together some discharge instructions for them that include: 1) educational information regarding  their diagnosis, 2) how to care for their diagnosis at home, as well a 3) list  of reasons why they would want to return to the ED prior to  their follow-up appointment, should their condition change.      Discharged      DISCHARGE PLAN:   1.      Current Discharge Medication List              CONTINUE these medications which have NOT CHANGED          Details        methocarbamol (ROBAXIN) 750 mg tablet  Take 1 Tab by mouth four (4) times daily.   Qty: 20 Tab, Refills: 0               lidocaine (LIDODERM) 5 %  Apply patch to the affected area for 12 hours a day and remove for 12 hours a day.   Qty: 10 Patch, Refills: 0               ondansetron (ZOFRAN ODT) 8 mg disintegrating tablet  Take 1 Tab by mouth every eight (8) hours as needed for Nausea.   Qty: 6 Tab, Refills: 0               sertraline (ZOLOFT) 100 mg tablet  Take 100 mg by mouth daily. Indications: ANXIETY WITH DEPRESSION               prenatal multivit-ca-min-fe-fa (PRENATAL VITAMIN) tab  Take 1 Tab by mouth daily.   Qty: 30 Tab, Refills: 0                      2.      Follow-up Information               Follow up With  Specialties  Details  Why  Contact Info              Primary Care Provider    In 2 days  As needed                [facility] EMERGENCY DEPT  Emergency Medicine    As needed, If symptoms worsen  [address] [place] [address]   [phone]             3.  Return to ED if worse    4.      Current Discharge Medication List              START taking these medications          Details        butalbital-acetaminophen-caff (FIORICET) 50-300-40 mg per capsule  Take 1 Cap by mouth every six (6) hours as needed for Headache for up to 3 days.   Qty: 12 Cap, Refills:  0               ondansetron (Zofran ODT) 4 mg disintegrating tablet  Take 1 Tab by mouth every eight (8) hours as needed for Nausea or Vomiting for up to 3 days.   Qty: 12 Tab, Refills:  0                              Diagnosis        Clinical Impression:       1.  Nonintractable headache, unspecified chronicity pattern, unspecified headache type  2.  Encounter for screening laboratory testing for COVID-19 virus            Attestations:      Molli Barrows, PA      Please note that this dictation was completed with Dragon, the computer voice recognition software.  Quite often unanticipated grammatical, syntax, homophones, and other interpretive errors are inadvertently  transcribed by the computer software.  Please disregard these errors.  Please excuse any errors that have escaped final proofreading.  Thank you.

## 2019-07-12 NOTE — ED Notes (Signed)
Headache at approx 1400 today, pain unrelieved by motrin, vomiting at work

## 2019-07-12 NOTE — ED Provider Notes (Signed)
EMERGENCY DEPARTMENT HISTORY AND PHYSICAL EXAM      Date: 07/12/2019  Patient Name: Connie Morgan    History of Presenting Illness     Chief Complaint   Patient presents with   ??? Headache       History Provided By: Patient    HPI: Felisha Claytor, 39 y.o. female with a past medical history significant for asthma who presents to the ED with cc of gradual onset, gradually worsening, constant headache located to frontal head which started at 2 PM today.  No particular exacerbating symptoms.  Slightly alleviated with Motrin.  Associated symptoms include nausea, 5 episodes of emesis today, and myalgias.  Patient works at [employer] and is possible COVID-19 exposure.  Patient states this is the worst headache she has ever had. Patient denies fever, chills, chest pain, shortness of breath, diarrhea, dizziness, lightheadedness, photophobia, visual changes (diplopia, blurred), numbness, tingling, one-sided weakness, speech difficulty, gait problems, neck pain, neck stiffness, eye pain      There are no other complaints, changes, or physical findings at this time.    PCP: Other, Phys, MD    No current facility-administered medications on file prior to encounter.      Current Outpatient Medications on File Prior to Encounter   Medication Sig Dispense Refill   ??? methocarbamol (ROBAXIN) 750 mg tablet Take 1 Tab by mouth four (4) times daily. 20 Tab 0   ??? lidocaine (LIDODERM) 5 % Apply patch to the affected area for 12 hours a day and remove for 12 hours a day. 10 Patch 0   ??? [DISCONTINUED] ondansetron (ZOFRAN ODT) 8 mg disintegrating tablet Take 1 Tab by mouth every eight (8) hours as needed for Nausea. 6 Tab 0   ??? sertraline (ZOLOFT) 100 mg tablet Take 100 mg by mouth daily. Indications: ANXIETY WITH DEPRESSION     ??? prenatal multivit-ca-min-fe-fa (PRENATAL VITAMIN) tab Take 1 Tab by mouth daily. 30 Tab 0       Past History     Past Medical History:  Past Medical History:   Diagnosis Date   ??? Asthma        Past Surgical History:   History reviewed. No pertinent surgical history.    Family History:  Family History   Problem Relation Age of Onset   ??? No Known Problems Mother    ??? No Known Problems Father        Social History:  Social History     Tobacco Use   ??? Smoking status: Former Smoker     Packs/day: 0.25   ??? Smokeless tobacco: Never Used   ??? Tobacco comment: 1-2 cigarettes a day   Substance Use Topics   ??? Alcohol use: No   ??? Drug use: No       Allergies:  No Known Allergies      Review of Systems     Review of Systems   Constitutional: Negative for chills, diaphoresis, fatigue and fever.   HENT: Negative.    Eyes: Negative for photophobia, pain and visual disturbance (blurred vision, diplopia, vision loss).   Respiratory: Negative for shortness of breath.    Cardiovascular: Negative for chest pain.   Gastrointestinal: Positive for nausea. Negative for abdominal pain, diarrhea and vomiting.   Genitourinary: Negative for frequency and urgency.   Musculoskeletal: Positive for myalgias. Negative for back pain, gait problem, neck pain and neck stiffness.   Skin: Negative for rash.   Neurological: Positive for headaches. Negative for dizziness, syncope, facial  asymmetry, speech difficulty, weakness (generalized and one-sided), light-headedness and numbness.        Denies head trauma, denies LOC   Psychiatric/Behavioral: Negative.    All other systems reviewed and are negative.      Physical Exam     Physical Exam  Vitals signs and nursing note reviewed.   Constitutional:       General: She is awake. She is not in acute distress.     Appearance: Normal appearance. She is well-developed. She is not ill-appearing, toxic-appearing or diaphoretic.   HENT:      Head: Normocephalic and atraumatic.      Nose: Nose normal.      Mouth/Throat:      Mouth: Mucous membranes are moist.      Pharynx: No oropharyngeal exudate or posterior oropharyngeal erythema.   Eyes:      General: Gaze aligned appropriately. No scleral icterus.      Extraocular Movements: Extraocular movements intact.      Conjunctiva/sclera: Conjunctivae normal.      Pupils: Pupils are equal, round, and reactive to light.   Neck:      Musculoskeletal: Normal range of motion and neck supple. No muscular tenderness.      Vascular: No carotid bruit.   Cardiovascular:      Rate and Rhythm: Normal rate and regular rhythm.      Pulses:           Radial pulses are 2+ on the right side and 2+ on the left side.        Posterior tibial pulses are 2+ on the right side and 2+ on the left side.      Heart sounds: No murmur. No friction rub. No gallop.    Pulmonary:      Effort: Pulmonary effort is normal. No respiratory distress.      Breath sounds: Normal breath sounds. No wheezing, rhonchi or rales.   Chest:      Chest wall: No tenderness.   Abdominal:      General: Abdomen is flat. Bowel sounds are normal. There is no distension.      Palpations: Abdomen is soft.      Tenderness: There is no abdominal tenderness. There is no right CVA tenderness, left CVA tenderness, guarding or rebound.   Musculoskeletal: Normal range of motion.         General: No swelling or tenderness.      Right lower leg: No edema.      Left lower leg: No edema.   Skin:     General: Skin is warm and dry.      Capillary Refill: Capillary refill takes less than 2 seconds.      Coloration: Skin is not jaundiced or pale.      Findings: No erythema or rash.   Neurological:      General: No focal deficit present.      Mental Status: She is alert and oriented to person, place, and time. Mental status is at baseline.      GCS: GCS eye subscore is 4. GCS verbal subscore is 5. GCS motor subscore is 6.      Cranial Nerves: Cranial nerves are intact. No cranial nerve deficit, dysarthria or facial asymmetry.      Sensory: Sensation is intact. No sensory deficit.      Motor: Motor function is intact. No weakness, tremor or pronator drift.      Coordination: Finger-Nose-Finger Test and Heel to [employer] normal.  Gait: Gait normal.   Psychiatric:         Mood and Affect: Mood normal.         Behavior: Behavior normal. Behavior is cooperative.         Thought Content: Thought content normal.         Judgment: Judgment normal.         Lab and Diagnostic Study Results     Labs -     Recent Results (from the past 12 hour(s))   CBC WITH AUTOMATED DIFF    Collection Time: 07/12/19  7:00 PM   Result Value Ref Range    WBC 4.7 3.6 - 11.0 K/uL    RBC 4.59 3.80 - 5.20 M/uL    HGB 12.5 11.5 - 16.0 g/dL    HCT 03.0 09.2 - 33.0 %    MCV 79.5 (L) 80.0 - 99.0 FL    MCH 27.2 26.0 - 34.0 PG    MCHC 34.2 30.0 - 36.5 g/dL    RDW 07.6 22.6 - 33.3 %    PLATELET 353 150 - 400 K/uL    MPV 9.1 8.9 - 12.9 FL    NRBC 0.0 0 PER 100 WBC    ABSOLUTE NRBC 0.00 0.00 - 0.01 K/uL    NEUTROPHILS 36 32 - 75 %    LYMPHOCYTES 50 (H) 12 - 49 %    MONOCYTES 11 5 - 13 %    EOSINOPHILS 2 0 - 7 %    BASOPHILS 1 0 - 1 %    IMMATURE GRANULOCYTES 0 0.0 - 0.5 %    ABS. NEUTROPHILS 1.7 (L) 1.8 - 8.0 K/UL    ABS. LYMPHOCYTES 2.4 0.8 - 3.5 K/UL    ABS. MONOCYTES 0.5 0.0 - 1.0 K/UL    ABS. EOSINOPHILS 0.1 0.0 - 0.4 K/UL    ABS. BASOPHILS 0.0 0.0 - 0.1 K/UL    ABS. IMM. GRANS. 0.0 0.00 - 0.04 K/UL    DF AUTOMATED     METABOLIC PANEL, BASIC    Collection Time: 07/12/19  7:00 PM   Result Value Ref Range    Sodium 138 136 - 145 mmol/L    Potassium 3.8 3.5 - 5.1 mmol/L    Chloride 107 97 - 108 mmol/L    CO2 26 21 - 32 mmol/L    Anion gap 5 5 - 15 mmol/L    Glucose 91 65 - 100 mg/dL    BUN 13 6 - 20 mg/dL    Creatinine 5.45 6.25 - 1.02 mg/dL    BUN/Creatinine ratio 15 12 - 20      GFR est AA >60 >60 ml/min/1.48m2    GFR est non-AA >60 >60 ml/min/1.69m2    Calcium 8.4 (L) 8.5 - 10.1 mg/dL   HCG QL SERUM    Collection Time: 07/12/19  7:00 PM   Result Value Ref Range    HCG, Ql. Negative Negative         Radiologic Studies -     CT Results  (Last 48 hours)               07/12/19 1928  CT HEAD WO CONT Final result     Impression:  1.  There is a mild degree of calcification within the basal ganglia. This is   likely incidental. Please see above discussion.   2.  The remainder of the CT evaluation of the head is normal.  Narrative:  The study is a noncontrasted CT examination of the head dated 07/12/2019.       HISTORY: Headaches.       TECHNIQUE: Thin section axial imaging was performed followed by sagittal and   coronal reconstructed imaging.       Dose Reduction Technique was employed to reduce radiation exposure - This   includes reduction optimization techniques as appropriate to a performed exam   with automated exposure control adjustments of the mA and/or Kv according to   patient size, or use of iterative reconstruction technique.       COMPARISON: None.       The ventricles are normal in size with a midline position. This examination is   negative for intracranial hemorrhage, mass effect or an extra axial collection.   The gray-white matter junctions are preserved without cytotoxic or vasogenic   edema.        An assessment of the white matter tracts demonstrates normal density   characteristics.       ORBITS: Normal.        PARANASAL SINUSES: Normally pneumatized without acute sinus disease.        The mastoid air cells and middle ear cavities image in a normal fashion. The   sella and the suprasellar regions are unremarkable. The craniocervical junction   images in a normal fashion.       OTHER: There is a midline configuration to the ventricular system without mass   effect. The basilar cisterns image in a normal fashion. There is a mild increase   density within the basal ganglia consistent with mild basal ganglia   calcification. This can occur with aging and also be associated with certain   hypercalcemic states including hypoparathyroidism, pseudohypoparathyroidism, and   hyperparathyroidism.                   Medical Decision Making   - I am the first provider for this patient.     - I reviewed the vital signs, available nursing notes, past medical history, past surgical history, family history and social history.    - Initial assessment performed. The patients presenting problems have been discussed, and they are in agreement with the care plan formulated and outlined with them.  I have encouraged them to ask questions as they arise throughout their visit.    Vital Signs-Reviewed the patient's vital signs.  Patient Vitals for the past 12 hrs:   Temp Pulse Resp BP SpO2   07/12/19 1844 97.9 ??F (36.6 ??C) 77 19 108/79 96 %       Records Reviewed: Nursing Notes and Old Medical Records    The patient presents with headache with a differential diagnosis of viral syndrome, intracranial abnormality, dehydration      ED Course:          Provider Notes (Medical Decision Making):     MDM  Number of Diagnoses or Management Options  Encounter for screening laboratory testing for COVID-19 virus  Nonintractable headache, unspecified chronicity pattern, unspecified headache type  Diagnosis management comments:     39 year old female with headache.  CT head negative for acute process.  Labs and UA unremarkable.  Sent PCR Covid test given potential exposure.  Patient felt better after treatment in the ED.  She is neurovascularly intact.  Low suspicion for severe infectious etiology at this time.  Discharged home with Fioricet for symptomatic relief and recommend increased hydration and rest.  Return precautions discussed  Amount and/or Complexity of Data Reviewed  Clinical lab tests: ordered and reviewed  Tests in the radiology section of CPT??: ordered and reviewed  Review and summarize past medical records: yes    Patient Progress  Patient progress: stable             Disposition    Disposition: DC- Adult Discharges: All of the diagnostic tests were reviewed and questions answered. Diagnosis, care plan and treatment options were discussed.  The patient understands the instructions and will follow up as directed. The patients results have been reviewed with them.  They have been counseled regarding their diagnosis.  The patient verbally convey understanding and agreement of the signs, symptoms, diagnosis, treatment and prognosis and additionally agrees to follow up as recommended with their PCP in 24 - 48 hours.  They also agree with the care-plan and convey that all of their questions have been answered.  I have also put together some discharge instructions for them that include: 1) educational information regarding their diagnosis, 2) how to care for their diagnosis at home, as well a 3) list of reasons why they would want to return to the ED prior to their follow-up appointment, should their condition change.    Discharged    DISCHARGE PLAN:  1.   Current Discharge Medication List      CONTINUE these medications which have NOT CHANGED    Details   methocarbamol (ROBAXIN) 750 mg tablet Take 1 Tab by mouth four (4) times daily.  Qty: 20 Tab, Refills: 0      lidocaine (LIDODERM) 5 % Apply patch to the affected area for 12 hours a day and remove for 12 hours a day.  Qty: 10 Patch, Refills: 0      ondansetron (ZOFRAN ODT) 8 mg disintegrating tablet Take 1 Tab by mouth every eight (8) hours as needed for Nausea.  Qty: 6 Tab, Refills: 0      sertraline (ZOLOFT) 100 mg tablet Take 100 mg by mouth daily. Indications: ANXIETY WITH DEPRESSION      prenatal multivit-ca-min-fe-fa (PRENATAL VITAMIN) tab Take 1 Tab by mouth daily.  Qty: 30 Tab, Refills: 0           2.   Follow-up Information     Follow up With Specialties Details Why Contact Info    Primary Care Provider  In 2 days As needed     [facility] EMERGENCY DEPT Emergency Medicine  As needed, If symptoms worsen [address] [facility] [place] [address]  [phone]        3.  Return to ED if worse   4.   Current Discharge Medication List      START taking these medications    Details   butalbital-acetaminophen-caff (FIORICET) 50-300-40 mg per capsule Take 1 Cap by mouth every six (6) hours as needed for Headache for up to 3 days.  Qty: 12 Cap, Refills: 0      ondansetron (Zofran ODT) 4 mg disintegrating tablet Take 1 Tab by mouth every eight (8) hours as needed for Nausea or Vomiting for up to 3 days.  Qty: 12 Tab, Refills: 0               Diagnosis     Clinical Impression:   1. Nonintractable headache, unspecified chronicity pattern, unspecified headache type    2. Encounter for screening laboratory testing for COVID-19 virus        Attestations:    Molli Barrows, [place]  Please note that this dictation was completed with Dragon, the computer voice recognition software.  Quite often unanticipated grammatical, syntax, homophones, and other interpretive errors are inadvertently transcribed by the computer software.  Please disregard these errors.  Please excuse any errors that have escaped final proofreading.  Thank you.

## 2019-07-12 NOTE — ED Triage Notes (Signed)
Headache at approx 1400 today, pain unrelieved by motrin, vomiting at work

## 2019-07-13 LAB — METABOLIC PANEL, BASIC
Anion gap: 5 mmol/L (ref 5–15)
BUN/Creatinine ratio: 15 (ref 12–20)
BUN: 13 mg/dL (ref 6–20)
CO2: 26 mmol/L (ref 21–32)
Calcium: 8.4 mg/dL — ABNORMAL LOW (ref 8.5–10.1)
Chloride: 107 mmol/L (ref 97–108)
Creatinine: 0.86 mg/dL (ref 0.55–1.02)
GFR est AA: >60 mL/min/{1.73_m2} (ref >60)
GFR est non-AA: >60 mL/min/{1.73_m2} (ref >60)
Glucose: 91 mg/dL (ref 65–100)
Potassium: 3.8 mmol/L (ref 3.5–5.1)
Sodium: 138 mmol/L (ref 136–145)

## 2019-07-13 LAB — CBC WITH AUTOMATED DIFF
ABS. BASOPHILS: 0 10*3/uL (ref 0.0–0.1)
ABS. EOSINOPHILS: 0.1 10*3/uL (ref 0.0–0.4)
ABS. IMM. GRANS.: 0 10*3/uL (ref 0.00–0.04)
ABS. LYMPHOCYTES: 2.4 10*3/uL (ref 0.8–3.5)
ABS. MONOCYTES: 0.5 10*3/uL (ref 0.0–1.0)
ABS. NEUTROPHILS: 1.7 10*3/uL — ABNORMAL LOW (ref 1.8–8.0)
ABSOLUTE NRBC: 0 10*3/uL (ref 0.00–0.01)
BASOPHILS: 1 % (ref 0–1)
EOSINOPHILS: 2 % (ref 0–7)
HCT: 36.5 % (ref 35.0–47.0)
HGB: 12.5 g/dL (ref 11.5–16.0)
IMMATURE GRANULOCYTES: 0 % (ref 0.0–0.5)
LYMPHOCYTES: 50 % — ABNORMAL HIGH (ref 12–49)
MCH: 27.2 PG (ref 26.0–34.0)
MCHC: 34.2 g/dL (ref 30.0–36.5)
MCV: 79.5 FL — ABNORMAL LOW (ref 80.0–99.0)
MONOCYTES: 11 % (ref 5–13)
MPV: 9.1 FL (ref 8.9–12.9)
NEUTROPHILS: 36 % (ref 32–75)
NRBC: 0 PER 100 WBC
PLATELET: 353 10*3/uL (ref 150–400)
RBC: 4.59 M/uL (ref 3.80–5.20)
RDW: 14.5 % (ref 11.5–14.5)
WBC: 4.7 10*3/uL (ref 3.6–11.0)

## 2019-07-13 LAB — HCG QL SERUM
HCG(Serum) Pregnancy Test: NEGATIVE
HCG, Ql.: NEGATIVE

## 2019-07-13 LAB — SARS-COV-2

## 2019-07-13 LAB — CBC WITH AUTO DIFFERENTIAL
Basophils %: 1 % (ref 0–1)
Basophils Absolute: 0 10*3/uL (ref 0.0–0.1)
Eosinophils %: 2 % (ref 0–7)
Eosinophils Absolute: 0.1 10*3/uL (ref 0.0–0.4)
Granulocyte Absolute Count: 0 10*3/uL (ref 0.00–0.04)
Hematocrit: 36.5 % (ref 35.0–47.0)
Hemoglobin: 12.5 g/dL (ref 11.5–16.0)
Immature Granulocytes: 0 % (ref 0.0–0.5)
Lymphocytes %: 50 % — ABNORMAL HIGH (ref 12–49)
Lymphocytes Absolute: 2.4 10*3/uL (ref 0.8–3.5)
MCH: 27.2 PG (ref 26.0–34.0)
MCHC: 34.2 g/dL (ref 30.0–36.5)
MCV: 79.5 FL — ABNORMAL LOW (ref 80.0–99.0)
MPV: 9.1 FL (ref 8.9–12.9)
Monocytes %: 11 % (ref 5–13)
Monocytes Absolute: 0.5 10*3/uL (ref 0.0–1.0)
NRBC Absolute: 0 10*3/uL (ref 0.00–0.01)
Neutrophils %: 36 % (ref 32–75)
Neutrophils Absolute: 1.7 10*3/uL — ABNORMAL LOW (ref 1.8–8.0)
Nucleated RBCs: 0 PER 100 WBC
Platelets: 353 10*3/uL (ref 150–400)
RBC: 4.59 M/uL (ref 3.80–5.20)
RDW: 14.5 % (ref 11.5–14.5)
WBC: 4.7 10*3/uL (ref 3.6–11.0)

## 2019-07-13 LAB — BASIC METABOLIC PANEL
Anion Gap: 5 mmol/L (ref 5–15)
BUN: 13 mg/dL (ref 6–20)
Bun/Cre Ratio: 15 (ref 12–20)
CO2: 26 mmol/L (ref 21–32)
Calcium: 8.4 mg/dL — ABNORMAL LOW (ref 8.5–10.1)
Chloride: 107 mmol/L (ref 97–108)
Creatinine: 0.86 mg/dL (ref 0.55–1.02)
EGFR IF NonAfrican American: >60 mL/min/{1.73_m2} (ref >60)
GFR African American: >60 mL/min/{1.73_m2} (ref >60)
Glucose: 91 mg/dL (ref 65–100)
Potassium: 3.8 mmol/L (ref 3.5–5.1)
Sodium: 138 mmol/L (ref 136–145)

## 2019-07-13 LAB — COVID-19

## 2019-07-13 MED ORDER — BUTALBITAL-ACETAMINOPHEN-CAFFEINE 50 MG-325 MG-40 MG TAB
50-325-40 mg | ORAL | Status: AC
Start: 2019-07-13 — End: 2019-07-12
  Administered 2019-07-13: 02:00:00 via ORAL

## 2019-07-13 MED ORDER — BUTALBITAL-ACETAMINOPHEN-CAFFEINE 50 MG-300 MG-40 MG CAPSULE
50-300-40 mg | ORAL_CAPSULE | Freq: Four times a day (QID) | ORAL | 0 refills | Status: AC | PRN
Start: 2019-07-13 — End: 2019-07-15

## 2019-07-13 MED ORDER — ONDANSETRON 4 MG TAB, RAPID DISSOLVE
4 mg | ORAL_TABLET | Freq: Three times a day (TID) | ORAL | 0 refills | Status: AC | PRN
Start: 2019-07-13 — End: 2019-07-15

## 2019-07-13 MED ORDER — PROCHLORPERAZINE EDISYLATE 5 MG/ML INJECTION
5 mg/mL | INTRAMUSCULAR | Status: AC
Start: 2019-07-13 — End: 2019-07-12
  Administered 2019-07-13: 01:00:00 via INTRAVENOUS

## 2019-07-13 MED ORDER — DIPHENHYDRAMINE HCL 50 MG/ML IJ SOLN
50 mg/mL | Freq: Once | INTRAMUSCULAR | Status: AC
Start: 2019-07-13 — End: 2019-07-12
  Administered 2019-07-13: 01:00:00 via INTRAVENOUS

## 2019-07-13 MED FILL — PROCHLORPERAZINE EDISYLATE 5 MG/ML INJECTION: 5 mg/mL | INTRAMUSCULAR | Qty: 2

## 2019-07-13 MED FILL — BUTALBITAL-ACETAMINOPHEN-CAFFEINE 50 MG-325 MG-40 MG TAB: 50-325-40 mg | ORAL | Qty: 1

## 2019-07-13 MED FILL — DIPHENHYDRAMINE HCL 50 MG/ML IJ SOLN: 50 mg/mL | INTRAMUSCULAR | Qty: 1

## 2019-07-14 LAB — NOVEL CORONAVIRUS (COVID-19): SARS-CoV-2: NOT DETECTED

## 2019-07-14 LAB — COVID-19: SARS-CoV-2: NOT DETECTED

## 2021-12-12 ENCOUNTER — Inpatient Hospital Stay
Admit: 2021-12-12 | Discharge: 2021-12-12 | Disposition: A | Discharge status: Home / Self Care | Payer: MEDICARE | Attending: Emergency Medicine

## 2021-12-12 ENCOUNTER — Emergency Department: Admit: 2021-12-12 | Payer: MEDICARE

## 2021-12-12 DIAGNOSIS — O99611 Diseases of the digestive system complicating pregnancy, first trimester: Secondary | ICD-10-CM

## 2021-12-12 DIAGNOSIS — Z3490 Encounter for supervision of normal pregnancy, unspecified, unspecified trimester: Secondary | ICD-10-CM

## 2021-12-12 LAB — URINALYSIS WITH REFLEX TO CULTURE
BACTERIA, URINE: NEGATIVE /hpf
Bilirubin Urine: NEGATIVE
Blood, Urine: NEGATIVE
Glucose, UA: NEGATIVE mg/dL
Ketones, Urine: NEGATIVE mg/dL
Leukocyte Esterase, Urine: NEGATIVE
Nitrite, Urine: NEGATIVE
Protein, UA: NEGATIVE mg/dL
Specific Gravity, UA: 1.02 (ref 1.003–1.030)
Urobilinogen, Urine: 2 EU/dL — ABNORMAL HIGH (ref 0.1–1.0)
pH, Urine: 7 (ref 5.0–8.0)

## 2021-12-12 LAB — TRICHOMONAS VAGINALI RAPID ANTIGEN: Trichomonas Rapid Ag: NEGATIVE

## 2021-12-12 LAB — COMPREHENSIVE METABOLIC PANEL
ALT: 16 U/L (ref 12–78)
AST: 24 U/L (ref 15–37)
Albumin/Globulin Ratio: 0.8 — ABNORMAL LOW (ref 1.1–2.2)
Albumin: 3.4 g/dL — ABNORMAL LOW (ref 3.5–5.0)
Alk Phosphatase: 64 U/L (ref 45–117)
Anion Gap: 7 mmol/L (ref 5–15)
BUN: 16 mg/dL (ref 6–20)
Bun/Cre Ratio: 19 (ref 12–20)
CO2: 23 mmol/L (ref 21–32)
Calcium: 8.5 mg/dL (ref 8.5–10.1)
Chloride: 109 mmol/L — ABNORMAL HIGH (ref 97–108)
Creatinine: 0.84 mg/dL (ref 0.55–1.02)
Est, Glom Filt Rate: >60 mL/min/{1.73_m2} (ref >60)
Globulin: 4.2 g/dL — ABNORMAL HIGH (ref 2.0–4.0)
Glucose: 98 mg/dL (ref 65–100)
Potassium: 4 mmol/L (ref 3.5–5.1)
Sodium: 139 mmol/L (ref 136–145)
Total Bilirubin: 0.4 mg/dL (ref 0.2–1.0)
Total Protein: 7.6 g/dL (ref 6.4–8.2)

## 2021-12-12 LAB — CBC WITH AUTO DIFFERENTIAL
Absolute Immature Granulocyte: 0 10*3/uL (ref 0.00–0.04)
Basophils %: 1 % (ref 0–1)
Basophils Absolute: 0 10*3/uL (ref 0.0–0.1)
Eosinophils %: 2 % (ref 0–7)
Eosinophils Absolute: 0.1 10*3/uL (ref 0.0–0.4)
Hematocrit: 29.4 % — ABNORMAL LOW (ref 35.0–47.0)
Hemoglobin: 10.1 g/dL — ABNORMAL LOW (ref 11.5–16.0)
Immature Granulocytes: 0 % (ref 0–0.5)
Lymphocytes %: 34 % (ref 12–49)
Lymphocytes Absolute: 2.2 10*3/uL (ref 0.8–3.5)
MCH: 23.9 PG — ABNORMAL LOW (ref 26.0–34.0)
MCHC: 34.4 g/dL (ref 30.0–36.5)
MCV: 69.7 FL — ABNORMAL LOW (ref 80.0–99.0)
MPV: 8.7 FL — ABNORMAL LOW (ref 8.9–12.9)
Monocytes %: 9 % (ref 5–13)
Monocytes Absolute: 0.6 10*3/uL (ref 0.0–1.0)
Neutrophils %: 54 % (ref 32–75)
Neutrophils Absolute: 3.4 10*3/uL (ref 1.8–8.0)
Nucleated RBCs: 0 PER 100 WBC
Platelets: 458 10*3/uL — ABNORMAL HIGH (ref 150–400)
RBC: 4.22 M/uL (ref 3.80–5.20)
RDW: 18.1 % — ABNORMAL HIGH (ref 11.5–14.5)
WBC: 6.3 10*3/uL (ref 3.6–11.0)
nRBC: 0 10*3/uL (ref 0.00–0.01)

## 2021-12-12 LAB — HCG, QUANTITATIVE, PREGNANCY: hCG Quant: 220 m[IU]/mL — ABNORMAL HIGH (ref 0–6)

## 2021-12-12 LAB — KOH (VAGINAL, RESPIRATORY): KOH Prep: NONE SEEN

## 2021-12-12 MED ORDER — DOCUSATE SODIUM 100 MG PO CAPS
100 MG | ORAL_CAPSULE | Freq: Two times a day (BID) | ORAL | 0 refills | Status: AC
Start: 2021-12-12 — End: ?

## 2021-12-12 NOTE — ED Provider Notes (Signed)
[facility]  EMERGENCY DEPARTMENT HISTORY AND PHYSICAL EXAM      Date: 12/12/2021  Patient Name: Connie Morgan  MRN: [id number]  [place] Aug 20, 1980  Date of evaluation: 12/12/2021  Provider: Dwyane Luo, MD   Note Started: 2:29 PM EDT 12/12/21    HISTORY OF PRESENT ILLNESS     Chief Complaint   Patient presents with    Abdominal Pain     Pregnant [redacted] weeks       History Provided By: Patient    HPI: Connie Morgan is a 41 y.o. female G13 P6 A6 at [redacted] weeks gestation comes to the ED with abdominal pain.  Described as suprapubic cramps, nonradiating, nothing specific that makes it better or worse without vaginal bleeding, discharge, dysuria hematuria.  No fever or chills.  She was seen at an outside facility in which she was informed she was pregnant.  Has not had follow-up yet.  Patient tried Tylenol with improvement of her pain.  She also reported that she has constipation and now she has hemorrhoids.  Whenever she defecates she is having discomfort.    PAST MEDICAL HISTORY   Past Medical History:  Past Medical History:   Diagnosis Date    Asthma        Past Surgical History:  History reviewed. No pertinent surgical history.    Family History:  Family History   Problem Relation Age of Onset    No Known Problems Mother     No Known Problems Father        Social History:  Social History     Tobacco Use    Smoking status: Former     Packs/day: 0.25     Types: Cigarettes    Smokeless tobacco: Never    Tobacco comments:     Quit smoking: 1-2 cigarettes a day   Substance Use Topics    Alcohol use: No    Drug use: No       Allergies:  No Known Allergies    PCP: None None    Current Meds:   No current facility-administered medications for this encounter.     Current Outpatient Medications   Medication Sig Dispense Refill    docusate sodium (COLACE) 100 MG capsule Take 1 capsule by mouth 2 times daily 30 capsule 0       Social Determinants of Health:   Social Determinants of Health     Tobacco Use: Medium Risk    Smoking  Tobacco Use: Former    Smokeless Tobacco Use: Never    Passive Exposure: Not on file   Alcohol Use: Not At Risk    Frequency of Alcohol Consumption: Never    Average Number of Drinks: Patient does not drink    Frequency of Binge Drinking: Never   Merchant navy officer: Not on Training and development officer Insecurity: Not on file   Transportation Needs: Not on file   Physical Activity: Not on file   Stress: Not on file   Social Connections: Not on file   Intimate Partner Violence: Not on file   Depression: Not on file   Housing Stability: Not on file   Postpartum Depression: Not on file       PHYSICAL EXAM   Physical Exam  Vitals and nursing note reviewed. Exam conducted with a chaperone present.   Constitutional:       General: She is not in acute distress.     Appearance: She is well-developed. She is not  ill-appearing, toxic-appearing or diaphoretic.   HENT:      Head: Normocephalic and atraumatic.   Cardiovascular:      Rate and Rhythm: Normal rate and regular rhythm.   Pulmonary:      Effort: Pulmonary effort is normal.      Breath sounds: Normal breath sounds.   Abdominal:      General: There is no distension or abdominal bruit.      Palpations: Abdomen is soft.      Tenderness: There is abdominal tenderness in the suprapubic area.   Genitourinary:     Rectum: External hemorrhoid present.   Skin:     General: Skin is warm and dry.   Neurological:      General: No focal deficit present.      Mental Status: She is alert and oriented to person, place, and time.       SCREENINGS               LAB, EKG AND DIAGNOSTIC RESULTS   Labs:  Recent Results (from the past 12 hour(s))   CBC with Auto Differential    Collection Time: 12/12/21  2:28 PM   Result Value Ref Range    WBC 6.3 3.6 - 11.0 K/uL    RBC 4.22 3.80 - 5.20 M/uL    Hemoglobin 10.1 (L) 11.5 - 16.0 g/dL    Hematocrit 29.4 (L) 35.0 - 47.0 %    MCV 69.7 (L) 80.0 - 99.0 FL    MCH 23.9 (L) 26.0 - 34.0 PG    MCHC 34.4 30.0 - 36.5 g/dL    RDW 18.1 (H) 11.5 - 14.5 %    Platelets  458 (H) 150 - 400 K/uL    MPV 8.7 (L) 8.9 - 12.9 FL    Nucleated RBCs 0.0 0.0 PER 100 WBC    nRBC 0.00 0.00 - 0.01 K/uL    Neutrophils % 54 32 - 75 %    Lymphocytes % 34 12 - 49 %    Monocytes % 9 5 - 13 %    Eosinophils % 2 0 - 7 %    Basophils % 1 0 - 1 %    Immature Granulocytes 0 0 - 0.5 %    Neutrophils Absolute 3.4 1.8 - 8.0 K/UL    Lymphocytes Absolute 2.2 0.8 - 3.5 K/UL    Monocytes Absolute 0.6 0.0 - 1.0 K/UL    Eosinophils Absolute 0.1 0.0 - 0.4 K/UL    Basophils Absolute 0.0 0.0 - 0.1 K/UL    Absolute Immature Granulocyte 0.0 0.00 - 0.04 K/UL    Differential Type AUTOMATED     Comprehensive Metabolic Panel    Collection Time: 12/12/21  2:28 PM   Result Value Ref Range    Sodium 139 136 - 145 mmol/L    Potassium 4.0 3.5 - 5.1 mmol/L    Chloride 109 (H) 97 - 108 mmol/L    CO2 23 21 - 32 mmol/L    Anion Gap 7 5 - 15 mmol/L    Glucose 98 65 - 100 mg/dL    BUN 16 6 - 20 mg/dL    Creatinine 0.84 0.55 - 1.02 mg/dL    Bun/Cre Ratio 19 12 - 20      Est, Glom Filt Rate >60 >60 ml/min/1.31m    Calcium 8.5 8.5 - 10.1 mg/dL    Total Bilirubin 0.4 0.2 - 1.0 mg/dL    AST 24 15 - 37 U/L    ALT 16  12 - 78 U/L    Alk Phosphatase 64 45 - 117 U/L    Total Protein 7.6 6.4 - 8.2 g/dL    Albumin 3.4 (L) 3.5 - 5.0 g/dL    Globulin 4.2 (H) 2.0 - 4.0 g/dL    Albumin/Globulin Ratio 0.8 (L) 1.1 - 2.2     HCG, Quantitative, Pregnancy    Collection Time: 12/12/21  2:28 PM   Result Value Ref Range    hCG Quant 220 (H) 0 - 6 mIU/mL   Urinalysis with Reflex to Culture    Collection Time: 12/12/21  2:28 PM    Specimen: Urine   Result Value Ref Range    Color, UA Yellow/Straw      Appearance Turbid (A) Clear      Specific Gravity, UA 1.020 1.003 - 1.030      pH, Urine 7.0 5.0 - 8.0      Protein, UA Negative Negative mg/dL    Glucose, UA Negative Negative mg/dL    Ketones, Urine Negative Negative mg/dL    Bilirubin Urine Negative Negative      Blood, Urine Negative Negative      Urobilinogen, Urine 2.0 (H) 0.1 - 1.0 EU/dL    Nitrite, Urine  Negative Negative      Leukocyte Esterase, Urine Negative Negative      BACTERIA, URINE Negative Negative /hpf    Urine Culture if Indicated Culture not indicated by UA result Culture not indicated by UA result      WBC, UA 0-4 0 - 4 /hpf    RBC, UA 0-5 0 - 5 /hpf    Epithelial Cells UA Many (A) Few /lpf    Mucus, UA Trace /lpf   KOH (VAGINAL, RESPIRATORY)    Collection Time: 12/12/21  3:34 PM    Specimen: Vaginal   Result Value Ref Range    Special Requests No Special Requests      KOH Prep No yeast seen     Trichomonas Vaginali Rapid Antigen    Collection Time: 12/12/21  3:35 PM    Specimen: Cervix   Result Value Ref Range    Trichomonas Rapid Ag Negative Negative         Radiologic Studies:  Non-plain film images such as CT, Ultrasound and MRI are read by the radiologist. Plain radiographic images are visualized and preliminarily interpreted by the ED Provider with the following findings: See ED Course Below    Interpretation per the Radiologist below, if available at the time of this note:  US OB LESS THAN 14 WEEKS SINGLE OR FIRST GESTATION   Final Result      1. No intrauterine pregnancy is identified at this time.      2. A small amount of free fluid is seen in the pelvis.      3. Normal sonographic appearance of the ovaries.      US OB TRANSVAGINAL   Final Result      1. No intrauterine pregnancy is identified at this time.      2. A small amount of free fluid is seen in the pelvis.      3. Normal sonographic appearance of the ovaries.           EMERGENCY DEPARTMENT COURSE and DIFFERENTIAL DIAGNOSIS/MDM   CC/HPI Summary, DDx, ED Course, and Reassessment:     Records Reviewed (source and summary of external notes): Prior medical records and Nursing notes    Vitals:    Vitals:  12/12/21 1409 12/12/21 1825   BP: (!) 121/59 132/85   Pulse: 93 95   Resp: 18 16   Temp: 97.8 F (36.6 C) 98.3 F (36.8 C)   TempSrc: Oral Oral   SpO2: 99% 97%   Weight: 71.2 kg (157 lb)    Height: 1.753 m (5' 9")      Patient is  41 year old female with PMH of asthma who is G13 P6 A6 at [redacted] weeks gestation comes to the ED with abdominal pain.  Pain could be secondary to pregnancy related pain.  Also could be secondary to a urinary tract infection or STD.  She does not have any vaginal bleeding or vaginal discharge.  She has mild reducible tenderness in the suprapubic area but otherwise her abdominal examination does not reveal any signs of peritoneal irritation.  We will get labs including CBC, chemistry, urinalysis, STD screening, and we will get pelvic ultrasound.     ED COURSE  ED Course as of 12/12/21 1900   Sun Dec 12, 2021   1610 I independently reviewed interpreted patient labs.  CBC is unremarkable.  Chemistry within normal.  hCG is 220.  In the setting of her description of being [redacted] weeks pregnant this could be a failed pregnancy versus early pregnancy.  Pending urinalysis and STD screening for disposition. [AA]   1652 US OB LESS THAN 14 WEEKS SINGLE OR FIRST GESTATION  IMPRESSION:     1. No intrauterine pregnancy is identified at this time.     2. A small amount of free fluid is seen in the pelvis.     3. Normal sonographic appearance of the ovaries. [AA]      ED Course User Index  [AA] Alzora Ha I Moreen Fowler, MD       I independently reviewed and interpreted the patient work-up.  CBC is unremarkable.  Chemistry within normal.  Urinalysis without evidence of urine tract infection.  Trichomoniasis and KOH prep are negative.  Patient is not concerned for STD and would want a wait for results of chlamydia and gonorrhea.  Her quantitative beta-hCG is 220.  When she was at the other facility from 2 days ago, patient reports was 50.  Thus, pregnancy is progressing.  She is instructed to come back in 2 days for quantitative beta-hCG check and will follow-up with her obstetrician.  She will get over-the-counter hemorrhoid treatment and I did refer her to general surgery for further evaluation of her hemorrhoids for possible definitive treatment.   From her pelvic discomfort point of view is more likely to be related to pregnancy.  There is no acute finding noted on her work-up and ultrasound.  Thus, instructed take Tylenol as needed and follow-up with her obstetrician.    After completion of workup and discussion of results and diagnoses with the patient, all the patient's questions were answered. If required, all follow up appointments and treatments were discussed and explained. The patient sounded understanding and agrees with the plan.    The patient understands that at this time there is no evidence for a more malignant underlying process, but the patient also understands that early in the process of an illness, an emergency department workup can be falsely reassuring. Routine discharge counseling was given to the patient and the patient understands that worsening, changing or persistent symptoms should prompt an immediate call or follow up with their primary physician or the emergency department. The importance of appropriate follow up was also discussed with the patient. More extensive  discharge instructions were given in the patient's discharge paperwork.      ED FINAL IMPRESSION     1. Pregnancy, unspecified gestational age    53. External hemorrhoid          DISPOSITION/PLAN   DISPOSITION Decision To Discharge 12/12/2021 06:17:38 PM    Discharge Note: The patient is stable for discharge home. The signs, symptoms, diagnosis, and discharge instructions have been discussed, understanding conveyed, and agreed upon. The patient is to follow up as recommended or return to ER should their symptoms worsen.      PATIENT REFERRED TO:  [facility] EMERGENCY DEPT  [place]  [phone]  Go to   As needed, If symptoms worsen    OB/GYN    Schedule an appointment as soon as possible for a visit in 2 days  For reevaluation, For follow-up    [place], MD  [place]  [id number]  [phone]              DISCHARGE MEDICATIONS:     Medication List        START taking these medications      docusate sodium 100 MG capsule  Commonly known as: Colace  Take 1 capsule by mouth 2 times daily               Where to Get Your Medications        These medications were sent to [id number] [place], [place] [address] [id number]     Phone: [phone]  docusate sodium 100 MG capsule           DISCONTINUED MEDICATIONS:  Discharge Medication List as of 12/12/2021  6:22 PM          I am the Primary Clinician of Record. Meghanne Pletz IClayborn Heron MD (electronically signed)    (Please note that parts of this dictation were completed with voice recognition software. Quite often unanticipated grammatical, syntax, homophones, and other interpretive errors are inadvertently transcribed by the computer software. Please disregards these errors. Please excuse any errors that have escaped final proofreading.)     ADwyane Luo MD  12/12/21 1900

## 2021-12-12 NOTE — ED Triage Notes (Signed)
Patient arrives to ED co lower abdominal pain x 2 weeks. She is about [redacted] weeks pregnant and [facility] for this pain and that is where she learned she was pregnant. However, nothing else was done for her pain. Patient appears uncomfortable. LBM 30 mins ago was soft but not liquid. Ao4.

## 2021-12-14 LAB — C.TRACHOMATIS N.GONORRHOEAE DNA
C. Trachomatis Amplified: NEGATIVE
N. Gonorrhoeae Amplified: NEGATIVE

## 2021-12-20 ENCOUNTER — Inpatient Hospital Stay
Admit: 2021-12-20 | Discharge: 2021-12-20 | Disposition: A | Discharge status: Home / Self Care | Payer: MEDICARE | Attending: Emergency Medicine

## 2021-12-20 ENCOUNTER — Emergency Department: Admit: 2021-12-20 | Payer: MEDICARE

## 2021-12-20 DIAGNOSIS — O2311 Infections of bladder in pregnancy, first trimester: Secondary | ICD-10-CM

## 2021-12-20 DIAGNOSIS — N3000 Acute cystitis without hematuria: Secondary | ICD-10-CM

## 2021-12-20 LAB — URINALYSIS WITH MICROSCOPIC
BACTERIA, URINE: NEGATIVE /hpf
Bilirubin Urine: NEGATIVE
Blood, Urine: NEGATIVE
Glucose, UA: NEGATIVE mg/dL
Ketones, Urine: NEGATIVE mg/dL
Nitrite, Urine: NEGATIVE
Protein, UA: NEGATIVE mg/dL
Specific Gravity, UA: 1.019 (ref 1.003–1.030)
Urobilinogen, Urine: 0.1 EU/dL (ref 0.1–1.0)
pH, Urine: 5 (ref 5.0–8.0)

## 2021-12-20 LAB — TRICHOMONAS VAGINALI RAPID ANTIGEN: Trichomonas Rapid Ag: POSITIVE — AB

## 2021-12-20 LAB — KOH (VAGINAL, RESPIRATORY): KOH Prep: NONE SEEN

## 2021-12-20 LAB — HCG, QUANTITATIVE, PREGNANCY: hCG Quant: 3997 m[IU]/mL — ABNORMAL HIGH (ref 0–6)

## 2021-12-20 MED ORDER — METRONIDAZOLE 500 MG PO TABS
500 MG | ORAL_TABLET | Freq: Two times a day (BID) | ORAL | 0 refills | Status: AC
Start: 2021-12-20 — End: 2021-12-27

## 2021-12-20 MED ORDER — PRENATAL VITAMIN 27-0.8 MG PO TABS
ORAL_TABLET | Freq: Every day | ORAL | 0 refills | Status: AC
Start: 2021-12-20 — End: ?

## 2021-12-20 MED ORDER — ONDANSETRON 4 MG PO TBDP
4 MG | ORAL_TABLET | Freq: Three times a day (TID) | ORAL | 0 refills | Status: DC | PRN
Start: 2021-12-20 — End: 2023-01-18

## 2021-12-20 MED ORDER — CEPHALEXIN 500 MG PO CAPS
500 MG | ORAL_CAPSULE | Freq: Four times a day (QID) | ORAL | 0 refills | Status: AC
Start: 2021-12-20 — End: 2021-12-27

## 2021-12-20 NOTE — ED Triage Notes (Signed)
6 weeks preg. Lower abd pain. Vaginal irritation and itching. Denies urinary sx

## 2021-12-20 NOTE — ED Provider Notes (Signed)
[facility]  EMERGENCY DEPARTMENT HISTORY AND PHYSICAL EXAM      Date: 12/20/2021  Patient Name: Connie Morgan  MRN: [id number]  Birthdate: Oct 31, 1980  Date of evaluation: 12/20/2021  Provider: Cecil Cranker, MD   Note Started: 8:15 AM EDT 12/20/21    HISTORY OF PRESENT ILLNESS     Chief Complaint   Patient presents with    Abdominal Pain       History Provided By: Patient    HPI: Connie Morgan is a 41 y.o. female presents to the emergency department complaint of vaginal irritation and itching along with suprapubic abdominal cramping.  Patient denies dysuria, denies fevers or chills.  Patient reports she is proximally [redacted] weeks pregnant.  Patient states she was seen for similar complaints about 1 week ago with negative work-up.    PAST MEDICAL HISTORY   Past Medical History:  Past Medical History:   Diagnosis Date    Asthma        Past Surgical History:  History reviewed. No pertinent surgical history.    Family History:  Family History   Problem Relation Age of Onset    No Known Problems Mother     No Known Problems Father        Social History:  Social History     Tobacco Use    Smoking status: Former     Packs/day: 0.25     Types: Cigarettes    Smokeless tobacco: Never    Tobacco comments:     Quit smoking: 1-2 cigarettes a day   Vaping Use    Vaping Use: Never used   Substance Use Topics    Alcohol use: No    Drug use: No       Allergies:  No Known Allergies    PCP: None None    Current Meds:   No current facility-administered medications for this encounter.     Current Outpatient Medications   Medication Sig Dispense Refill    cephALEXin (KEFLEX) 500 MG capsule Take 1 capsule by mouth 4 times daily for 7 days 28 capsule 0    ondansetron (ZOFRAN-ODT) 4 MG disintegrating tablet Place 1 tablet under the tongue every 8 hours as needed for Nausea or Vomiting 20 tablet 0    Prenatal Vit-Fe Fumarate-FA (PRENATAL VITAMIN) 27-0.8 MG TABS Take 1 tablet by mouth daily 30 tablet 0    metroNIDAZOLE (FLAGYL) 500 MG  tablet Take 1 tablet by mouth 2 times daily for 7 days 14 tablet 0    docusate sodium (COLACE) 100 MG capsule Take 1 capsule by mouth 2 times daily 30 capsule 0       Social Determinants of Health:   Social Determinants of Health     Tobacco Use: Medium Risk    Smoking Tobacco Use: Former    Smokeless Tobacco Use: Never    Passive Exposure: Not on file   Alcohol Use: Not At Risk    Frequency of Alcohol Consumption: Never    Average Number of Drinks: Patient does not drink    Frequency of Binge Drinking: Never   Programmer, applications: Not on Pensions consultant Insecurity: Not on file   Transportation Needs: Not on file   Physical Activity: Not on file   Stress: Not on file   Social Connections: Not on file   Intimate Partner Violence: Not on file   Depression: Not on file   Housing Stability: Not on file   Postpartum Depression: Not on  file       PHYSICAL EXAM   Physical Exam  Physical Exam  Constitutional:       General: No acute distress.     Appearance: Normal appearance.  Not toxic-appearing.   HENT:      Head: Normocephalic and atraumatic.      Nose: Nose normal.      Mouth/Throat:      Mouth: Mucous membranes are moist.   Eyes:      Extraocular Movements: Extraocular movements intact.      Pupils: Pupils are equal, round, and reactive to light.   Cardiovascular:      Rate and Rhythm: Normal rate.      Pulses: Normal pulses.   Pulmonary:      Effort: Pulmonary effort is normal.      Breath sounds: No stridor.   Abdominal:      General: Abdomen is flat. There is no distension.   Musculoskeletal:         General: Normal range of motion.      Cervical back: Normal range of motion and neck supple.   Skin:     General: Skin is warm and dry.      Capillary Refill: Capillary refill takes less than 2 seconds.   Neurological:      General: No focal deficit present.      Mental Status: Aert and oriented to person, place, and time.   Psychiatric:         Mood and Affect: Mood normal.         Behavior: Behavior normal.        SCREENINGS              LAB, EKG AND DIAGNOSTIC RESULTS   Labs:  Recent Results (from the past 12 hour(s))   Urinalysis with Microscopic    Collection Time: 12/20/21  7:43 AM   Result Value Ref Range    Color, UA Yellow/Straw      Appearance Turbid (A) Clear      Specific Gravity, UA 1.019 1.003 - 1.030      pH, Urine 5.0 5.0 - 8.0      Protein, UA Negative Negative mg/dL    Glucose, UA Negative Negative mg/dL    Ketones, Urine Negative Negative mg/dL    Bilirubin Urine Negative Negative      Blood, Urine Negative Negative      Urobilinogen, Urine 0.1 0.1 - 1.0 EU/dL    Nitrite, Urine Negative Negative      Leukocyte Esterase, Urine Large (A) Negative      WBC, UA 0-4 0 - 4 /hpf    RBC, UA 0-5 0 - 5 /hpf    Epithelial Cells UA Many (A) Few /lpf    BACTERIA, URINE Negative Negative /hpf   HCG, Quantitative, Pregnancy    Collection Time: 12/20/21  8:10 AM   Result Value Ref Range    hCG Quant 3,997 (H) 0 - 6 mIU/mL   KOH (VAGINAL, RESPIRATORY)    Collection Time: 12/20/21 10:44 AM    Specimen: Vaginal swab   Result Value Ref Range    Special Requests No Special Requests      KOH Prep No yeast seen     Trichomonas Vaginali Rapid Antigen    Collection Time: 12/20/21 10:44 AM    Specimen: Vaginal swab   Result Value Ref Range    Trichomonas Rapid Ag Positive (A) Negative         EKG: Initial  EKG interpreted by me.Not Applicable    Radiologic Studies:  Non-plain film images such as CT, Ultrasound and MRI are read by the radiologist. Plain radiographic images are visualized and preliminarily interpreted by the ED Physician with the following findings: Not Applicable.    Interpretation per the Radiologist below, if available at the time of this note:  US OB LESS THAN 14 WEEKS SINGLE OR FIRST GESTATION   Final Result   Single intrauterine gestational sac with an AUA of 5 weeks, 3 days.   No fetal pole or yolk sac identified at this time. Suspected subchorionic   hemorrhage. Nonspecific small volume pelvic free fluid.       US OB TRANSVAGINAL   Final Result   Single intrauterine gestational sac with an AUA of 5 weeks, 3 days.   No fetal pole or yolk sac identified at this time. Suspected subchorionic   hemorrhage. Nonspecific small volume pelvic free fluid.           ED COURSE and DIFFERENTIAL DIAGNOSIS/MDM   CC/HPI Summary, DDx, ED Course, and Reassessment: Patient with low hCG quant 1 week ago without vaginal bleeding but with mild cramping pain.  Will recheck hCG level at this time as the ultrasound from 1 week ago was negative for noted IUP or any other abnormality.  We will get screening urine and test for KOH for yeast infection.    Records Reviewed (source and summary of external notes): Prior medical records and Nursing notes    Vitals:    Vitals:    12/20/21 0653 12/20/21 0746 12/20/21 0756   BP: 112/72 107/73    Pulse: 87     Resp: 18     Temp: 99 F (37.2 C)     TempSrc: Oral     SpO2: 98% 100% 100%   Weight: 71.2 kg (157 lb)     Height: 1.753 m (5\' 9" )          ED COURSE       Disposition Considerations (Tests not done, Shared Decision Making, Pt Expectation of Test or Treatment.): Not Applicable    Patient was given the following medications:  Medications - No data to display    CONSULTS: (Who and What was discussed)  None     Social Determinants affecting Dx or Tx: None    Smoking Cessation: Not Applicable    PROCEDURES   Unless otherwise noted above, none.  Procedures      CRITICAL CARE TIME   Patient does not meet Critical Care Time, 0 minutes    FINAL IMPRESSION     1. Acute cystitis without hematuria    2. Trichomoniasis    3. Less than [redacted] weeks gestation of pregnancy          DISPOSITION/PLAN   DISPOSITION Decision To Discharge 12/20/2021 12:42:29 PM    Discharge Note: The patient is stable for discharge home. The signs, symptoms, diagnosis, and discharge instructions have been discussed, understanding conveyed, and agreed upon. The patient is to follow up as recommended or return to ER should their symptoms  worsen.      PATIENT REFERRED TO:  12/22/2021, MD  [address]  [address]  [place]  [phone]    Schedule an appointment as soon as possible for a visit           DISCHARGE MEDICATIONS:     Medication List        START taking these medications  cephALEXin 500 MG capsule  Commonly known as: KEFLEX  Take 1 capsule by mouth 4 times daily for 7 days     metroNIDAZOLE 500 MG tablet  Commonly known as: FLAGYL  Take 1 tablet by mouth 2 times daily for 7 days     ondansetron 4 MG disintegrating tablet  Commonly known as: ZOFRAN-ODT  Place 1 tablet under the tongue every 8 hours as needed for Nausea or Vomiting     Prenatal Vitamin 27-0.8 MG Tabs  Take 1 tablet by mouth daily            ASK your doctor about these medications      docusate sodium 100 MG capsule  Commonly known as: Colace  Take 1 capsule by mouth 2 times daily               Where to Get Your Medications        These medications [facility] to [id number] [place], [place] - [address] - P [phone] - F [phone]  [address] [place] [address]      Phone: [phone]   cephALEXin 500 MG capsule  metroNIDAZOLE 500 MG tablet  ondansetron 4 MG disintegrating tablet  Prenatal Vitamin 27-0.8 MG Tabs           DISCONTINUED MEDICATIONS:  Current Discharge Medication List          I am the Primary Clinician of Record: Cecil Cranker, MD (electronically signed)    (Please note that parts of this dictation were completed with voice recognition software. Quite often unanticipated grammatical, syntax, homophones, and other interpretive errors are inadvertently transcribed by the computer software. Please disregards these errors. Please excuse any errors that have escaped final proofreading.)     Cecil Cranker, MD  12/20/21 1247

## 2021-12-20 NOTE — Discharge Instructions (Signed)
Thank you!  Thank you for allowing me to care for you in the emergency department. It is my goal to provide you with excellent care. If you have not received excellent quality care, please ask to speak to the nurse manager. Please fill out the survey that will come to you by mail or email since we listen to your feedback!     Below you will find a list of your tests from today's visit.  Should you have any questions, please do not hesitate to call the emergency department.    Labs  Recent Results (from the past 12 hour(s))   Urinalysis with Microscopic    Collection Time: 12/20/21  7:43 AM   Result Value Ref Range    Color, UA Yellow/Straw      Appearance Turbid (A) Clear      Specific Gravity, UA 1.019 1.003 - 1.030      pH, Urine 5.0 5.0 - 8.0      Protein, UA Negative Negative mg/dL    Glucose, UA Negative Negative mg/dL    Ketones, Urine Negative Negative mg/dL    Bilirubin Urine Negative Negative      Blood, Urine Negative Negative      Urobilinogen, Urine 0.1 0.1 - 1.0 EU/dL    Nitrite, Urine Negative Negative      Leukocyte Esterase, Urine Large (A) Negative      WBC, UA 0-4 0 - 4 /hpf    RBC, UA 0-5 0 - 5 /hpf    Epithelial Cells UA Many (A) Few /lpf    BACTERIA, URINE Negative Negative /hpf   HCG, Quantitative, Pregnancy    Collection Time: 12/20/21  8:10 AM   Result Value Ref Range    hCG Quant 3,997 (H) 0 - 6 mIU/mL   KOH (VAGINAL, RESPIRATORY)    Collection Time: 12/20/21 10:44 AM    Specimen: Vaginal swab   Result Value Ref Range    Special Requests No Special Requests      KOH Prep No yeast seen     Trichomonas Vaginali Rapid Antigen    Collection Time: 12/20/21 10:44 AM    Specimen: Vaginal swab   Result Value Ref Range    Trichomonas Rapid Ag Positive (A) Negative         Radiologic Studies  US OB LESS THAN 14 WEEKS SINGLE OR FIRST GESTATION   Final Result   Single intrauterine gestational sac with an AUA of 5 weeks, 3 days.   No fetal pole or yolk sac identified at this time. Suspected  subchorionic   hemorrhage. Nonspecific small volume pelvic free fluid.      US OB TRANSVAGINAL   Final Result   Single intrauterine gestational sac with an AUA of 5 weeks, 3 days.   No fetal pole or yolk sac identified at this time. Suspected subchorionic   hemorrhage. Nonspecific small volume pelvic free fluid.        ------------------------------------------------------------------------------------------------------------  The exam and treatment you received in the Emergency Department were for an urgent problem and are not intended as complete care. It is important that you follow-up with a doctor, nurse practitioner, or physician assistant to:  (1) confirm your diagnosis,  (2) re-evaluation of changes in your illness and treatment, and  (3) for ongoing care. Please take your discharge instructions with you when you go to your follow-up appointment.     If you have any problem arranging a follow-up appointment, contact the Emergency Department.  If your symptoms  become worse or you do not improve as expected and you are unable to reach your health care provider, please return to the Emergency Department. We are available 24 hours a day.     If a prescription has been provided, please have it filled as soon as possible to prevent a delay in treatment. If you have any questions or reservations about taking the medication due to side effects or interactions with other medications, please call your primary care provider or contact the ER.

## 2021-12-21 LAB — C.TRACHOMATIS N.GONORRHOEAE DNA
C. Trachomatis Amplified: NEGATIVE
N. Gonorrhoeae Amplified: NEGATIVE

## 2021-12-29 ENCOUNTER — Encounter: Payer: MEDICAID | Attending: Surgery

## 2022-01-08 ENCOUNTER — Emergency Department: Admit: 2022-01-08 | Payer: MEDICARE

## 2022-01-08 ENCOUNTER — Inpatient Hospital Stay
Admit: 2022-01-08 | Discharge: 2022-01-08 | Disposition: A | Discharge status: Home / Self Care | Payer: MEDICARE | Attending: Emergency Medicine

## 2022-01-08 DIAGNOSIS — O26891 Other specified pregnancy related conditions, first trimester: Secondary | ICD-10-CM

## 2022-01-08 LAB — URINALYSIS WITH MICROSCOPIC
Bilirubin Urine: NEGATIVE
Blood, Urine: NEGATIVE
Glucose, UA: NEGATIVE mg/dL
Ketones, Urine: NEGATIVE mg/dL
Leukocyte Esterase, Urine: NEGATIVE
Nitrite, Urine: NEGATIVE
Protein, UA: NEGATIVE mg/dL
Specific Gravity, UA: 1.015 (ref 1.003–1.030)
Urobilinogen, Urine: 0.1 EU/dL (ref 0.1–1.0)
pH, Urine: 7 (ref 5.0–8.0)

## 2022-01-08 LAB — CBC WITH AUTO DIFFERENTIAL
Absolute Immature Granulocyte: 0 10*3/uL (ref 0.00–0.04)
Basophils %: 1 % (ref 0–1)
Basophils Absolute: 0 10*3/uL (ref 0.0–0.1)
Eosinophils %: 1 % (ref 0–7)
Eosinophils Absolute: 0.1 10*3/uL (ref 0.0–0.4)
Hematocrit: 31.6 % — ABNORMAL LOW (ref 35.0–47.0)
Hemoglobin: 10.5 g/dL — ABNORMAL LOW (ref 11.5–16.0)
Immature Granulocytes: 0 % (ref 0–0.5)
Lymphocytes %: 29 % (ref 12–49)
Lymphocytes Absolute: 1.2 10*3/uL (ref 0.8–3.5)
MCH: 24.9 PG — ABNORMAL LOW (ref 26.0–34.0)
MCHC: 33.2 g/dL (ref 30.0–36.5)
MCV: 74.9 FL — ABNORMAL LOW (ref 80.0–99.0)
MPV: 8.8 FL — ABNORMAL LOW (ref 8.9–12.9)
Monocytes %: 9 % (ref 5–13)
Monocytes Absolute: 0.3 10*3/uL (ref 0.0–1.0)
Neutrophils %: 60 % (ref 32–75)
Neutrophils Absolute: 2.4 10*3/uL (ref 1.8–8.0)
Nucleated RBCs: 0 PER 100 WBC
Platelets: 323 10*3/uL (ref 150–400)
RBC: 4.22 M/uL (ref 3.80–5.20)
RDW: 18.9 % — ABNORMAL HIGH (ref 11.5–14.5)
WBC: 3.9 10*3/uL (ref 3.6–11.0)
nRBC: 0 10*3/uL (ref 0.00–0.01)

## 2022-01-08 LAB — TYPE AND SCREEN
ABO/Rh: O POS
Antibody Screen: NEGATIVE

## 2022-01-08 LAB — COMPREHENSIVE METABOLIC PANEL
ALT: 14 U/L (ref 12–78)
AST: 16 U/L (ref 15–37)
Albumin/Globulin Ratio: 0.8 — ABNORMAL LOW (ref 1.1–2.2)
Albumin: 2.9 g/dL — ABNORMAL LOW (ref 3.5–5.0)
Alk Phosphatase: 44 U/L — ABNORMAL LOW (ref 45–117)
Anion Gap: 5 mmol/L (ref 5–15)
BUN: 5 mg/dL — ABNORMAL LOW (ref 6–20)
Bun/Cre Ratio: 7 — ABNORMAL LOW (ref 12–20)
CO2: 23 mmol/L (ref 21–32)
Calcium: 8.4 mg/dL — ABNORMAL LOW (ref 8.5–10.1)
Chloride: 113 mmol/L — ABNORMAL HIGH (ref 97–108)
Creatinine: 0.67 mg/dL (ref 0.55–1.02)
Est, Glom Filt Rate: >60 mL/min/{1.73_m2} (ref >60)
Globulin: 3.6 g/dL (ref 2.0–4.0)
Glucose: 89 mg/dL (ref 65–100)
Potassium: 3.7 mmol/L (ref 3.5–5.1)
Sodium: 141 mmol/L (ref 136–145)
Total Bilirubin: 0.2 mg/dL (ref 0.2–1.0)
Total Protein: 6.5 g/dL (ref 6.4–8.2)

## 2022-01-08 LAB — LIPASE: Lipase: 61 U/L — ABNORMAL LOW (ref 73–393)

## 2022-01-08 LAB — HCG, QUANTITATIVE, PREGNANCY: hCG Quant: 53008 m[IU]/mL — ABNORMAL HIGH (ref 0–6)

## 2022-01-08 MED ORDER — CEPHALEXIN 500 MG PO CAPS
500 MG | ORAL_CAPSULE | Freq: Two times a day (BID) | ORAL | 0 refills | Status: DC
Start: 2022-01-08 — End: 2022-01-08

## 2022-01-08 MED ORDER — CEPHALEXIN 500 MG PO CAPS
500 MG | ORAL_CAPSULE | Freq: Two times a day (BID) | ORAL | 0 refills | Status: AC
Start: 2022-01-08 — End: 2022-01-15

## 2022-01-08 MED ORDER — ONDANSETRON 4 MG PO TBDP
4 MG | ORAL_TABLET | Freq: Three times a day (TID) | ORAL | 0 refills | Status: DC | PRN
Start: 2022-01-08 — End: 2023-01-18

## 2022-01-08 MED ORDER — ONDANSETRON 4 MG PO TBDP
4 MG | ORAL_TABLET | Freq: Three times a day (TID) | ORAL | 0 refills | Status: DC | PRN
Start: 2022-01-08 — End: 2022-01-08

## 2022-01-08 NOTE — ED Triage Notes (Addendum)
Eight weeks pregnant, last menstrual period 11/06/2021, started with light spotting last night, having severe lower abdominal pain this am. Has had prenatal care with ultrasound. [facility] is at

## 2022-01-08 NOTE — ED Provider Notes (Signed)
[facility] EMERGENCY DEPT  EMERGENCY DEPARTMENT HISTORY AND PHYSICAL EXAM      Date: 01/08/2022  Patient Name: Connie Morgan  MRN: [id number]  [place]: October 03, 1980  Date of evaluation: 01/08/2022  Provider: Amada Jupiter, MD   Note Started: 7:30 AM EDT 01/08/22    HISTORY OF PRESENT ILLNESS     Chief Complaint   Patient presents with    Pregnancy Problem       History Provided By: Patient    HPI: Connie Morgan is a 41 y.o. female presents emerged part with complaint of vaginal bleeding that started last night which she describes as spotting.  Patient reports associated lower abdominal pain x1 day.  Patient states she is approximately [redacted] weeks pregnant, has previously had an ultrasound to rule in intrauterine pregnancy.  Patient denies fevers or chills, denies nausea or vomiting.    PAST MEDICAL HISTORY   Past Medical History:  Past Medical History:   Diagnosis Date    Asthma        Past Surgical History:  History reviewed. No pertinent surgical history.    Family History:  Family History   Problem Relation Age of Onset    No Known Problems Mother     No Known Problems Father        Social History:  Social History     Tobacco Use    Smoking status: Former     Packs/day: 0.25     Types: Cigarettes    Smokeless tobacco: Never    Tobacco comments:     Quit smoking: 1-2 cigarettes a day   Vaping Use    Vaping Use: Never used   Substance Use Topics    Alcohol use: No    Drug use: No       Allergies:  No Known Allergies    PCP: None None    Current Meds:   No current facility-administered medications for this encounter.     Current Outpatient Medications   Medication Sig Dispense Refill    ondansetron (ZOFRAN-ODT) 4 MG disintegrating tablet Place 1 tablet under the tongue every 8 hours as needed for Nausea or Vomiting 20 tablet 0    cephALEXin (KEFLEX) 500 MG capsule Take 1 capsule by mouth 2 times daily for 7 days 14 capsule 0    ondansetron (ZOFRAN-ODT) 4 MG disintegrating tablet Place 1 tablet under the tongue every 8 hours as  needed for Nausea or Vomiting 20 tablet 0    Prenatal Vit-Fe Fumarate-FA (PRENATAL VITAMIN) 27-0.8 MG TABS Take 1 tablet by mouth daily 30 tablet 0    docusate sodium (COLACE) 100 MG capsule Take 1 capsule by mouth 2 times daily 30 capsule 0       Social Determinants of Health:   Social Determinants of Health     Tobacco Use: Medium Risk    Smoking Tobacco Use: Former    Smokeless Tobacco Use: Never    Passive Exposure: Not on file   Alcohol Use: Not At Risk    Frequency of Alcohol Consumption: Never    Average Number of Drinks: Patient does not drink    Frequency of Binge Drinking: Never   Merchant navy officer: Not on Training and development officer Insecurity: Not on file   Transportation Needs: Not on file   Physical Activity: Not on file   Stress: Not on file   Social Connections: Not on file   Intimate Partner Violence: Not on file   Depression: Not on file  Housing Stability: Not on file   Postpartum Depression: Not on file       PHYSICAL EXAM   Physical Exam  Physical Exam  Constitutional:       General: No acute distress.     Appearance: Normal appearance.  Not toxic-appearing.   HENT:      Head: Normocephalic and atraumatic.      Nose: Nose normal.      Mouth/Throat:      Mouth: Mucous membranes are moist.   Eyes:      Extraocular Movements: Extraocular movements intact.      Pupils: Pupils are equal, round, and reactive to light.   Cardiovascular:      Rate and Rhythm: Normal rate.      Pulses: Normal pulses.   Pulmonary:      Effort: Pulmonary effort is normal.      Breath sounds: No stridor.   Abdominal:      General: Abdomen is flat. There is no distension.  Suprapubic abdominal tenderness without guarding or rebound.  Musculoskeletal:         General: Normal range of motion.      Cervical back: Normal range of motion and neck supple.   Skin:     General: Skin is warm and dry.      Capillary Refill: Capillary refill takes less than 2 seconds.   Neurological:      General: No focal deficit present.      Mental Status:  Aert and oriented to person, place, and time.   Psychiatric:         Mood and Affect: Mood normal.         Behavior: Behavior normal.       SCREENINGS              LAB, EKG AND DIAGNOSTIC RESULTS   Labs:  Recent Results (from the past 12 hour(s))   CBC with Diff    Collection Time: 01/08/22  7:35 AM   Result Value Ref Range    WBC 3.9 3.6 - 11.0 K/uL    RBC 4.22 3.80 - 5.20 M/uL    Hemoglobin 10.5 (L) 11.5 - 16.0 g/dL    Hematocrit 31.6 (L) 35.0 - 47.0 %    MCV 74.9 (L) 80.0 - 99.0 FL    MCH 24.9 (L) 26.0 - 34.0 PG    MCHC 33.2 30.0 - 36.5 g/dL    RDW 18.9 (H) 11.5 - 14.5 %    Platelets 323 150 - 400 K/uL    MPV 8.8 (L) 8.9 - 12.9 FL    Nucleated RBCs 0.0 0.0 PER 100 WBC    nRBC 0.00 0.00 - 0.01 K/uL    Neutrophils % 60 32 - 75 %    Lymphocytes % 29 12 - 49 %    Monocytes % 9 5 - 13 %    Eosinophils % 1 0 - 7 %    Basophils % 1 0 - 1 %    Immature Granulocytes 0 0 - 0.5 %    Neutrophils Absolute 2.4 1.8 - 8.0 K/UL    Lymphocytes Absolute 1.2 0.8 - 3.5 K/UL    Monocytes Absolute 0.3 0.0 - 1.0 K/UL    Eosinophils Absolute 0.1 0.0 - 0.4 K/UL    Basophils Absolute 0.0 0.0 - 0.1 K/UL    Absolute Immature Granulocyte 0.0 0.00 - 0.04 K/UL    Differential Type AUTOMATED     CMP    Collection Time: 01/08/22  7:35 AM   Result Value Ref Range    Sodium 141 136 - 145 mmol/L    Potassium 3.7 3.5 - 5.1 mmol/L    Chloride 113 (H) 97 - 108 mmol/L    CO2 23 21 - 32 mmol/L    Anion Gap 5 5 - 15 mmol/L    Glucose 89 65 - 100 mg/dL    BUN 5 (L) 6 - 20 mg/dL    Creatinine 0.67 0.55 - 1.02 mg/dL    Bun/Cre Ratio 7 (L) 12 - 20      Est, Glom Filt Rate >60 >60 ml/min/1.39m    Calcium 8.4 (L) 8.5 - 10.1 mg/dL    Total Bilirubin 0.2 0.2 - 1.0 mg/dL    AST 16 15 - 37 U/L    ALT 14 12 - 78 U/L    Alk Phosphatase 44 (L) 45 - 117 U/L    Total Protein 6.5 6.4 - 8.2 g/dL    Albumin 2.9 (L) 3.5 - 5.0 g/dL    Globulin 3.6 2.0 - 4.0 g/dL    Albumin/Globulin Ratio 0.8 (L) 1.1 - 2.2     HCG, Quantitative, Pregnancy    Collection Time: 01/08/22  7:35  AM   Result Value Ref Range    hCG Quant 53,008 (H) 0 - 6 mIU/mL   Lipase    Collection Time: 01/08/22  7:35 AM   Result Value Ref Range    Lipase 61 (L) 73 - 393 U/L   TYPE AND SCREEN    Collection Time: 01/08/22  7:35 AM   Result Value Ref Range    Crossmatch expiration date 01/11/2022,2359     ABO/Rh O Positive     Antibody Screen Negative    Urinalysis with Microscopic    Collection Time: 01/08/22  9:38 AM   Result Value Ref Range    Color, UA Yellow/Straw      Appearance Clear Clear      Specific Gravity, UA 1.015 1.003 - 1.030      pH, Urine 7.0 5.0 - 8.0      Protein, UA Negative Negative mg/dL    Glucose, UA Negative Negative mg/dL    Ketones, Urine Negative Negative mg/dL    Bilirubin Urine Negative Negative      Blood, Urine Negative Negative      Urobilinogen, Urine 0.1 0.1 - 1.0 EU/dL    Nitrite, Urine Negative Negative      Leukocyte Esterase, Urine Negative Negative      WBC, UA 0-4 0 - 4 /hpf    RBC, UA 0-5 0 - 5 /hpf    Epithelial Cells UA Many (A) Few /lpf    BACTERIA, URINE 1+ (A) Negative /hpf    Mucus, UA Trace (A) Negative /lpf       EKG: Not Applicable    Radiologic Studies:  Non-plain film images such as CT, Ultrasound and MRI are read by the radiologist. Plain radiographic images are visualized and preliminarily interpreted by the ED Physician with the following findings: Not Applicable.    Interpretation per the Radiologist below, if available at the time of this note:  [place] LESS THAN [place]  Final Result   Single live 8 week 2 day intrauterine pregnancy.             [place] TRANSVAGINAL    (Results Pending)        ED COURSE and DIFFERENTIAL DIAGNOSIS/MDM   CC/HPI Summary, DDx,  ED Course, and Reassessment: Patient with lower abdominal pain and vaginal spotting since last night.  I saw patient recently that showed an intrauterine pregnancy with likely subchorionic hemorrhage which is likely to blame for the bleeding.  However, given the pain and the early findings  on the ultrasound from last time, will repeat the ultrasound today to evaluate the status of the pregnancy.  We will also get screening blood work to evaluate hemoglobin and blood type since patient is unaware of her blood type.  Also get screening urinalysis with microscopy given her pregnancy status.    Records Reviewed (source and summary of external notes): Prior medical records and Nursing notes    Vitals:    Vitals:    01/08/22 0709 01/08/22 0710   BP:  (!) 115/96   Pulse:  98   Resp:  20   Temp:  98.1 F (36.7 C)   TempSrc:  Oral   SpO2:  98%   Weight: 74.8 kg (165 lb)    Height: 1.753 m ('5\' 9"' )         ED COURSE       Disposition Considerations (Tests not done, Shared Decision Making, Pt Expectation of Test or Treatment.): See ED Course    Patient was given the following medications:  Medications - No data to display    CONSULTS: (Who and What was discussed)  None     Social Determinants affecting Dx or Tx: None    Smoking Cessation: Not Applicable    PROCEDURES   Unless otherwise noted above, none.  Procedures      CRITICAL CARE TIME   Patient does not meet Critical Care Time, 0 minutes    FINAL IMPRESSION     1. Abdominal pain in pregnancy, first trimester    2. Asymptomatic bacteriuria during pregnancy          DISPOSITION/PLAN   DISPOSITION Decision To Discharge 01/08/2022 12:38:25 PM    Discharge Note: The patient is stable for discharge home. The signs, symptoms, diagnosis, and discharge instructions have been discussed, understanding conveyed, and agreed upon. The patient is to follow up as recommended or return to ER should their symptoms worsen.      PATIENT REFERRED TO:  No follow-up provider specified.      DISCHARGE MEDICATIONS:     Medication List        START taking these medications      cephALEXin 500 MG capsule  Commonly known as: KEFLEX  Take 1 capsule by mouth 2 times daily for 7 days            CHANGE how you take these medications      * ondansetron 4 MG disintegrating tablet  Commonly  known as: ZOFRAN-ODT  Place 1 tablet under the tongue every 8 hours as needed for Nausea or Vomiting  What changed: Another medication with the same name was added. Make sure you understand how and when to take each.     * ondansetron 4 MG disintegrating tablet  Commonly known as: ZOFRAN-ODT  Place 1 tablet under the tongue every 8 hours as needed for Nausea or Vomiting  What changed: You were already taking a medication with the same name, and this prescription was added. Make sure you understand how and when to take each.           * This list has 2 medication(s) that are the same as other medications prescribed for you. Read the directions carefully, and ask  your doctor or other care provider to review them with you.                ASK your doctor about these medications      docusate sodium 100 MG capsule  Commonly known as: Colace  Take 1 capsule by mouth 2 times daily     Prenatal Vitamin 27-0.8 MG Tabs  Take 1 tablet by mouth daily               Where to Get Your Medications        These medications were sent to [id number] [place], [place] [address] [place] [address]     Phone: [phone]  cephALEXin 500 MG capsule  ondansetron 4 MG disintegrating tablet           DISCONTINUED MEDICATIONS:  Current Discharge Medication List          I am the Primary Clinician of Record: CAmada Jupiter MD (electronically signed)    (Please note that parts of this dictation were completed with voice recognition software. Quite often unanticipated grammatical, syntax, homophones, and other interpretive errors are inadvertently transcribed by the computer software. Please disregards these errors. Please excuse any errors that have escaped final proofreading.)     CAmada Jupiter MD  01/08/22 1240

## 2022-01-08 NOTE — ED Notes (Signed)
ASSUMED CARE OF PATIENT AT THIS TIME. PATIENT IN ULTRASOUND.     Rosezella Florida, RN  01/08/22 1229

## 2022-01-08 NOTE — Discharge Instructions (Signed)
Thank you!  Thank you for allowing me to care for you in the emergency department. It is my goal to provide you with excellent care. If you have not received excellent quality care, please ask to speak to the nurse manager. Please fill out the survey that will come to you by mail or email since we listen to your feedback!     Below you will find a list of your tests from today's visit.  Should you have any questions, please do not hesitate to call the emergency department.    Labs  Recent Results (from the past 12 hour(s))   CBC with Diff    Collection Time: 01/08/22  7:35 AM   Result Value Ref Range    WBC 3.9 3.6 - 11.0 K/uL    RBC 4.22 3.80 - 5.20 M/uL    Hemoglobin 10.5 (L) 11.5 - 16.0 g/dL    Hematocrit 31.6 (L) 35.0 - 47.0 %    MCV 74.9 (L) 80.0 - 99.0 FL    MCH 24.9 (L) 26.0 - 34.0 PG    MCHC 33.2 30.0 - 36.5 g/dL    RDW 18.9 (H) 11.5 - 14.5 %    Platelets 323 150 - 400 K/uL    MPV 8.8 (L) 8.9 - 12.9 FL    Nucleated RBCs 0.0 0.0 PER 100 WBC    nRBC 0.00 0.00 - 0.01 K/uL    Neutrophils % 60 32 - 75 %    Lymphocytes % 29 12 - 49 %    Monocytes % 9 5 - 13 %    Eosinophils % 1 0 - 7 %    Basophils % 1 0 - 1 %    Immature Granulocytes 0 0 - 0.5 %    Neutrophils Absolute 2.4 1.8 - 8.0 K/UL    Lymphocytes Absolute 1.2 0.8 - 3.5 K/UL    Monocytes Absolute 0.3 0.0 - 1.0 K/UL    Eosinophils Absolute 0.1 0.0 - 0.4 K/UL    Basophils Absolute 0.0 0.0 - 0.1 K/UL    Absolute Immature Granulocyte 0.0 0.00 - 0.04 K/UL    Differential Type AUTOMATED     CMP    Collection Time: 01/08/22  7:35 AM   Result Value Ref Range    Sodium 141 136 - 145 mmol/L    Potassium 3.7 3.5 - 5.1 mmol/L    Chloride 113 (H) 97 - 108 mmol/L    CO2 23 21 - 32 mmol/L    Anion Gap 5 5 - 15 mmol/L    Glucose 89 65 - 100 mg/dL    BUN 5 (L) 6 - 20 mg/dL    Creatinine 0.67 0.55 - 1.02 mg/dL    Bun/Cre Ratio 7 (L) 12 - 20      Est, Glom Filt Rate >60 >60 ml/min/1.72m    Calcium 8.4 (L) 8.5 - 10.1 mg/dL    Total Bilirubin 0.2 0.2 - 1.0 mg/dL    AST 16  15 - 37 U/L    ALT 14 12 - 78 U/L    Alk Phosphatase 44 (L) 45 - 117 U/L    Total Protein 6.5 6.4 - 8.2 g/dL    Albumin 2.9 (L) 3.5 - 5.0 g/dL    Globulin 3.6 2.0 - 4.0 g/dL    Albumin/Globulin Ratio 0.8 (L) 1.1 - 2.2     HCG, Quantitative, Pregnancy    Collection Time: 01/08/22  7:35 AM   Result Value Ref Range    hCG Quant  53,008 (H) 0 - 6 mIU/mL   Lipase    Collection Time: 01/08/22  7:35 AM   Result Value Ref Range    Lipase 61 (L) 73 - 393 U/L   TYPE AND SCREEN    Collection Time: 01/08/22  7:35 AM   Result Value Ref Range    Crossmatch expiration date 01/11/2022,2359     ABO/Rh O Positive     Antibody Screen Negative    Urinalysis with Microscopic    Collection Time: 01/08/22  9:38 AM   Result Value Ref Range    Color, UA Yellow/Straw      Appearance Clear Clear      Specific Gravity, UA 1.015 1.003 - 1.030      pH, Urine 7.0 5.0 - 8.0      Protein, UA Negative Negative mg/dL    Glucose, UA Negative Negative mg/dL    Ketones, Urine Negative Negative mg/dL    Bilirubin Urine Negative Negative      Blood, Urine Negative Negative      Urobilinogen, Urine 0.1 0.1 - 1.0 EU/dL    Nitrite, Urine Negative Negative      Leukocyte Esterase, Urine Negative Negative      WBC, UA 0-4 0 - 4 /hpf    RBC, UA 0-5 0 - 5 /hpf    Epithelial Cells UA Many (A) Few /lpf    BACTERIA, URINE 1+ (A) Negative /hpf    Mucus, UA Trace (A) Negative /lpf       Radiologic Studies  US OB LESS THAN 14 WEEKS SINGLE OR FIRST GESTATION   Final Result   Single live 8 week 2 day intrauterine pregnancy.             US OB TRANSVAGINAL    (Results Pending)     ------------------------------------------------------------------------------------------------------------  The exam and treatment you received in the Emergency Department were for an urgent problem and are not intended as complete care. It is important that you follow-up with a doctor, nurse practitioner, or physician assistant to:  (1) confirm your diagnosis,  (2) re-evaluation of changes in  your illness and treatment, and  (3) for ongoing care. Please take your discharge instructions with you when you go to your follow-up appointment.     If you have any problem arranging a follow-up appointment, contact the Emergency Department.  If your symptoms become worse or you do not improve as expected and you are unable to reach your health care provider, please return to the Emergency Department. We are available 24 hours a day.     If a prescription has been provided, please have it filled as soon as possible to prevent a delay in treatment. If you have any questions or reservations about taking the medication due to side effects or interactions with other medications, please call your primary care provider or contact the ER.

## 2022-01-12 DIAGNOSIS — O209 Hemorrhage in early pregnancy, unspecified: Secondary | ICD-10-CM

## 2022-01-12 DIAGNOSIS — O4690 Antepartum hemorrhage, unspecified, unspecified trimester: Secondary | ICD-10-CM

## 2022-01-12 NOTE — ED Triage Notes (Signed)
Abd pain x couple weeks.  Pt states she is [redacted] weeks pregnant and spotting.

## 2022-01-12 NOTE — ED Provider Notes (Signed)
[facility] DE[facility]  EMERGENCY DEPARTMENT HISTORY AND PHYSICAL EXAM      Date: 01/12/2022  Patient Name: Connie Morgan  MRN: [id number]  Birthdate: Aug 16, 1980  Date of evaluation: 01/12/2022  Provider: Driscilla Grammes, APRN - NP   Note Started: 10:33 PM EDT 01/12/22    HISTORY OF PRESENT ILLNESS     Chief Complaint   Patient presents with    Vaginal Bleeding    Abdominal Pain       History Provided By: Patient    HPI: Connie Morgan is a 41 y.o. female with past medical history of asthma, presents ambulatory to the emergency department with ongoing nominal discomfort and spotting, currently [redacted] weeks pregnant.  Seen in the ED on 8/5 for the same, had labs, UA, ultrasound; ultrasound confirmed 8-week 2-day IUP, blood work was unremarkable and UA c/f UTI so she was discharged with Abx.  Patient denies any new symptoms since being evaluated on 8/5, also denies fever/chills, chest pain, difficulty breathing, dysuria, hematuria, urinary frequency, diarrhea, rash, sick contacts.  Patient states she had a few episodes of NBNB emesis today, but she has been doing this since she found out she was pregnant. Upon arrival to the ED pt is alert and oriented x 3, well-appearing, and interacting appropriately; no obvious distress noted.     PAST MEDICAL HISTORY   Past Medical History:  Past Medical History:   Diagnosis Date    Asthma        Past Surgical History:  History reviewed. No pertinent surgical history.    Family History:  Family History   Problem Relation Age of Onset    No Known Problems Mother     No Known Problems Father        Social History:  Social History     Tobacco Use    Smoking status: Former     Packs/day: 0.25     Types: Cigarettes    Smokeless tobacco: Never    Tobacco comments:     Quit smoking: 1-2 cigarettes a day   Vaping Use    Vaping Use: Never used   Substance Use Topics    Alcohol use: No    Drug use: No       Allergies:  No Known Allergies    PCP: None None    Current Meds:   No current facility-administered  medications for this encounter.     Current Outpatient Medications   Medication Sig Dispense Refill    cephALEXin (KEFLEX) 500 MG capsule Take 1 capsule by mouth 2 times daily for 7 days 14 capsule 0    ondansetron (ZOFRAN-ODT) 4 MG disintegrating tablet Place 1 tablet under the tongue every 8 hours as needed for Nausea or Vomiting 20 tablet 0    ondansetron (ZOFRAN-ODT) 4 MG disintegrating tablet Place 1 tablet under the tongue every 8 hours as needed for Nausea or Vomiting 20 tablet 0    Prenatal Vit-Fe Fumarate-FA (PRENATAL VITAMIN) 27-0.8 MG TABS Take 1 tablet by mouth daily 30 tablet 0    docusate sodium (COLACE) 100 MG capsule Take 1 capsule by mouth 2 times daily 30 capsule 0       Social Determinants of Health:   Social Determinants of Health     Tobacco Use: Medium Risk    Smoking Tobacco Use: Former    Smokeless Tobacco Use: Never    Passive Exposure: Not on file   Alcohol Use: Not At Risk    Frequency of Alcohol Consumption:  Never    Average Number of Drinks: Patient does not drink    Frequency of Binge Drinking: Never   Physicist, medical Strain: Not on file   Food Insecurity: Not on file   Transportation Needs: Not on file   Physical Activity: Not on file   Stress: Not on file   Social Connections: Not on file   Intimate Partner Violence: Not on file   Depression: Not on file   Housing Stability: Not on file   Postpartum Depression: Not on file       PHYSICAL EXAM   Physical Exam  Constitutional:       General: She is not in acute distress.     Appearance: Normal appearance. She is normal weight. She is not ill-appearing, toxic-appearing or diaphoretic.   Cardiovascular:      Rate and Rhythm: Normal rate and regular rhythm.      Pulses: Normal pulses.      Heart sounds: Normal heart sounds.   Pulmonary:      Effort: Pulmonary effort is normal.      Breath sounds: Normal breath sounds.   Abdominal:      General: Abdomen is flat. Bowel sounds are normal. There is no distension.      Palpations: Abdomen is  soft. There is no mass.      Tenderness: There is no abdominal tenderness. There is no right CVA tenderness, left CVA tenderness, guarding or rebound.      Hernia: No hernia is present.   Musculoskeletal:      Right lower leg: No edema.      Left lower leg: No edema.   Skin:     General: Skin is warm and dry.      Coloration: Skin is not jaundiced or pale.      Findings: No bruising, erythema, lesion or rash.   Neurological:      Mental Status: She is alert and oriented to person, place, and time.   Psychiatric:         Mood and Affect: Mood normal.         Behavior: Behavior normal.         Thought Content: Thought content normal.         Judgment: Judgment normal.       SCREENINGS              LAB, EKG AND DIAGNOSTIC RESULTS   Labs:  Recent Results (from the past 12 hour(s))   HCG, Quantitative, Pregnancy    Collection Time: 01/12/22 10:36 PM   Result Value Ref Range    hCG Quant 45,714 (H) 0 - 6 mIU/mL       EKG: Not Applicable    Radiologic Studies:  Non-plain film images such as CT, Ultrasound and MRI are read by the radiologist. Plain radiographic images are visualized and preliminarily interpreted by the ED Physician with the following findings: Not Applicable.    Interpretation per the Radiologist below, if available at the time of this note:  No orders to display        ED COURSE and DIFFERENTIAL DIAGNOSIS/MDM   CC/HPI Summary, DDx, ED Course, and Reassessment.    Disposition Considerations (Tests not done, Shared Decision Making, Pt Expectation of Test or Treatment.):     Patient presents with ongoing diffuse abdominal discomfort and spotting, currently [redacted] weeks pregnant.  Seen in the ED on 8/5 for the same had a full workup which confirmed IUP, patient diagnosed with UTI  and discharged home with antibiotics.  She has not yet followed up with OB/GYN.  Denies any new symptoms, patient states "I just want to know what is going on so I do not have to keep coming up."   Patient states she has been taking  antibiotics as prescribed and remains asymptomatic.  I suspect these are normal pregnancy related symptoms, which could be exacerbated by her UTI.  Given her course of illness and physical exam, low suspicion for acute intra-abdominal process such as appendicitis, diverticulitis, cholecystitis, pancreatitis, kidney stone.  Patient had a full workup on 8/5 and denies any new symptoms since her last ED visit; will obtain beta-hCG quant but otherwise no further workup indicated at this time.  Will give IV fluid bolus, antiemetics, and Tylenol for her symptoms.    Ddx: Threatened miscarriage, subchorionic hemorrhage, UTI    Records Reviewed (source and summary of external notes): Prior medical records and Nursing notes    Vitals:    Vitals:    01/12/22 2216   BP: 107/72   Pulse: 75   Resp: 16   Temp: 98.7 F (37.1 C)   TempSrc: Oral   Weight: 75.8 kg (167 lb)   Height: 1.753 m (5\' 9" )        ED COURSE  ED Course as of 01/13/22 0018   Thu Jan 13, 2022   0015 hCG Quant(!): Jan 15, 2022  Discussed findings with patient, advised her to call her OB/GYN office when it opens later today and asked to be reevaluated in 48 hours to have her labs rechecked; she verbalized understanding.  Will discharge home with strict return precautions, patient in agreement with plan of care. [LM]      ED Course User Index  [LM] [id number], APRN - NP         Patient was given the following medications:  Medications   ondansetron (ZOFRAN-ODT) disintegrating tablet 4 mg (4 mg Oral Given 01/12/22 2239)   acetaminophen (TYLENOL) tablet 1,000 mg (1,000 mg Oral Given 01/12/22 2239)   sodium chloride 0.9 % bolus 1,000 mL (1,000 mLs IntraVENous New Bag 01/12/22 2243)       CONSULTS: (Who and What was discussed)  None     Social Determinants affecting Dx or Tx: None    Smoking Cessation: Not Applicable    PROCEDURES   Unless otherwise noted above, none.  Procedures      CRITICAL CARE TIME   Patient does not meet Critical Care Time, 0 minutes    FINAL IMPRESSION      1. Vaginal bleeding in pregnancy          DISPOSITION/PLAN   DISPOSITION Decision To Discharge 01/13/2022 12:16:49 AM    Discharge Note: The patient is stable for discharge home. The signs, symptoms, diagnosis, and discharge instructions have been discussed, understanding conveyed, and agreed upon. The patient is to follow up as recommended or return to ER should their symptoms worsen.      PATIENT REFERRED TO:  Your OBGYN      For reevaluation and to have beta hcg quant rechecked 03/15/2022 in the ER which is down from 53,714 on 01/08/22)        DISCHARGE MEDICATIONS:     Medication List        ASK your doctor about these medications      cephALEXin 500 MG capsule  Commonly known as: KEFLEX  Take 1 capsule by mouth 2 times daily for 7 days     docusate  sodium 100 MG capsule  Commonly known as: Colace  Take 1 capsule by mouth 2 times daily     * ondansetron 4 MG disintegrating tablet  Commonly known as: ZOFRAN-ODT  Place 1 tablet under the tongue every 8 hours as needed for Nausea or Vomiting     * ondansetron 4 MG disintegrating tablet  Commonly known as: ZOFRAN-ODT  Place 1 tablet under the tongue every 8 hours as needed for Nausea or Vomiting     Prenatal Vitamin 27-0.8 MG Tabs  Take 1 tablet by mouth daily           * This list has 2 medication(s) that are the same as other medications prescribed for you. Read the directions carefully, and ask your doctor or other care provider to review them with you.                    DISCONTINUED MEDICATIONS:  Current Discharge Medication List          I am the Primary Clinician of Record: Driscilla Grammes, APRN - NP (electronically signed)    (Please note that parts of this dictation were completed with voice recognition software. Quite often unanticipated grammatical, syntax, homophones, and other interpretive errors are inadvertently transcribed by the computer software. Please disregards these errors. Please excuse any errors that have escaped final proofreading.)     Driscilla Grammes, APRN - NP  01/13/22 0018

## 2022-01-13 ENCOUNTER — Inpatient Hospital Stay: Admit: 2022-01-13 | Discharge: 2022-01-13 | Disposition: A | Discharge status: Home / Self Care | Payer: MEDICARE

## 2022-01-13 LAB — HCG, QUANTITATIVE, PREGNANCY: hCG Quant: 45714 m[IU]/mL — ABNORMAL HIGH (ref 0–6)

## 2022-01-13 MED ORDER — ONDANSETRON 4 MG PO TBDP
4 MG | Freq: Once | ORAL | Status: AC
Start: 2022-01-13 — End: 2022-01-12
  Administered 2022-01-13: 03:00:00 4 mg via ORAL

## 2022-01-13 MED ORDER — SODIUM CHLORIDE 0.9 % IV BOLUS
0.9 % | Freq: Once | INTRAVENOUS | Status: AC
Start: 2022-01-13 — End: 2022-01-13
  Administered 2022-01-13: 03:00:00 1000 mL via INTRAVENOUS

## 2022-01-13 MED ORDER — ACETAMINOPHEN 500 MG PO TABS
500 MG | ORAL | Status: AC
Start: 2022-01-13 — End: 2022-01-12
  Administered 2022-01-13: 03:00:00 1000 mg via ORAL

## 2022-01-13 MED FILL — SODIUM CHLORIDE 0.9 % IV SOLN: 0.9 % | INTRAVENOUS | Qty: 1000

## 2022-01-13 NOTE — Discharge Instructions (Addendum)
Thank you!  Thank you for allowing me to care for you in the emergency department. It is my goal to provide you with excellent care. If you have not received excellent quality care, please ask to speak to the nurse manager. Please fill out the survey that will come to you by mail or email since we listen to your feedback!     Below you will find a list of your tests from today's visit.  Should you have any questions, please do not hesitate to call the emergency department.    Labs  Recent Results (from the past 12 hour(s))   HCG, Quantitative, Pregnancy    Collection Time: 01/12/22 10:36 PM   Result Value Ref Range    hCG Quant 45,714 (H) 0 - 6 mIU/mL       Radiologic Studies  No orders to display     ------------------------------------------------------------------------------------------------------------  The exam and treatment you received in the Emergency Department were for an urgent problem and are not intended as complete care. It is important that you follow-up with a doctor, nurse practitioner, or physician assistant to:  (1) confirm your diagnosis,  (2) re-evaluation of changes in your illness and treatment, and  (3) for ongoing care. Please take your discharge instructions with you when you go to your follow-up appointment.     If you have any problem arranging a follow-up appointment, contact the Emergency Department.  If your symptoms become worse or you do not improve as expected and you are unable to reach your health care provider, please return to the Emergency Department. We are available 24 hours a day.     If a prescription has been provided, please have it filled as soon as possible to prevent a delay in treatment. If you have any questions or reservations about taking the medication due to side effects or interactions with other medications, please call your primary care provider or contact the ER.

## 2022-01-24 ENCOUNTER — Emergency Department: Admit: 2022-01-24 | Payer: MEDICARE

## 2022-01-24 ENCOUNTER — Inpatient Hospital Stay
Admit: 2022-01-24 | Discharge: 2022-01-24 | Disposition: A | Discharge status: Home / Self Care | Payer: MEDICARE | Attending: Emergency Medicine

## 2022-01-24 DIAGNOSIS — W19XXXA Unspecified fall, initial encounter: Secondary | ICD-10-CM

## 2022-01-24 DIAGNOSIS — O039 Complete or unspecified spontaneous abortion without complication: Secondary | ICD-10-CM

## 2022-01-24 LAB — CBC WITH AUTO DIFFERENTIAL
Absolute Immature Granulocyte: 0 10*3/uL (ref 0.00–0.04)
Basophils %: 0 % (ref 0–1)
Basophils Absolute: 0 10*3/uL (ref 0.0–0.1)
Eosinophils %: 1 % (ref 0–7)
Eosinophils Absolute: 0 10*3/uL (ref 0.0–0.4)
Hematocrit: 30.7 % — ABNORMAL LOW (ref 35.0–47.0)
Hemoglobin: 10.6 g/dL — ABNORMAL LOW (ref 11.5–16.0)
Immature Granulocytes: 0 % (ref 0–0.5)
Lymphocytes %: 29 % (ref 12–49)
Lymphocytes Absolute: 1.3 10*3/uL (ref 0.8–3.5)
MCH: 25.1 PG — ABNORMAL LOW (ref 26.0–34.0)
MCHC: 34.5 g/dL (ref 30.0–36.5)
MCV: 72.7 FL — ABNORMAL LOW (ref 80.0–99.0)
MPV: 8.8 FL — ABNORMAL LOW (ref 8.9–12.9)
Monocytes %: 8 % (ref 5–13)
Monocytes Absolute: 0.4 10*3/uL (ref 0.0–1.0)
Neutrophils %: 62 % (ref 32–75)
Neutrophils Absolute: 2.8 10*3/uL (ref 1.8–8.0)
Nucleated RBCs: 0 PER 100 WBC
Platelets: 363 10*3/uL (ref 150–400)
RBC: 4.22 M/uL (ref 3.80–5.20)
RDW: 18.6 % — ABNORMAL HIGH (ref 11.5–14.5)
WBC: 4.6 10*3/uL (ref 3.6–11.0)
nRBC: 0 10*3/uL (ref 0.00–0.01)

## 2022-01-24 LAB — COMPREHENSIVE METABOLIC PANEL
ALT: 14 U/L (ref 12–78)
AST: 11 U/L — ABNORMAL LOW (ref 15–37)
Albumin/Globulin Ratio: 0.9 — ABNORMAL LOW (ref 1.1–2.2)
Albumin: 3.3 g/dL — ABNORMAL LOW (ref 3.5–5.0)
Alk Phosphatase: 53 U/L (ref 45–117)
Anion Gap: 8 mmol/L (ref 5–15)
BUN: 10 mg/dL (ref 6–20)
Bun/Cre Ratio: 15 (ref 12–20)
CO2: 23 mmol/L (ref 21–32)
Calcium: 9 mg/dL (ref 8.5–10.1)
Chloride: 108 mmol/L (ref 97–108)
Creatinine: 0.66 mg/dL (ref 0.55–1.02)
Est, Glom Filt Rate: >60 mL/min/{1.73_m2} (ref >60)
Globulin: 3.7 g/dL (ref 2.0–4.0)
Glucose: 99 mg/dL (ref 65–100)
Potassium: 4 mmol/L (ref 3.5–5.1)
Sodium: 139 mmol/L (ref 136–145)
Total Bilirubin: 0.2 mg/dL (ref 0.2–1.0)
Total Protein: 7 g/dL (ref 6.4–8.2)

## 2022-01-24 LAB — URINALYSIS WITH MICROSCOPIC
Bilirubin Urine: NEGATIVE
Blood, Urine: NEGATIVE
Glucose, UA: NEGATIVE mg/dL
Ketones, Urine: NEGATIVE mg/dL
Nitrite, Urine: NEGATIVE
Protein, UA: NEGATIVE mg/dL
Specific Gravity, UA: 1.02 (ref 1.003–1.030)
Urobilinogen, Urine: 0.1 EU/dL (ref 0.1–1.0)
pH, Urine: 5 (ref 5.0–8.0)

## 2022-01-24 LAB — HCG, QUANTITATIVE, PREGNANCY: hCG Quant: 16865 m[IU]/mL — ABNORMAL HIGH (ref 0–6)

## 2022-01-24 MED ORDER — ACETAMINOPHEN 500 MG PO TABS
500 MG | ORAL | Status: AC
Start: 2022-01-24 — End: 2022-01-24
  Administered 2022-01-24: 13:00:00 1000 mg via ORAL

## 2022-01-24 NOTE — Discharge Instructions (Signed)
Thank you!  Thank you for allowing me to care for you in the emergency department. It is my goal to provide you with excellent care. If you have not received excellent quality care, please ask to speak to the nurse manager. Please fill out the survey that will come to you by mail or email since we listen to your feedback!     Below you will find a list of your tests from today's visit.  Should you have any questions, please do not hesitate to call the emergency department.    Labs  Recent Results (from the past 12 hour(s))   Urinalysis with Microscopic    Collection Time: 01/24/22  7:40 AM   Result Value Ref Range    Color, UA Yellow/Straw      Appearance Turbid (A) Clear      Specific Gravity, UA 1.020 1.003 - 1.030      pH, Urine 5.0 5.0 - 8.0      Protein, UA Negative Negative mg/dL    Glucose, UA Negative Negative mg/dL    Ketones, Urine Negative Negative mg/dL    Bilirubin Urine Negative Negative      Blood, Urine Negative Negative      Urobilinogen, Urine 0.1 0.1 - 1.0 EU/dL    Nitrite, Urine Negative Negative      Leukocyte Esterase, Urine Trace (A) Negative      WBC, UA 5-10 0 - 4 /hpf    RBC, UA 0-5 0 - 5 /hpf    Epithelial Cells UA Many (A) Few /lpf    BACTERIA, URINE 1+ (A) Negative /hpf    Mucus, UA Trace (A) Negative /lpf   CBC with Auto Differential    Collection Time: 01/24/22  7:40 AM   Result Value Ref Range    WBC 4.6 3.6 - 11.0 K/uL    RBC 4.22 3.80 - 5.20 M/uL    Hemoglobin 10.6 (L) 11.5 - 16.0 g/dL    Hematocrit 30.7 (L) 35.0 - 47.0 %    MCV 72.7 (L) 80.0 - 99.0 FL    MCH 25.1 (L) 26.0 - 34.0 PG    MCHC 34.5 30.0 - 36.5 g/dL    RDW 18.6 (H) 11.5 - 14.5 %    Platelets 363 150 - 400 K/uL    MPV 8.8 (L) 8.9 - 12.9 FL    Nucleated RBCs 0.0 0.0 PER 100 WBC    nRBC 0.00 0.00 - 0.01 K/uL    Neutrophils % 62 32 - 75 %    Lymphocytes % 29 12 - 49 %    Monocytes % 8 5 - 13 %    Eosinophils % 1 0 - 7 %    Basophils % 0 0 - 1 %    Immature Granulocytes 0 0 - 0.5 %    Neutrophils Absolute 2.8 1.8 - 8.0  K/UL    Lymphocytes Absolute 1.3 0.8 - 3.5 K/UL    Monocytes Absolute 0.4 0.0 - 1.0 K/UL    Eosinophils Absolute 0.0 0.0 - 0.4 K/UL    Basophils Absolute 0.0 0.0 - 0.1 K/UL    Absolute Immature Granulocyte 0.0 0.00 - 0.04 K/UL    Differential Type AUTOMATED     CMP    Collection Time: 01/24/22  7:40 AM   Result Value Ref Range    Sodium 139 136 - 145 mmol/L    Potassium 4.0 3.5 - 5.1 mmol/L    Chloride 108 97 - 108 mmol/L    CO2  23 21 - 32 mmol/L    Anion Gap 8 5 - 15 mmol/L    Glucose 99 65 - 100 mg/dL    BUN 10 6 - 20 mg/dL    Creatinine 0.66 0.55 - 1.02 mg/dL    Bun/Cre Ratio 15 12 - 20      Est, Glom Filt Rate >60 >60 ml/min/1.23m    Calcium 9.0 8.5 - 10.1 mg/dL    Total Bilirubin 0.2 0.2 - 1.0 mg/dL    AST 11 (L) 15 - 37 U/L    ALT 14 12 - 78 U/L    Alk Phosphatase 53 45 - 117 U/L    Total Protein 7.0 6.4 - 8.2 g/dL    Albumin 3.3 (L) 3.5 - 5.0 g/dL    Globulin 3.7 2.0 - 4.0 g/dL    Albumin/Globulin Ratio 0.9 (L) 1.1 - 2.2     ABO/RH    Collection Time: 01/24/22  7:40 AM   Result Value Ref Range    ABO/Rh O Positive    HCG, Quantitative, Pregnancy    Collection Time: 01/24/22  7:40 AM   Result Value Ref Range    hCG Quant 16,865 (H) 0 - 6 mIU/mL       Radiologic Studies  [place] LESS THAN 14 WEEKS SINGLE OR FIRST GESTATION   Final Result   Single fundic intrauterine pregnancy with evidence for subchorionic   hematoma. No fetal cardiac activity is demonstrated in fetus of gestational age   874 weeks 5 days. Findings are consistent with fetal demise.      The findings were called to nurse Becca on 01/24/2022 at 1005 hours by   [employer] Assistant was placed on hold for 20 minutes. 789               [place] TRANSVAGINAL   Final Result   Single fundic intrauterine pregnancy with evidence for subchorionic   hematoma. No fetal cardiac activity is demonstrated in fetus of gestational age   863 weeks 5 days. Findings are consistent with fetal demise.      The findings were called to nurse Becca on  01/24/2022 at 1005 hours by   [employer] Assistant was placed on hold for 20 minutes. 789               [place] LIMITED   Final Result   Contracted gallbladder. Patient ate prior to the examination. No   gallstones or biliary ductal dilation demonstrated.           ------------------------------------------------------------------------------------------------------------  The exam and treatment you received in the Emergency Department were for an urgent problem and are not intended as complete care. It is important that you follow-up with a doctor, nurse practitioner, or physician assistant to:  (1) confirm your diagnosis,  (2) re-evaluation of changes in your illness and treatment, and  (3) for ongoing care. Please take your discharge instructions with you when you go to your follow-up appointment.     If you have any problem arranging a follow-up appointment, contact the Emergency Department.  If your symptoms become worse or you do not improve as expected and you are unable to reach your health care provider, please return to the Emergency Department. We are available 24 hours a day.     If a prescription has been provided, please have it filled as soon as possible to prevent a delay in treatment. If you have any questions or reservations about taking the  medication due to side effects or interactions with other medications, please call your primary care provider or contact the ER.

## 2022-01-24 NOTE — ED Triage Notes (Signed)
Patient states she fell about 1 hour ago and now hurting on right side of abd and flank area. Patient states she is [redacted] weeks pregnant. Denies any bleeding.

## 2022-01-24 NOTE — ED Provider Notes (Signed)
[facility]  EMERGENCY DEPARTMENT HISTORY AND PHYSICAL EXAM      Date: 01/24/2022  Patient Name: Connie Morgan  MRN: [id number]  [place] 08-22-1980  Date of evaluation: 01/24/2022  Provider: Ander Gaster, MD   Note Started: 12:02 PM EDT 01/24/22    HISTORY OF PRESENT ILLNESS     Chief Complaint   Patient presents with    Fall    Flank Pain       History Provided By: Patient    HPI: Connie Morgan is a 41 y.o. female patient suffered a ground-level fall impacting her abdomen approximate 1 hour ago.  Has residual right flank pain is minimal.  No meds prior to arrival.  No vaginal bleeding no gush of fluid.  She is approximately [redacted] weeks pregnant.  No head injury.    PAST MEDICAL HISTORY   Past Medical History:  Past Medical History:   Diagnosis Date    Asthma        Past Surgical History:  No past surgical history on file.    Family History:  Family History   Problem Relation Age of Onset    No Known Problems Mother     No Known Problems Father        Social History:  Social History     Tobacco Use    Smoking status: Former     Packs/day: 0.25     Types: Cigarettes    Smokeless tobacco: Never    Tobacco comments:     Quit smoking: 1-2 cigarettes a day   Vaping Use    Vaping Use: Never used   Substance Use Topics    Alcohol use: No    Drug use: No       Allergies:  No Known Allergies    PCP: None None (Inactive)    Current Meds:   No current facility-administered medications for this encounter.     Current Outpatient Medications   Medication Sig Dispense Refill    ondansetron (ZOFRAN-ODT) 4 MG disintegrating tablet Place 1 tablet under the tongue every 8 hours as needed for Nausea or Vomiting 20 tablet 0    ondansetron (ZOFRAN-ODT) 4 MG disintegrating tablet Place 1 tablet under the tongue every 8 hours as needed for Nausea or Vomiting 20 tablet 0    Prenatal Vit-Fe Fumarate-FA (PRENATAL VITAMIN) 27-0.8 MG TABS Take 1 tablet by mouth daily 30 tablet 0    docusate sodium (COLACE) 100 MG capsule Take 1 capsule by  mouth 2 times daily 30 capsule 0       Social Determinants of Health:   Social Determinants of Health     Tobacco Use: Medium Risk    Smoking Tobacco Use: Former    Smokeless Tobacco Use: Never    Passive Exposure: Not on file   Alcohol Use: Not At Risk    Frequency of Alcohol Consumption: Never    Average Number of Drinks: Patient does not drink    Frequency of Binge Drinking: Never   Merchant navy officer: Not on Training and development officer Insecurity: Not on file   Transportation Needs: Not on file   Physical Activity: Not on file   Stress: Not on file   Social Connections: Not on file   Intimate Partner Violence: Not on file   Depression: Not on file   Housing Stability: Not on file   Postpartum Depression: Not on file       PHYSICAL EXAM   Physical Exam  Constitutional:  General: She is not in acute distress.     Appearance: Normal appearance.   HENT:      Head: Normocephalic and atraumatic.      Right Ear: External ear normal.      Left Ear: External ear normal.      Nose: Nose normal. No congestion or rhinorrhea.      Mouth/Throat:      Mouth: Mucous membranes are moist.   Eyes:      Extraocular Movements: Extraocular movements intact.      Conjunctiva/sclera: Conjunctivae normal.      Pupils: Pupils are equal, round, and reactive to light.   Cardiovascular:      Rate and Rhythm: Normal rate and regular rhythm.      Pulses: Normal pulses.      Heart sounds: Normal heart sounds.   Pulmonary:      Effort: Pulmonary effort is normal. No respiratory distress.      Breath sounds: Normal breath sounds.   Abdominal:      General: Abdomen is flat. There is no distension.      Tenderness: There is no abdominal tenderness.   Musculoskeletal:         General: No swelling or deformity. Normal range of motion.      Cervical back: Normal range of motion.   Skin:     General: Skin is warm.      Coloration: Skin is not jaundiced or pale.   Neurological:      General: No focal deficit present.      Mental Status: She is alert and  oriented to person, place, and time. Mental status is at baseline.   Psychiatric:         Mood and Affect: Mood normal.         Behavior: Behavior normal.         Thought Content: Thought content normal.         Judgment: Judgment normal.         SCREENINGS               LAB, EKG AND DIAGNOSTIC RESULTS   Labs:  Recent Results (from the past 12 hour(s))   Urinalysis with Microscopic    Collection Time: 01/24/22  7:40 AM   Result Value Ref Range    Color, UA Yellow/Straw      Appearance Turbid (A) Clear      Specific Gravity, UA 1.020 1.003 - 1.030      pH, Urine 5.0 5.0 - 8.0      Protein, UA Negative Negative mg/dL    Glucose, UA Negative Negative mg/dL    Ketones, Urine Negative Negative mg/dL    Bilirubin Urine Negative Negative      Blood, Urine Negative Negative      Urobilinogen, Urine 0.1 0.1 - 1.0 EU/dL    Nitrite, Urine Negative Negative      Leukocyte Esterase, Urine Trace (A) Negative      WBC, UA 5-10 0 - 4 /hpf    RBC, UA 0-5 0 - 5 /hpf    Epithelial Cells UA Many (A) Few /lpf    BACTERIA, URINE 1+ (A) Negative /hpf    Mucus, UA Trace (A) Negative /lpf   CBC with Auto Differential    Collection Time: 01/24/22  7:40 AM   Result Value Ref Range    WBC 4.6 3.6 - 11.0 K/uL    RBC 4.22 3.80 - 5.20 M/uL    Hemoglobin 10.6 (  L) 11.5 - 16.0 g/dL    Hematocrit 30.7 (L) 35.0 - 47.0 %    MCV 72.7 (L) 80.0 - 99.0 FL    MCH 25.1 (L) 26.0 - 34.0 PG    MCHC 34.5 30.0 - 36.5 g/dL    RDW 18.6 (H) 11.5 - 14.5 %    Platelets 363 150 - 400 K/uL    MPV 8.8 (L) 8.9 - 12.9 FL    Nucleated RBCs 0.0 0.0 PER 100 WBC    nRBC 0.00 0.00 - 0.01 K/uL    Neutrophils % 62 32 - 75 %    Lymphocytes % 29 12 - 49 %    Monocytes % 8 5 - 13 %    Eosinophils % 1 0 - 7 %    Basophils % 0 0 - 1 %    Immature Granulocytes 0 0 - 0.5 %    Neutrophils Absolute 2.8 1.8 - 8.0 K/UL    Lymphocytes Absolute 1.3 0.8 - 3.5 K/UL    Monocytes Absolute 0.4 0.0 - 1.0 K/UL    Eosinophils Absolute 0.0 0.0 - 0.4 K/UL    Basophils Absolute 0.0 0.0 - 0.1 K/UL     Absolute Immature Granulocyte 0.0 0.00 - 0.04 K/UL    Differential Type AUTOMATED     CMP    Collection Time: 01/24/22  7:40 AM   Result Value Ref Range    Sodium 139 136 - 145 mmol/L    Potassium 4.0 3.5 - 5.1 mmol/L    Chloride 108 97 - 108 mmol/L    CO2 23 21 - 32 mmol/L    Anion Gap 8 5 - 15 mmol/L    Glucose 99 65 - 100 mg/dL    BUN 10 6 - 20 mg/dL    Creatinine 0.66 0.55 - 1.02 mg/dL    Bun/Cre Ratio 15 12 - 20      Est, Glom Filt Rate >60 >60 ml/min/1.52m    Calcium 9.0 8.5 - 10.1 mg/dL    Total Bilirubin 0.2 0.2 - 1.0 mg/dL    AST 11 (L) 15 - 37 U/L    ALT 14 12 - 78 U/L    Alk Phosphatase 53 45 - 117 U/L    Total Protein 7.0 6.4 - 8.2 g/dL    Albumin 3.3 (L) 3.5 - 5.0 g/dL    Globulin 3.7 2.0 - 4.0 g/dL    Albumin/Globulin Ratio 0.9 (L) 1.1 - 2.2     ABO/RH    Collection Time: 01/24/22  7:40 AM   Result Value Ref Range    ABO/Rh O Positive    HCG, Quantitative, Pregnancy    Collection Time: 01/24/22  7:40 AM   Result Value Ref Range    hCG Quant 16,865 (H) 0 - 6 mIU/mL       EKG:.Not Applicable    Radiologic Studies:  Non-plain film images such as CT, Ultrasound and MRI are read by the radiologist. Plain radiographic images are visualized and preliminarily interpreted by the ED Provider with the following findings: See ED Course Below    Interpretation per the Radiologist below, if available at the time of this note:  [place]  Final Result   Single fundic intrauterine pregnancy with evidence for subchorionic   hematoma. No fetal cardiac activity is demonstrated in fetus of gestational age   876 weeks 5 days. Findings are consistent with fetal demise.      The  findings were called to nurse Selena Batten on 01/24/2022 at 1005 hours by   [employer]. Assistant was placed on hold for 20 minutes. 789               US OB TRANSVAGINAL   Final Result   Single fundic intrauterine pregnancy with evidence for subchorionic   hematoma. No fetal cardiac activity is  demonstrated in fetus of gestational age   [redacted] weeks, 5 days. Findings are consistent with fetal demise.      The findings were called to nurse Becca on 01/24/2022 at 1005 hours by   [employer]. Assistant was placed on hold for 20 minutes. 789               US ABDOMEN LIMITED   Final Result   Contracted gallbladder. Patient ate prior to the examination. No   gallstones or biliary ductal dilation demonstrated.              EMERGENCY DEPARTMENT COURSE and DIFFERENTIAL DIAGNOSIS/MDM   CC/HPI Summary, DDx, ED Course, and Reassessment: 41 year old female ground-level fall with abdominal flank impact who is [redacted] weeks pregnant.  Vitals are stable.  Benign exam.  Plan for screening lab work ultrasound urine and reassessment.  Will evaluate for intra abdominal injury versus kidney injury versus fetal injury.  No evidence of any fracture.    Clinical Management Tools:  Not Applicable    Records Reviewed (source and summary of external notes): Prior medical records and Nursing notes    Vitals:    Vitals:    01/24/22 0641   BP: 120/69   Pulse: 72   Resp: 18   Temp: 97.9 F (36.6 C)   TempSrc: Oral   SpO2: 100%   Weight: 75.8 kg (167 lb)   Height: 1.753 m ('5\' 9"' )        ED COURSE  ED Course as of 01/24/22 1202   Mon Jan 24, 2022   0852 hCG Quant(!): 79,024  Consistent with being [redacted] weeks pregnant by dates. [HP]   [phone] ABO Rh: O Positive  Lab work interpreted independently by ER physician as NORMAL  .  No RhoGAM indicated. [HP]   5329 CMP(!):    Sodium 139   Potassium 4.0   Chloride 108   CO2 23   Anion Gap 8   Glucose, Random 99   BUN,BUNPL 10   Creatinine 0.66   Bun/Cre Ratio 15   Est, Glom Filt Rate >60   CALCIUM, SERUM, 500694 9.0   BILIRUBIN TOTAL 0.2   AST 11(!)   ALT 14   Alk Phos 53   Total Protein 7.0   Albumin 3.3(!)   Globulin 3.7   ALBUMIN/GLOBULIN RATIO 0.9(!)  Lab work interpreted independently by ER physician as NORMAL   [HP]   0853 Hemoglobin Quant(!): 10.6  anemia [HP]   0853 WBC: 4.6  Lab work  interpreted independently by ER physician as NORMAL   [HP]   [phone] Urinalysis with Microscopic(!):    Color, UA Yellow/Straw   Appearance Turbid(!)   Specific Gravity, UA 1.020   pH, Urine 5.0   Protein, UA Negative   Glucose, UA Negative   Ketones, Urine Negative   Bilirubin, Urine Negative   Blood, Urine Negative   Urobilinogen, Urine 0.1   Nitrite, Urine Negative   Leukocyte Esterase, Urine Trace(!)   WBC, UA 5-10   RBC, UA 0-5   Epithelial Cells, UA Many(!)   Bacteria, UA 1+Marland Kitchen)  Mucus, UA Trace(!)  Lab work interpreted independently by ER physician as NORMAL   [HP]   1011 [place]:    FINDINGS: The gallbladder is contracted with mural thickness of 2 mm. The  patient reports eating at 5:30 AM. No gallstone is shown. No intrahepatic or  intrahepatic biliary duct dilation is demonstrated. The common hepatic duct  measures 4 mm. Hepatic echotexture is normal. No hepatic mass is shown. Portal  venous flow is toward the liver with Main portal vein diameter of 12 mm and  velocity of 23 cm/s. The visualized portions of the pancreatic head and body  appear normal. The right kidney is normal with length of 11.4 cm.     IMPRESSION:  Contracted gallbladder. Patient ate prior to the examination. No  gallstones or biliary ductal dilation demonstrated. [HP]   1012 [place] pelv: IMPRESSION:  Single fundic intrauterine pregnancy with evidence for subchorionic  hematoma. No fetal cardiac activity is demonstrated in fetus of gestational age  [redacted] weeks, 5 days. Findings are consistent with fetal demise.     The findings were called to nurse Becca on 01/24/2022 at 1005 hours by  [employer]. Assistant was placed on hold for 20 minutes. 74 [HP]      ED Course User Index  [HP] Ander Gaster, MD       Disposition Considerations (Tests not done, Shared Decision Making, Pt Expectation of Test or Treatment.):  No evidence of any injury related to her fall or trauma.  Ultrasound negative.  Patient does have an unfortunate  ultrasound showing fetal demise.  I do not feel that this is related to the actual fall or trauma.  Patient does have a doctor at [place] that she can follow-up with.  Comfortable with discharge home understands the inevitable miscarriage and fetal demise     Patient was given the following medications:  Medications   acetaminophen (TYLENOL) tablet 1,000 mg (1,000 mg Oral Given 01/24/22 0840)       CONSULTS: (Who and What was discussed)  None     Social Determinants affecting Dx or Tx: None    Smoking Cessation: Not Applicable    PROCEDURES   Unless otherwise noted above, none  Procedures      CRITICAL CARE TIME   Patient does not meet Critical Care Time, 0 minutes    ED FINAL IMPRESSION     1. Fall, initial encounter    2. Right flank pain    3. Fetal demise before 20 weeks with retention of dead fetus          DISPOSITION/PLAN   DISPOSITION Decision To Discharge 01/24/2022 10:13:01 AM    Discharge Note: The patient is stable for discharge home. The signs, symptoms, diagnosis, and discharge instructions have been discussed, understanding conveyed, and agreed upon. The patient is to follow up as recommended or return to ER should their symptoms worsen.      PATIENT REFERRED TO:  Biagio Quint, MD  [address]  [place] [address]  [phone]    Schedule an appointment as soon as possible for a visit in 2 days  OR your OBGYN        DISCHARGE MEDICATIONS:     Medication List        ASK your doctor about these medications      docusate sodium 100 MG capsule  Commonly known as: Colace  Take 1 capsule by mouth 2 times daily     * ondansetron 4 MG  disintegrating tablet  Commonly known as: ZOFRAN-ODT  Place 1 tablet under the tongue every 8 hours as needed for Nausea or Vomiting     * ondansetron 4 MG disintegrating tablet  Commonly known as: ZOFRAN-ODT  Place 1 tablet under the tongue every 8 hours as needed for Nausea or Vomiting     Prenatal Vitamin 27-0.8 MG Tabs  Take 1 tablet by mouth daily            * This list has 2 medication(s) that are the same as other medications prescribed for you. Read the directions carefully, and ask your doctor or other care provider to review them with you.                    DISCONTINUED MEDICATIONS:  Discharge Medication List as of 01/24/2022 10:15 AM          I am the Primary Clinician of Record. Ander Gaster, MD (electronically signed)    (Please note that parts of this dictation were completed with voice recognition software. Quite often unanticipated grammatical, syntax, homophones, and other interpretive errors are inadvertently transcribed by the computer software. Please disregards these errors. Please excuse any errors that have escaped final proofreading.)       Ander Gaster, MD  01/24/22 1204

## 2022-01-25 LAB — ABO/RH: ABO/Rh: O POS

## 2022-01-29 ENCOUNTER — Inpatient Hospital Stay
Admit: 2022-01-29 | Discharge: 2022-01-30 | Disposition: A | Discharge status: Home / Self Care | Payer: MEDICARE | Attending: Emergency Medicine

## 2022-01-29 DIAGNOSIS — O034 Incomplete spontaneous abortion without complication: Secondary | ICD-10-CM

## 2022-01-29 LAB — COMPREHENSIVE METABOLIC PANEL
ALT: 18 U/L (ref 12–78)
AST: 14 U/L — ABNORMAL LOW (ref 15–37)
Albumin/Globulin Ratio: 0.8 — ABNORMAL LOW (ref 1.1–2.2)
Albumin: 3.3 g/dL — ABNORMAL LOW (ref 3.5–5.0)
Alk Phosphatase: 55 U/L (ref 45–117)
Anion Gap: 6 mmol/L (ref 5–15)
BUN: 11 mg/dL (ref 6–20)
Bun/Cre Ratio: 13 (ref 12–20)
CO2: 23 mmol/L (ref 21–32)
Calcium: 8.9 mg/dL (ref 8.5–10.1)
Chloride: 109 mmol/L — ABNORMAL HIGH (ref 97–108)
Creatinine: 0.83 mg/dL (ref 0.55–1.02)
Est, Glom Filt Rate: >60 mL/min/{1.73_m2} (ref >60)
Globulin: 4.1 g/dL — ABNORMAL HIGH (ref 2.0–4.0)
Glucose: 101 mg/dL — ABNORMAL HIGH (ref 65–100)
Potassium: 3.8 mmol/L (ref 3.5–5.1)
Sodium: 138 mmol/L (ref 136–145)
Total Bilirubin: 0.1 mg/dL — ABNORMAL LOW (ref 0.2–1.0)
Total Protein: 7.4 g/dL (ref 6.4–8.2)

## 2022-01-29 LAB — CBC WITH AUTO DIFFERENTIAL
Absolute Immature Granulocyte: 0 10*3/uL (ref 0.00–0.04)
Basophils %: 0 % (ref 0–1)
Basophils Absolute: 0 10*3/uL (ref 0.0–0.1)
Eosinophils %: 2 % (ref 0–7)
Eosinophils Absolute: 0.1 10*3/uL (ref 0.0–0.4)
Hematocrit: 32.7 % — ABNORMAL LOW (ref 35.0–47.0)
Hemoglobin: 11.1 g/dL — ABNORMAL LOW (ref 11.5–16.0)
Immature Granulocytes: 0 % (ref 0–0.5)
Lymphocytes %: 39 % (ref 12–49)
Lymphocytes Absolute: 1.7 10*3/uL (ref 0.8–3.5)
MCH: 25.5 PG — ABNORMAL LOW (ref 26.0–34.0)
MCHC: 33.9 g/dL (ref 30.0–36.5)
MCV: 75 FL — ABNORMAL LOW (ref 80.0–99.0)
MPV: 9.2 FL (ref 8.9–12.9)
Monocytes %: 9 % (ref 5–13)
Monocytes Absolute: 0.4 10*3/uL (ref 0.0–1.0)
Neutrophils %: 50 % (ref 32–75)
Neutrophils Absolute: 2.2 10*3/uL (ref 1.8–8.0)
Nucleated RBCs: 0 PER 100 WBC
Platelets: 371 10*3/uL (ref 150–400)
RBC: 4.36 M/uL (ref 3.80–5.20)
RDW: 18.2 % — ABNORMAL HIGH (ref 11.5–14.5)
WBC: 4.5 10*3/uL (ref 3.6–11.0)
nRBC: 0 10*3/uL (ref 0.00–0.01)

## 2022-01-29 LAB — PROTIME-INR
INR: 0.8 — ABNORMAL LOW (ref 0.9–1.1)
Protime: 11.8 s — ABNORMAL LOW (ref 11.9–14.6)

## 2022-01-29 MED ORDER — MORPHINE SULFATE (PF) 4 MG/ML IV SOLN
4 MG/ML | INTRAVENOUS | Status: AC
Start: 2022-01-29 — End: 2022-01-29
  Administered 2022-01-29: 23:00:00 4 mg via INTRAVENOUS

## 2022-01-29 MED ORDER — LACTATED RINGERS IV BOLUS
Freq: Once | INTRAVENOUS | Status: AC
Start: 2022-01-29 — End: 2022-01-29
  Administered 2022-01-29: 23:00:00 1000 mL via INTRAVENOUS

## 2022-01-29 MED FILL — LACTATED RINGERS IV SOLN: INTRAVENOUS | Qty: 1000

## 2022-01-29 NOTE — ED Triage Notes (Signed)
Pt presents with miscarriage x4 days ago, she was approx 10wk. Followed up with OB and was given pills she doesn't remember the name of to pass products. C/o increasing abd pain and vaginal bleeding.

## 2022-01-29 NOTE — ED Provider Notes (Signed)
[facility] EMERGENCY DEPT  EMERGENCY DEPARTMENT HISTORY AND PHYSICAL EXAM      Date: 01/29/2022  Patient Name: Connie Morgan  MRN: [id number]  Ashley: 12-20-1980  Date of evaluation: 01/29/2022  Provider: Earnestine Mealing, MD   Note Started: 9:05 PM EDT 01/29/22    HISTORY OF PRESENT ILLNESS     Chief Complaint   Patient presents with    Abdominal Pain    Vaginal Bleeding    Miscarriage       History Provided By: Patient    HPI: Connie Morgan is a 41 y.o. female presents emergency department for abdominal pain vaginal bleeding.  Patient was diagnosed with intrauterine fetal demise approximately 4 days ago at 61 weeks.  Patient was given mifepristone, started taking today and noted severe abdominal cramping and vaginal bleeding.  Patient denies any nausea vomiting no fevers chills, currently not having any active vaginal bleeding.    PAST MEDICAL HISTORY   Past Medical History:  Past Medical History:   Diagnosis Date    Asthma        Past Surgical History:  No past surgical history on file.    Family History:  Family History   Problem Relation Age of Onset    No Known Problems Mother     No Known Problems Father        Social History:  Social History     Tobacco Use    Smoking status: Former     Packs/day: 0.25     Types: Cigarettes    Smokeless tobacco: Never    Tobacco comments:     Quit smoking: 1-2 cigarettes a day   Vaping Use    Vaping Use: Never used   Substance Use Topics    Alcohol use: No    Drug use: No       Allergies:  No Known Allergies    PCP: None None    Current Meds:   No current facility-administered medications for this encounter.     Current Outpatient Medications   Medication Sig Dispense Refill    miSOPROStol (CYTOTEC) 100 MCG tablet Take 4 tablets by mouth 4 times daily for 3 days 48 tablet 0    oxyCODONE-acetaminophen (PERCOCET) 5-325 MG per tablet Take 1 tablet by mouth every 6 hours as needed for Pain for up to 3 days. Intended supply: 3 days. Take lowest dose possible to manage pain Max Daily Amount: 4  tablets 12 tablet 0    ondansetron (ZOFRAN-ODT) 4 MG disintegrating tablet Place 1 tablet under the tongue every 8 hours as needed for Nausea or Vomiting 20 tablet 0    ondansetron (ZOFRAN-ODT) 4 MG disintegrating tablet Place 1 tablet under the tongue every 8 hours as needed for Nausea or Vomiting 20 tablet 0    Prenatal Vit-Fe Fumarate-FA (PRENATAL VITAMIN) 27-0.8 MG TABS Take 1 tablet by mouth daily 30 tablet 0    docusate sodium (COLACE) 100 MG capsule Take 1 capsule by mouth 2 times daily 30 capsule 0       Social Determinants of Health:   Social Determinants of Health     Tobacco Use: Medium Risk    Smoking Tobacco Use: Former    Smokeless Tobacco Use: Never    Passive Exposure: Not on file   Alcohol Use: Not At Risk    Frequency of Alcohol Consumption: Never    Average Number of Drinks: Patient does not drink    Frequency of Binge Drinking: Never   Emergency planning/management officer Strain:  Not on file   Food Insecurity: Not on file   Transportation Needs: Not on file   Physical Activity: Not on file   Stress: Not on file   Social Connections: Not on file   Intimate Partner Violence: Not on file   Depression: Not on file   Housing Stability: Not on file   Postpartum Depression: Not on file       PHYSICAL EXAM   Physical Exam  Constitutional:       Appearance: Normal appearance. She is normal weight.   HENT:      Head: Normocephalic and atraumatic.      Nose: Nose normal. No congestion.      Mouth/Throat:      Mouth: Mucous membranes are moist.      Pharynx: Oropharynx is clear.   Eyes:      Extraocular Movements: Extraocular movements intact.      Conjunctiva/sclera: Conjunctivae normal.   Cardiovascular:      Rate and Rhythm: Normal rate and regular rhythm.      Pulses: Normal pulses.      Heart sounds: Normal heart sounds.   Pulmonary:      Effort: Pulmonary effort is normal. No respiratory distress.      Breath sounds: Normal breath sounds.   Abdominal:      General: Abdomen is flat.      Tenderness: There is abdominal  tenderness in the suprapubic area. There is no guarding or rebound.   Musculoskeletal:         General: No swelling or tenderness. Normal range of motion.      Cervical back: Normal range of motion and neck supple.   Skin:     General: Skin is warm.   Neurological:      General: No focal deficit present.      Mental Status: She is alert.      Cranial Nerves: No cranial nerve deficit.         SCREENINGS              LAB, EKG AND DIAGNOSTIC RESULTS   Labs:  Recent Results (from the past 12 hour(s))   CBC with Auto Differential    Collection Time: 01/29/22  5:15 PM   Result Value Ref Range    WBC 4.5 3.6 - 11.0 K/uL    RBC 4.36 3.80 - 5.20 M/uL    Hemoglobin 11.1 (L) 11.5 - 16.0 g/dL    Hematocrit 32.7 (L) 35.0 - 47.0 %    MCV 75.0 (L) 80.0 - 99.0 FL    MCH 25.5 (L) 26.0 - 34.0 PG    MCHC 33.9 30.0 - 36.5 g/dL    RDW 18.2 (H) 11.5 - 14.5 %    Platelets 371 150 - 400 K/uL    MPV 9.2 8.9 - 12.9 FL    Nucleated RBCs 0.0 0.0 PER 100 WBC    nRBC 0.00 0.00 - 0.01 K/uL    Neutrophils % 50 32 - 75 %    Lymphocytes % 39 12 - 49 %    Monocytes % 9 5 - 13 %    Eosinophils % 2 0 - 7 %    Basophils % 0 0 - 1 %    Immature Granulocytes 0 0 - 0.5 %    Neutrophils Absolute 2.2 1.8 - 8.0 K/UL    Lymphocytes Absolute 1.7 0.8 - 3.5 K/UL    Monocytes Absolute 0.4 0.0 - 1.0 K/UL    Eosinophils Absolute  0.1 0.0 - 0.4 K/UL    Basophils Absolute 0.0 0.0 - 0.1 K/UL    Absolute Immature Granulocyte 0.0 0.00 - 0.04 K/UL    Differential Type AUTOMATED     CMP    Collection Time: 01/29/22  5:15 PM   Result Value Ref Range    Sodium 138 136 - 145 mmol/L    Potassium 3.8 3.5 - 5.1 mmol/L    Chloride 109 (H) 97 - 108 mmol/L    CO2 23 21 - 32 mmol/L    Anion Gap 6 5 - 15 mmol/L    Glucose 101 (H) 65 - 100 mg/dL    BUN 11 6 - 20 mg/dL    Creatinine 0.83 0.55 - 1.02 mg/dL    Bun/Cre Ratio 13 12 - 20      Est, Glom Filt Rate >60 >60 ml/min/1.61m    Calcium 8.9 8.5 - 10.1 mg/dL    Total Bilirubin 0.1 (L) 0.2 - 1.0 mg/dL    AST 14 (L) 15 - 37 U/L    ALT  18 12 - 78 U/L    Alk Phosphatase 55 45 - 117 U/L    Total Protein 7.4 6.4 - 8.2 g/dL    Albumin 3.3 (L) 3.5 - 5.0 g/dL    Globulin 4.1 (H) 2.0 - 4.0 g/dL    Albumin/Globulin Ratio 0.8 (L) 1.1 - 2.2     TYPE AND SCREEN    Collection Time: 01/29/22  6:42 PM   Result Value Ref Range    Crossmatch expiration date 02/01/2022,2359     ABO/Rh O Positive     Antibody Screen Negative    Protime-INR    Collection Time: 01/29/22  6:42 PM   Result Value Ref Range    Protime 11.8 (L) 11.9 - 14.6 sec    INR 0.8 (L) 0.9 - 1.1         EKG: Not Applicable    Radiologic Studies:  Non-plain film images such as CT, Ultrasound and MRI are read by the radiologist. Plain radiographic images are visualized and preliminarily interpreted by the ED Physician with the following findings: Not Applicable.    Interpretation per the Radiologist below, if available at the time of this note:  No orders to display        ED COURSE and DIFFERENTIAL DIAGNOSIS/MDM   CC/HPI Summary, DDx, ED Course, and Reassessment: 41year old female no significant past medical history presents emergency department for abdominal pain vaginal bleeding, was diagnosed with intrauterine fetal demise 5 days ago, started on mifepristone yesterday, complains of vaginal bleeding and cramping after taking medication.    Physical shows well-appearing female no distress, abdomen soft mild suprapubic tenderness palpation no rebound or guarding.    We will draw basic lab work including CBC, administer morphine, LR, contact OB/GYN for further recommendations.    Records Reviewed (source and summary of external notes): Prior medical records and Nursing notes    Vitals:    Vitals:    01/29/22 1645   BP: 106/83   Pulse: 92   Resp: 20   Temp: 99.1 F (37.3 C)   TempSrc: Oral   SpO2: 97%   Weight: 74.8 kg (165 lb)   Height: 1.753 m ('5\' 9"' )        ED COURSE  ED Course as of 01/29/22 2310   Sat Jan 29, 2022   2032 I discussed case with Dr. WVerl Blalock on-call OB/GYN, states that pain bleeding  is common after miscarriage, recommends addition  of Cytotec to patient's regiment, 400 mcg 4 times a day x3 days to start tomorrow.  At this time given stable vitals patient not necessarily needs admission.  To follow-up with primary OB/GYN within the next week, instructed to return to emergency department for any worsening abdominal pain nausea vomiting, instructed patient that to expect some vaginal bleeding, if she soaks more than 1 pad an hour for more than 8 hours to return to emergency department.  Patient amenable with plan, will discharge home with analgesia and Cytotec. [PZ]      ED Course User Index  [PZ] Sanda Klein, MD       Disposition Considerations (Tests not done, Shared Decision Making, Pt Expectation of Test or Treatment.): Not Applicable    Patient was given the following medications:  Medications   lactated ringers bolus bolus 1,000 mL (0 mLs IntraVENous Stopped 01/29/22 2151)   morphine (PF) injection 4 mg (4 mg IntraVENous Given 01/29/22 1917)       CONSULTS: (Who and What was discussed)  None     Social Determinants affecting Dx or Tx: None    Smoking Cessation: Not Applicable    PROCEDURES   Unless otherwise noted above, none.  Procedures      CRITICAL CARE TIME   Patient does not meet Critical Care Time, 0 minutes    FINAL IMPRESSION     1. Incomplete miscarriage          DISPOSITION/PLAN   DISPOSITION Decision To Discharge 01/29/2022 09:06:14 PM    Discharge Note: The patient is stable for discharge home. The signs, symptoms, diagnosis, and discharge instructions have been discussed, understanding conveyed, and agreed upon. The patient is to follow up as recommended or return to ER should their symptoms worsen.      PATIENT REFERRED TO:  Biagio Quint, MD  [place] [address]  [phone]      As needed for OB/GYN follow up    [facility] EMERGENCY DEPT  [place]  [phone]    If symptoms worsen        DISCHARGE MEDICATIONS:      Medication List        START taking these medications      miSOPROStol 100 MCG tablet  Commonly known as: Cytotec  Take 4 tablets by mouth 4 times daily for 3 days     oxyCODONE-acetaminophen 5-325 MG per tablet  Commonly known as: Percocet  Take 1 tablet by mouth every 6 hours as needed for Pain for up to 3 days. Intended supply: 3 days. Take lowest dose possible to manage pain Max Daily Amount: 4 tablets            ASK your doctor about these medications      docusate sodium 100 MG capsule  Commonly known as: Colace  Take 1 capsule by mouth 2 times daily     * ondansetron 4 MG disintegrating tablet  Commonly known as: ZOFRAN-ODT  Place 1 tablet under the tongue every 8 hours as needed for Nausea or Vomiting     * ondansetron 4 MG disintegrating tablet  Commonly known as: ZOFRAN-ODT  Place 1 tablet under the tongue every 8 hours as needed for Nausea or Vomiting     Prenatal Vitamin 27-0.8 MG Tabs  Take 1 tablet by mouth daily           * This list has 2 medication(s) that are the  same as other medications prescribed for you. Read the directions carefully, and ask your doctor or other care provider to review them with you.                   Where to Get Your Medications        These medications were sent to [id number]- [place], [place] [address] [id number]     Phone: [phone]  miSOPROStol 100 MCG tablet  oxyCODONE-acetaminophen 5-325 MG per tablet           DISCONTINUED MEDICATIONS:  Discharge Medication List as of 01/29/2022  9:44 PM          I am the Primary Clinician of Record: PEarnestine Mealing MD (electronically signed)    (Please note that parts of this dictation were completed with voice recognition software. Quite often unanticipated grammatical, syntax, homophones, and other interpretive errors are inadvertently transcribed by the computer software. Please disregards these errors. Please excuse any errors that have escaped  final proofreading.)     PSanda Klein MD  01/29/22 2311

## 2022-01-30 LAB — TYPE AND SCREEN
ABO/Rh: O POS
Antibody Screen: NEGATIVE

## 2022-01-30 MED ORDER — OXYCODONE-ACETAMINOPHEN 5-325 MG PO TABS
5-325 MG | ORAL_TABLET | Freq: Four times a day (QID) | ORAL | 0 refills | Status: AC | PRN
Start: 2022-01-30 — End: 2022-02-01

## 2022-01-30 MED ORDER — MISOPROSTOL 100 MCG PO TABS
100 | ORAL_TABLET | Freq: Four times a day (QID) | ORAL | 0 refills | Status: DC
Start: 2022-01-30 — End: 2024-04-10

## 2022-02-03 NOTE — Progress Notes (Addendum)
This patient was received in signout, please refer to initial ED provider note for full history of present illness, review of systems, physical exam and medical decision making.    ED Course as of 02/07/22 2158   Thu Feb 03, 2022   2006 Provider in Triage Note    Patient Identification  Patient information was obtained from patient.  History/Exam limitations: none.    HPI:   41 year old female with hx of asthma, G7A6, approximately 10-11 weeks, reports vaginal bleeding x 2 days, states she feels like she is passing tissue. Had syncopal episode in triage. Sending back for acute bed. Vitals stable.    Physical Exam:  General:    Additional PE Information: appears dizzy, vitals are stable, has abdominal tenderness on exam    MDM/Visit Events:  Diagnostics:  Laboratory tests ordered and pending results  Radiology tests ordered and pending results  Lab Results: pending  Notes:  labs ordered    Additional Notes:  - Patient has been advised that this is a rapid assessment to initiate their evaluation rather than definitive emergency medical care  - Further evaluation and necessary treatment will be assumed in a main treatment area by another provider unless this provider feels the patient may be safely discharged or otherwise dispositioned from triage.   [AA]   2104 HCG, Serum(!): 237 [JM]   2128 Attending care endorsed to Dr Logan Bores pending OB evaluation  [JM]   2128 Rh: Positive [JM]   2200 Hemoglobin(!): 10.8  Not significantly changed from two weeks ago. [KM]   2246 Lactate: 1.7 [KM]   2327 Cleared by OB. Patient feeling improved after Toradol. Will PO challenge and road test. [KM]   2330 Patient signed out to overnight resident pending PO challenge and road test. [KM]   2349 Patient passed p.o. challenge.  However on attempt at road test, nursing notes that patient was unstable on her feet.  They sat her up and her blood pressure and heart rate remained stable, however she flopped back down to the bed and said that she  was too weak to walk.  We will continue to encourage p.o. intake.  We will give patient 1 L IV fluids at this time.  Will reevaluate following IV fluids and determine ultimate disposition. [MM]   Fri Feb 04, 2022   0135 Reevaluated patient, she is still excessively sleepy, easily arousable, however when asked to sit up she said that her head hurt and she did not want to stand up farther because she felt lightheaded.  Laid back down and fell back to sleep.  Have not yet obtained urine, will straight cath patient to obtain urine and evaluate for underlying infection.  Will give Protonix and Tylenol right now for headache.  Fluid bolus completed, vitals stable, abdomen soft.   [MM]   0343 Hemoglobin(!): 9.1 [MM]   0343 Cocaine Screen, Urine(!): Presumptive Positive [MM]   0344 UA without evidence of UTI or hematuria. Only put out 350 mL of fluids, so not retaining.  [MM]   0426 CT A/P without acute abnormalities. Will road test one more time. Has gotten 1 L IV fluids, has tolerated PO throughout night. Symptoms related to withdrawal from recreational substances vs. Acute blood loss. If unable to pass road test will contact CDU for observation and repeat Hgb in the AM.  [MM]   0544 CDU full. Spoke with MAA deferred to gyn. Messaged gyn team who will come to re-evaluate the patient.  [MM]  1610 Gyn came to eval the patient.  Reexamined patient and noted that she has had no vaginal bleeding throughout the night.  Do not believe that this her symptoms are related to her vaginal bleeding.  Anemia may be dilutional with white blood cell count and platelet count also decreased from comparison CBC.  Patient does not have any evidence of infection that would be relating to her symptoms.  Will defer to medicine for observation with possible withdrawal versus other causes of anemia. [MM]   [id number] Spoke with [facility] who stated they would evaluate the patient but she currently does meet criteria for admission. Informed MAA that patient  is unsafe to disposition and requires observation until she is able to ambulate. MAA requested PT consult, order placed.  [MM]   1207 PT has evaluated the patient.  They do not believe she safe for discharge but also feel that overall her effort to participate was very poor.  Patient says she wants to be admitted to the hospital.  I am not able to discharge her in her current condition.  I spoke to the MA about admission.  The MAA is now speaking to CDU since they had a bed opened up. [JB]   1330 CDU has evaluated the patient and they are concerned the patient is exhibiting depressive symptoms.  She tells me she was admitted for 8 days last month at an [facility] facility after suicide attempt.  She endorses depressive symptoms currently but denies suicidal ideation.  Will speak to psych.  I think patient is at high risk of suicide although she does not currently endorse ideation.  She has multiple previous attempts including a very recent 1.  She has been diagnosed with a miscarriage.  She has depressive symptoms and also significant social stressors in the form of taking care of 6 other children at home.  Psychiatry has agreed to evaluate. [JB]   1509 Psychiatry will admit after COVID obtained.  COVID swab is ordered. [JB]   1931 Pt is refusing to be admitted to Psych claiming she was not informed; Psych was informed and are coming to speak again with her (she had agreed to admission on their evaluation) [FC]      ED Course User Index  [AA] Pennie Rushing, MD  [FC] Linus Orn, MD  [JB] Bobbye Riggs, MD  [JM] Reginold Agent, MD  [KM] Claud Kelp, MD  [MM] Theresia Lo, MD       Theresia Lo, MD   Emergency Medicine PGY1  [facility]  Pager [phone]

## 2023-01-18 ENCOUNTER — Emergency Department: Admit: 2023-01-18 | Payer: MEDICARE

## 2023-01-18 ENCOUNTER — Inpatient Hospital Stay
Admit: 2023-01-18 | Discharge: 2023-01-19 | Disposition: A | Discharge status: Home / Self Care | Payer: MEDICARE | Attending: Emergency Medicine

## 2023-01-18 DIAGNOSIS — R197 Diarrhea, unspecified: Secondary | ICD-10-CM

## 2023-01-18 DIAGNOSIS — R112 Nausea with vomiting, unspecified: Secondary | ICD-10-CM

## 2023-01-18 LAB — CBC WITH AUTO DIFFERENTIAL
Basophils %: 1 % (ref 0–1)
Basophils Absolute: 0 10*3/uL (ref 0.0–0.1)
Eosinophils %: 1 % (ref 0–7)
Eosinophils Absolute: 0 10*3/uL (ref 0.0–0.4)
Hematocrit: 28.8 % — ABNORMAL LOW (ref 35.0–47.0)
Hemoglobin: 10.1 g/dL — ABNORMAL LOW (ref 11.5–16.0)
Immature Granulocytes %: 0 % (ref 0–0.5)
Immature Granulocytes Absolute: 0 10*3/uL (ref 0.00–0.04)
Lymphocytes %: 42 % (ref 12–49)
Lymphocytes Absolute: 1.7 10*3/uL (ref 0.8–3.5)
MCH: 25.8 pg — ABNORMAL LOW (ref 26.0–34.0)
MCHC: 35.1 g/dL (ref 30.0–36.5)
MCV: 73.7 FL — ABNORMAL LOW (ref 80.0–99.0)
MPV: 8.6 FL — ABNORMAL LOW (ref 8.9–12.9)
Monocytes %: 12 % (ref 5–13)
Monocytes Absolute: 0.5 10*3/uL (ref 0.0–1.0)
Neutrophils %: 44 % (ref 32–75)
Neutrophils Absolute: 1.8 10*3/uL (ref 1.8–8.0)
Nucleated RBCs: 0 /100{WBCs}
Platelets: 390 10*3/uL (ref 150–400)
RBC: 3.91 M/uL (ref 3.80–5.20)
RDW: 16.3 % — ABNORMAL HIGH (ref 11.5–14.5)
WBC: 4 10*3/uL (ref 3.6–11.0)
nRBC: 0 10*3/uL (ref 0.00–0.01)

## 2023-01-18 LAB — COVID-19 & INFLUENZA COMBO
Rapid Influenza A By PCR: NOT DETECTED
Rapid Influenza B By PCR: NOT DETECTED
SARS-CoV-2, PCR: NOT DETECTED

## 2023-01-18 LAB — COMPREHENSIVE METABOLIC PANEL
ALT: 16 U/L (ref 12–78)
AST: 11 U/L — ABNORMAL LOW (ref 15–37)
Albumin/Globulin Ratio: 0.9 — ABNORMAL LOW (ref 1.1–2.2)
Albumin: 3.2 g/dL — ABNORMAL LOW (ref 3.5–5.0)
Alk Phosphatase: 60 U/L (ref 45–117)
Anion Gap: 6 mmol/L (ref 5–15)
BUN/Creatinine Ratio: 14 (ref 12–20)
BUN: 13 mg/dL (ref 6–20)
CO2: 24 mmol/L (ref 21–32)
Calcium: 8.6 mg/dL (ref 8.5–10.1)
Chloride: 110 mmol/L — ABNORMAL HIGH (ref 97–108)
Creatinine: 0.95 mg/dL (ref 0.55–1.02)
Est, Glom Filt Rate: 77 mL/min/{1.73_m2} (ref >60)
Globulin: 3.6 g/dL (ref 2.0–4.0)
Glucose: 91 mg/dL (ref 65–100)
Potassium: 4.5 mmol/L (ref 3.5–5.1)
Sodium: 140 mmol/L (ref 136–145)
Total Bilirubin: 0.4 mg/dL (ref 0.2–1.0)
Total Protein: 6.8 g/dL (ref 6.4–8.2)

## 2023-01-18 LAB — TROPONIN: Troponin, High Sensitivity: <4 ng/L (ref 0–51)

## 2023-01-18 LAB — LIPASE: Lipase: 21 U/L (ref 13–75)

## 2023-01-18 MED ORDER — ACETAMINOPHEN 500 MG PO TABS
500 | ORAL | Status: AC
Start: 2023-01-18 — End: 2023-01-18
  Administered 2023-01-19: 01:00:00 1000 mg via ORAL

## 2023-01-18 MED ORDER — SODIUM CHLORIDE 0.9 % IV BOLUS
0.9 | Freq: Once | INTRAVENOUS | Status: AC
Start: 2023-01-18 — End: 2023-01-18
  Administered 2023-01-18: 21:00:00 1000 mL via INTRAVENOUS

## 2023-01-18 MED ORDER — SODIUM CHLORIDE (PF) 0.9 % IJ SOLN
0.9 | INTRAMUSCULAR | Status: AC
Start: 2023-01-18 — End: 2023-01-18
  Administered 2023-01-18: 22:00:00 20 mg via INTRAVENOUS

## 2023-01-18 MED ORDER — GI COCKTAIL
Freq: Once | Status: AC
Start: 2023-01-18 — End: 2023-01-18
  Administered 2023-01-19: 01:00:00 30 mL via ORAL

## 2023-01-18 MED ORDER — KETOROLAC TROMETHAMINE 15 MG/ML IJ SOLN
15 | Freq: Once | INTRAMUSCULAR | Status: AC
Start: 2023-01-18 — End: 2023-01-18
  Administered 2023-01-18: 22:00:00 15 mg via INTRAVENOUS

## 2023-01-18 MED ORDER — ONDANSETRON HCL 4 MG/2ML IJ SOLN
4 | Freq: Once | INTRAMUSCULAR | Status: AC
Start: 2023-01-18 — End: 2023-01-18
  Administered 2023-01-18: 22:00:00 4 mg via INTRAVENOUS

## 2023-01-18 MED FILL — FAMOTIDINE (PF) 20 MG/2ML IV SOLN: 20 MG/2ML | INTRAVENOUS | Qty: 2

## 2023-01-18 MED FILL — SODIUM CHLORIDE 0.9 % IV SOLN: 0.9 % | INTRAVENOUS | Qty: 1000

## 2023-01-18 MED FILL — KETOROLAC TROMETHAMINE 15 MG/ML IJ SOLN: 15 MG/ML | INTRAMUSCULAR | Qty: 1

## 2023-01-18 MED FILL — ONDANSETRON HCL 4 MG/2ML IJ SOLN: 4 MG/2ML | INTRAMUSCULAR | Qty: 2

## 2023-01-18 MED FILL — GI COCKTAIL: Qty: 30

## 2023-01-18 NOTE — Discharge Instructions (Addendum)
Thank you for choosing our Emergency Department for your care.  It is our privilege to care for you in your time of need.  In the next several days, you may receive a survey via email or mailed to your home about your experience with our team.  We would greatly appreciate you taking a few minutes to complete the survey, as we use this information to learn what we have done well and what we could be doing better. Thank you for trusting [place] with your care!    Below you will find a list of your tests from today's visit.   Labs  Recent Results (from the past 12 hour(s))   CBC with Auto Differential    Collection Time: 01/18/23  5:03 PM   Result Value Ref Range    WBC 4.0 3.6 - 11.0 K/uL    RBC 3.91 3.80 - 5.20 M/uL    Hemoglobin 10.1 (L) 11.5 - 16.0 g/dL    Hematocrit 16.1 (L) 35.0 - 47.0 %    MCV 73.7 (L) 80.0 - 99.0 FL    MCH 25.8 (L) 26.0 - 34.0 PG    MCHC 35.1 30.0 - 36.5 g/dL    RDW 09.6 (H) 04.5 - 14.5 %    Platelets 390 150 - 400 K/uL    MPV 8.6 (L) 8.9 - 12.9 FL    Nucleated RBCs 0.0 0.0 PER 100 WBC    nRBC 0.00 0.00 - 0.01 K/uL    Neutrophils % 44 32 - 75 %    Lymphocytes % 42 12 - 49 %    Monocytes % 12 5 - 13 %    Eosinophils % 1 0 - 7 %    Basophils % 1 0 - 1 %    Immature Granulocytes % 0 0 - 0.5 %    Neutrophils Absolute 1.8 1.8 - 8.0 K/UL    Lymphocytes Absolute 1.7 0.8 - 3.5 K/UL    Monocytes Absolute 0.5 0.0 - 1.0 K/UL    Eosinophils Absolute 0.0 0.0 - 0.4 K/UL    Basophils Absolute 0.0 0.0 - 0.1 K/UL    Immature Granulocytes Absolute 0.0 0.00 - 0.04 K/UL    Differential Type AUTOMATED     Comprehensive Metabolic Panel    Collection Time: 01/18/23  5:03 PM   Result Value Ref Range    Sodium 140 136 - 145 mmol/L    Potassium 4.5 3.5 - 5.1 mmol/L    Chloride 110 (H) 97 - 108 mmol/L    CO2 24 21 - 32 mmol/L    Anion Gap 6 5 - 15 mmol/L    Glucose 91 65 - 100 mg/dL    BUN 13 6 - 20 mg/dL    Creatinine 4.09 8.11 - 1.02 mg/dL    BUN/Creatinine Ratio 14 12 - 20      Est, Glom Filt Rate 77 >60 ml/min/1.41m2     Calcium 8.6 8.5 - 10.1 mg/dL    Total Bilirubin 0.4 0.2 - 1.0 mg/dL    AST 11 (L) 15 - 37 U/L    ALT 16 12 - 78 U/L    Alk Phosphatase 60 45 - 117 U/L    Total Protein 6.8 6.4 - 8.2 g/dL    Albumin 3.2 (L) 3.5 - 5.0 g/dL    Globulin 3.6 2.0 - 4.0 g/dL    Albumin/Globulin Ratio 0.9 (L) 1.1 - 2.2     COVID-19 & Influenza Combo    Collection Time: 01/18/23  5:03  PM    Specimen: Nasopharyngeal   Result Value Ref Range    SARS-CoV-2, PCR Not Detected Not Detected      Rapid Influenza A By PCR Not Detected Not Detected      Rapid Influenza B By PCR Not Detected Not Detected     Troponin    Collection Time: 01/18/23  5:03 PM   Result Value Ref Range    Troponin, High Sensitivity <4 0 - 51 ng/L   Lipase    Collection Time: 01/18/23  5:03 PM   Result Value Ref Range    Lipase 21 13 - 75 U/L   Urinalysis with Reflex to Culture    Collection Time: 01/18/23  7:57 PM    Specimen: Urine   Result Value Ref Range    Color, UA Yellow/Straw      Appearance Turbid (A) Clear      Specific Gravity, UA 1.021 1.003 - 1.030      pH, Urine 5.0 5.0 - 8.0      Protein, UA Negative Negative mg/dL    Glucose, Ur Negative Negative mg/dL    Ketones, Urine Negative Negative mg/dL    Bilirubin, Urine Negative Negative      Blood, Urine Negative Negative      Urobilinogen, Urine 0.1 0.1 - 1.0 EU/dL    Nitrite, Urine Negative Negative      Leukocyte Esterase, Urine Negative Negative      WBC, UA 0-4 0 - 4 /hpf    RBC, UA 0-5 0 - 5 /hpf    Epithelial Cells, UA Many (A) Few /lpf    BACTERIA, URINE Negative Negative /hpf    Urine Culture if Indicated Culture not indicated by UA result Culture not indicated by UA result      Mucus, UA Trace (A) Negative /lpf   POC Pregnancy Urine Qual    Collection Time: 01/18/23  7:58 PM   Result Value Ref Range    HCG, Urine, POC Negative Negative    Lot Number [id number]     Positive QC Pass/Fail Pass     Negative QC Pass/Fail Pass        Radiologic Studies  CT ABDOMEN PELVIS W IV CONTRAST Additional Contrast? None    Final Result      1. No acute abnormality in the abdomen or pelvis      2. 7 mm pleural-based nodule at the left base      Guidelines by the Fleischner society (Radiology 2017 special report) suggest   that patients with a low risk for lung cancer who have solid nodule(s) greater   than 6 mm and less than or equal to 8 mm in diameter should have follow up in   approximately 6 to 12 months and, if no change, consider at 18-24 months.  In   patients with a higher risk, such as smokers, follow-up is recommended in 6 to   12 months and, if no change, at 18-24 months.  Patients with a known malignancy   are at increased risk for metastasis and should receive a three month follow-up.            Electronically signed by Sherlene Shams        ------------------------------------------------------------------------------------------------------------  The evaluation and treatment you received in the Emergency Department were for an urgent problem. It is important that you follow-up with a doctor, nurse practitioner, or physician assistant to:  (1) confirm your diagnosis,  (2) re-evaluation of changes in your illness  and treatment, and (3) for ongoing care. Please take your discharge instructions with you when you go to your follow-up appointment.     If you have any problem arranging a follow-up appointment, contact us!  If your symptoms become worse or you do not improve as expected, please return to [place]. We are available 24 hours a day.     If a prescription has been provided, please fill it as soon as possible to prevent a delay in treatment. If you have any questions or reservations about taking the medication due to side effects or interactions with other medications, please call your primary care provider or contact us directly.  Again, THANK YOU for choosing [place] to care for YOU!

## 2023-01-18 NOTE — ED Notes (Signed)
Bed side report given to Breanna RN.

## 2023-01-18 NOTE — ED Provider Notes (Signed)
[facility]  EMERGENCY DEPARTMENT HISTORY AND PHYSICAL EXAM      Date: 01/18/2023  Patient Name: Connie Morgan  MRN: [id number]  Birthdate [birth year]  Date of evaluation: 01/18/2023  Provider: Nicki Guadalajara, MD   Note Started: 5:33 PM EDT 01/18/23    HISTORY OF PRESENT ILLNESS     Chief Complaint   Patient presents with    Abdominal Pain    Emesis    Diarrhea       History Provided By: Patient    HPI: Connie Morgan is a 42 y.o. female past medical history of asthma presents for evaluation of vomiting and diarrhea.  Started today.  Also having associated periumbilical abdominal pain as well as headache.  No fever.  No history of abdominal surgeries.  Otherwise in her normal state of health.  No sick contacts.      PAST MEDICAL HISTORY   Past Medical History:  Past Medical History:   Diagnosis Date    Asthma        Past Surgical History:  No past surgical history on file.    Family History:  Family History   Problem Relation Age of Onset    No Known Problems Mother     No Known Problems Father        Social History:  Social History     Tobacco Use    Smoking status: Former     Current packs/day: 0.25     Types: Cigarettes    Smokeless tobacco: Never    Tobacco comments:     Quit smoking: 1-2 cigarettes a day   Vaping Use    Vaping status: Never Used   Substance Use Topics    Alcohol use: No    Drug use: No       Allergies:  No Known Allergies    PCP: None, None    Current Meds:   No current facility-administered medications for this encounter.     Current Outpatient Medications   Medication Sig Dispense Refill    ondansetron (ZOFRAN-ODT) 4 MG disintegrating tablet Place 1 tablet under the tongue every 8 hours as needed for Nausea or Vomiting 20 tablet 0    miSOPROStol (CYTOTEC) 100 MCG tablet Take 4 tablets by mouth 4 times daily for 3 days 48 tablet 0    Prenatal Vit-Fe Fumarate-FA (PRENATAL VITAMIN) 27-0.8 MG TABS Take 1 tablet by mouth daily 30 tablet 0    docusate sodium (COLACE) 100 MG capsule Take 1 capsule by  mouth 2 times daily 30 capsule 0       Social Determinants of Health:   Social Determinants of Health     Tobacco Use: Medium Risk (09/18/2022)    Patient History     Smoking Tobacco Use: Former     Smokeless Tobacco Use: Never     Passive Exposure: Not on file   Alcohol Use: Not At Risk (01/18/2023)    AUDIT-C     Frequency of Alcohol Consumption: Never     Average Number of Drinks: Patient does not drink     Frequency of Binge Drinking: Never   Physicist, medical Strain: Not on file   Food Insecurity: Not on file   Transportation Needs: Not on file   Physical Activity: Not on file   Stress: Not on file   Social Connections: Not on file   Intimate Partner Violence: Not on file   Depression: Not on file   Housing Stability: Not on file  Interpersonal Safety: Not At Risk (01/18/2023)    Interpersonal Safety Domain Source: IP Abuse Screening     Physical abuse: Denies     Verbal abuse: Denies     Emotional abuse: Denies     Financial abuse: Denies     Sexual abuse: Denies   Utilities: Not on file       PHYSICAL EXAM   Physical Exam  Vitals and nursing note reviewed.   Constitutional:       General: She is in acute distress.      Appearance: Normal appearance. She is normal weight. She is not toxic-appearing.   HENT:      Head: Normocephalic and atraumatic.      Nose: Nose normal.      Mouth/Throat:      Mouth: Mucous membranes are moist.   Eyes:      Conjunctiva/sclera: Conjunctivae normal.   Cardiovascular:      Rate and Rhythm: Normal rate.      Pulses: Normal pulses.   Pulmonary:      Effort: Pulmonary effort is normal. No respiratory distress.   Abdominal:      Tenderness: There is abdominal tenderness in the periumbilical area. Negative signs include Murphy's sign, Rovsing's sign and McBurney's sign.   Musculoskeletal:         General: No swelling or deformity. Normal range of motion.   Neurological:      General: No focal deficit present.      Mental Status: She is alert.   Psychiatric:         Mood and Affect:  Mood normal.         Behavior: Behavior normal.           SCREENINGS                No data recorded    LAB, EKG AND DIAGNOSTIC RESULTS   Labs:  Recent Results (from the past 12 hour(s))   CBC with Auto Differential    Collection Time: 01/18/23  5:03 PM   Result Value Ref Range    WBC 4.0 3.6 - 11.0 K/uL    RBC 3.91 3.80 - 5.20 M/uL    Hemoglobin 10.1 (L) 11.5 - 16.0 g/dL    Hematocrit 81.1 (L) 35.0 - 47.0 %    MCV 73.7 (L) 80.0 - 99.0 FL    MCH 25.8 (L) 26.0 - 34.0 PG    MCHC 35.1 30.0 - 36.5 g/dL    RDW 91.4 (H) 78.2 - 14.5 %    Platelets 390 150 - 400 K/uL    MPV 8.6 (L) 8.9 - 12.9 FL    Nucleated RBCs 0.0 0.0 PER 100 WBC    nRBC 0.00 0.00 - 0.01 K/uL    Neutrophils % 44 32 - 75 %    Lymphocytes % 42 12 - 49 %    Monocytes % 12 5 - 13 %    Eosinophils % 1 0 - 7 %    Basophils % 1 0 - 1 %    Immature Granulocytes % 0 0 - 0.5 %    Neutrophils Absolute 1.8 1.8 - 8.0 K/UL    Lymphocytes Absolute 1.7 0.8 - 3.5 K/UL    Monocytes Absolute 0.5 0.0 - 1.0 K/UL    Eosinophils Absolute 0.0 0.0 - 0.4 K/UL    Basophils Absolute 0.0 0.0 - 0.1 K/UL    Immature Granulocytes Absolute 0.0 0.00 - 0.04 K/UL    Differential Type AUTOMATED  Comprehensive Metabolic Panel    Collection Time: 01/18/23  5:03 PM   Result Value Ref Range    Sodium 140 136 - 145 mmol/L    Potassium 4.5 3.5 - 5.1 mmol/L    Chloride 110 (H) 97 - 108 mmol/L    CO2 24 21 - 32 mmol/L    Anion Gap 6 5 - 15 mmol/L    Glucose 91 65 - 100 mg/dL    BUN 13 6 - 20 mg/dL    Creatinine 9.52 8.41 - 1.02 mg/dL    BUN/Creatinine Ratio 14 12 - 20      Est, Glom Filt Rate 77 >60 ml/min/1.30m2    Calcium 8.6 8.5 - 10.1 mg/dL    Total Bilirubin 0.4 0.2 - 1.0 mg/dL    AST 11 (L) 15 - 37 U/L    ALT 16 12 - 78 U/L    Alk Phosphatase 60 45 - 117 U/L    Total Protein 6.8 6.4 - 8.2 g/dL    Albumin 3.2 (L) 3.5 - 5.0 g/dL    Globulin 3.6 2.0 - 4.0 g/dL    Albumin/Globulin Ratio 0.9 (L) 1.1 - 2.2     COVID-19 & Influenza Combo    Collection Time: 01/18/23  5:03 PM    Specimen:  Nasopharyngeal   Result Value Ref Range    SARS-CoV-2, PCR Not Detected Not Detected      Rapid Influenza A By PCR Not Detected Not Detected      Rapid Influenza B By PCR Not Detected Not Detected     Troponin    Collection Time: 01/18/23  5:03 PM   Result Value Ref Range    Troponin, High Sensitivity <4 0 - 51 ng/L   Lipase    Collection Time: 01/18/23  5:03 PM   Result Value Ref Range    Lipase 21 13 - 75 U/L   Urinalysis with Reflex to Culture    Collection Time: 01/18/23  7:57 PM    Specimen: Urine   Result Value Ref Range    Color, UA Yellow/Straw      Appearance Turbid (A) Clear      Specific Gravity, UA 1.021 1.003 - 1.030      pH, Urine 5.0 5.0 - 8.0      Protein, UA Negative Negative mg/dL    Glucose, Ur Negative Negative mg/dL    Ketones, Urine Negative Negative mg/dL    Bilirubin, Urine Negative Negative      Blood, Urine Negative Negative      Urobilinogen, Urine 0.1 0.1 - 1.0 EU/dL    Nitrite, Urine Negative Negative      Leukocyte Esterase, Urine Negative Negative      WBC, UA 0-4 0 - 4 /hpf    RBC, UA 0-5 0 - 5 /hpf    Epithelial Cells, UA Many (A) Few /lpf    BACTERIA, URINE Negative Negative /hpf    Urine Culture if Indicated Culture not indicated by UA result Culture not indicated by UA result      Mucus, UA Trace (A) Negative /lpf   POC Pregnancy Urine Qual    Collection Time: 01/18/23  7:58 PM   Result Value Ref Range    HCG, Urine, POC Negative Negative    Lot Number [id number]     Positive QC Pass/Fail Pass     Negative QC Pass/Fail Pass        EKG:.Not Applicable    Radiologic Studies:  Non-plain film images such  as CT, Ultrasound and MRI are read by the radiologist. Plain radiographic images are visualized and preliminarily interpreted by the ED Provider with the following findings: Not Applicable.    Interpretation per the Radiologist below, if available at the time of this note:  CT ABDOMEN PELVIS W IV CONTRAST Additional Contrast? None   Final Result      1. No acute abnormality in the abdomen  or pelvis      2. 7 mm pleural-based nodule at the left base      Guidelines by the Fleischner society (Radiology 2017 special report) suggest   that patients with a low risk for lung cancer who have solid nodule(s) greater   than 6 mm and less than or equal to 8 mm in diameter should have follow up in   approximately 6 to 12 months and, if no change, consider at 18-24 months.  In   patients with a higher risk, such as smokers, follow-up is recommended in 6 to   12 months and, if no change, at 18-24 months.  Patients with a known malignancy   are at increased risk for metastasis and should receive a three month follow-up.            Electronically signed by Sherlene Shams           ED COURSE and DIFFERENTIAL DIAGNOSIS/MDM   12:10 AM Differential and Considerations:     Records Reviewed (source and summary of external notes): Prior medical records and Nursing notes.    Vitals:    Vitals:    01/18/23 1826 01/18/23 1846 01/18/23 1856 01/18/23 1915   BP: 108/78 110/86 108/82 107/82   Pulse: 77 70 85 83   Resp:       Temp:       TempSrc:       SpO2: 98% 97% 96% 97%   Weight:       Height:            ED COURSE  ED Course as of 01/19/23 0011   Wed Jan 18, 2023   [id number] 42 year old female presents for evaluation of nausea vomiting headache and periumbilical abdominal pain.  Started today.  No history of abdominal surgeries.  No known sick contacts.  Differential diagnosis includes foodborne illness versus gastroenteritis versus other viral syndrome including COVID versus pancreatitis.  Getting labs and getting a CBC, CMP, troponin, lipase, UA. [LW]   Thu Jan 19, 2023   0011 Workup negative.  Patient feeling much better.  Tolerating p.o.  She does have an interval finding of a pulmonary nodule.  I discussed this finding with her and importance of follow-up with her doctor.  She is not a smoker.  She understands agrees with plan.  Discharged home [LW]      ED Course User Index  [LW] Nicki Guadalajara, MD       SEPSIS Reassessment:  Sepsis reassessment not applicable    Clinical Management Tools:  Not Applicable    Patient was given the following medications:  Medications   sodium chloride 0.9 % bolus 1,000 mL ( IntraVENous Stopped 01/18/23 1816)   ondansetron (ZOFRAN) injection 4 mg (4 mg IntraVENous Given 01/18/23 1748)   ketorolac (TORADOL) injection 15 mg (15 mg IntraVENous Given 01/18/23 1749)   famotidine (PEPCID) 20 mg in sodium chloride (PF) 0.9 % 10 mL injection (20 mg IntraVENous Given 01/18/23 1750)   acetaminophen (TYLENOL) tablet 1,000 mg (1,000 mg Oral Given 01/18/23 2042)   gi cocktail  30 mL (30 mLs Oral Given 01/18/23 2043)   iopamidol (ISOVUE-370) 76 % injection 100 mL (100 mLs IntraVENous Given 01/18/23 2222)       CONSULTS: See ED Course/MDM for further details.  None     Social Determinants affecting Diagnosis/Treatment: None    Smoking Cessation: Not Applicable    PROCEDURES   Unless otherwise noted above, none  Procedures      CRITICAL CARE TIME   Patient does not meet Critical Care Time, 0 minutes    ED IMPRESSION     1. Nausea vomiting and diarrhea    2. Pulmonary nodule          DISPOSITION/PLAN   DISPOSITION Decision To Discharge 01/18/2023 11:55:19 PM  Condition at Disposition: Stable    Discharge Note: The patient is stable for discharge home. The signs, symptoms, diagnosis, and discharge instructions have been discussed, understanding conveyed, and agreed upon. The patient is to follow up as recommended or return to ER should their symptoms worsen.      PATIENT REFERRED TO:  None, None          [facility]  [address] [place] [address]  [phone]    As needed        DISCHARGE MEDICATIONS:     Medication List        CONTINUE taking these medications      ondansetron 4 MG disintegrating tablet  Commonly known as: ZOFRAN-ODT  Place 1 tablet under the tongue every 8 hours as needed for Nausea or Vomiting            ASK your doctor about these medications      docusate sodium 100 MG  capsule  Commonly known as: Colace  Take 1 capsule by mouth 2 times daily     miSOPROStol 100 MCG tablet  Commonly known as: Cytotec  Take 4 tablets by mouth 4 times daily for 3 days     Prenatal Vitamin 27-0.8 MG Tabs  Take 1 tablet by mouth daily               Where to Get Your Medications        These medications were sent to [id number] [place], [place] - [address] - P [phone] - F [phone]  [address], [place] [address]      Phone: [phone]   ondansetron 4 MG disintegrating tablet           DISCONTINUED MEDICATIONS:  Current Discharge Medication List          I am the Primary Clinician of Record. Nicki Guadalajara, MD (electronically signed)    (Please note that parts of this dictation were completed with voice recognition software. Quite often unanticipated grammatical, syntax, homophones, and other interpretive errors are inadvertently transcribed by the computer software. Please disregards these errors. Please excuse any errors that have escaped final proofreading.)        Nicki Guadalajara, MD  01/19/23 [phone]

## 2023-01-18 NOTE — ED Triage Notes (Signed)
C/o abdominal pain, vomiting, and diarrhea since this AM.

## 2023-01-19 LAB — URINALYSIS WITH REFLEX TO CULTURE
BACTERIA, URINE: NEGATIVE /HPF
Bilirubin, Urine: NEGATIVE
Blood, Urine: NEGATIVE
Glucose, Ur: NEGATIVE mg/dL
Ketones, Urine: NEGATIVE mg/dL
Leukocyte Esterase, Urine: NEGATIVE
Nitrite, Urine: NEGATIVE
Protein, UA: NEGATIVE mg/dL
Specific Gravity, UA: 1.021 (ref 1.003–1.030)
Urobilinogen, Urine: 0.1 EU/dL (ref 0.1–1.0)
pH, Urine: 5 (ref 5.0–8.0)

## 2023-01-19 LAB — POC PREGNANCY UR-QUAL
HCG, Urine, POC: NEGATIVE
Lot Number: 718009

## 2023-01-19 MED ORDER — ONDANSETRON 4 MG PO TBDP
4 | ORAL_TABLET | Freq: Three times a day (TID) | ORAL | 0 refills | Status: DC | PRN
Start: 2023-01-19 — End: 2024-04-10

## 2023-01-19 MED ORDER — IOPAMIDOL 76 % IV SOLN
76 | Freq: Once | INTRAVENOUS | Status: AC | PRN
Start: 2023-01-19 — End: 2023-01-18
  Administered 2023-01-19: 02:00:00 100 mL via INTRAVENOUS

## 2023-01-19 MED FILL — ISOVUE-370 76 % IV SOLN: 76 % | INTRAVENOUS | Qty: 100

## 2023-01-19 MED FILL — ACETAMINOPHEN EXTRA STRENGTH 500 MG PO TABS: 500 MG | ORAL | Qty: 2

## 2023-02-02 NOTE — Telephone Encounter (Signed)
 [facility]  Lung Navigator Encounter    Name:    Karagan Lehr  Age:    42 y.o.  Diagnosis: incidental findings      Encounter type:  [] Patient Initiated  [] Navigator Follow-up [] Pre-op  [] Post-op  [] Check-in Prior to First Treatment [] Treatment Modality Change  [x] 1st point of Contact [] Referral [] Other:       Narrative:  Call to patient 8/27/028 to follow up on ER visit from 01/19/2023 and to discuss next steps for incidental findings on CT completed at that visit. NN introduced self and patient disconnected the call. NN made a second attempt to reach the patient immediately, and call was disconnected. NN reached out to Bishop Dike, NP at [facility] to advise of incidental findings on CT with hopes a familiar provider at [facility] could reach out to patient. A copy of DC Summary and CT result sent. Message received back from Bishop Dike, NP on 02/01/2023, that caller would not accept her call and relay of information was not successful. MyChart status is pending.     Arynn Armand [address] BSN, [employer] [employer].  Primary Lung Navigator   [facility]  [address] [place], [place] [address]  Cell: [phone]  F: [phone]  [email or web address]  Good Help to Those in Need

## 2023-02-09 ENCOUNTER — Inpatient Hospital Stay
Admission: EM | Admit: 2023-02-09 | Discharge: 2023-02-13 | Disposition: A | Discharge status: Home / Self Care | Payer: MEDICARE | Admitting: Psychiatry

## 2023-02-09 DIAGNOSIS — F259 Schizoaffective disorder, unspecified: Principal | ICD-10-CM

## 2023-02-09 DIAGNOSIS — F32A Depression, unspecified: Secondary | ICD-10-CM

## 2023-02-09 LAB — CBC WITH AUTO DIFFERENTIAL
Basophils %: 1 % (ref 0–1)
Basophils Absolute: 0 10*3/uL (ref 0.0–0.1)
Eosinophils %: 2 % (ref 0–7)
Eosinophils Absolute: 0.1 10*3/uL (ref 0.0–0.4)
Hematocrit: 31.1 % — ABNORMAL LOW (ref 35.0–47.0)
Hemoglobin: 10.8 g/dL — ABNORMAL LOW (ref 11.5–16.0)
Immature Granulocytes %: 0 % (ref 0–0.5)
Immature Granulocytes Absolute: 0 10*3/uL (ref 0.00–0.04)
Lymphocytes %: 27 % (ref 12–49)
Lymphocytes Absolute: 1.5 10*3/uL (ref 0.8–3.5)
MCH: 25.8 pg — ABNORMAL LOW (ref 26.0–34.0)
MCHC: 34.7 g/dL (ref 30.0–36.5)
MCV: 74.4 FL — ABNORMAL LOW (ref 80.0–99.0)
MPV: 8.7 FL — ABNORMAL LOW (ref 8.9–12.9)
Monocytes %: 10 % (ref 5–13)
Monocytes Absolute: 0.5 10*3/uL (ref 0.0–1.0)
Neutrophils %: 60 % (ref 32–75)
Neutrophils Absolute: 3.4 10*3/uL (ref 1.8–8.0)
Nucleated RBCs: 0 /100{WBCs}
Platelets: 422 10*3/uL — ABNORMAL HIGH (ref 150–400)
RBC: 4.18 M/uL (ref 3.80–5.20)
RDW: 15.8 % — ABNORMAL HIGH (ref 11.5–14.5)
WBC: 5.7 10*3/uL (ref 3.6–11.0)
nRBC: 0 10*3/uL (ref 0.00–0.01)

## 2023-02-09 LAB — BASIC METABOLIC PANEL
Anion Gap: 7 mmol/L (ref 2–12)
BUN/Creatinine Ratio: 19 (ref 12–20)
BUN: 15 mg/dL (ref 6–20)
CO2: 24 mmol/L (ref 21–32)
Calcium: 8.9 mg/dL (ref 8.5–10.1)
Chloride: 109 mmol/L — ABNORMAL HIGH (ref 97–108)
Creatinine: 0.79 mg/dL (ref 0.55–1.02)
Est, Glom Filt Rate: >90 mL/min/{1.73_m2} (ref >60)
Glucose: 94 mg/dL (ref 65–100)
Potassium: 4.4 mmol/L (ref 3.5–5.1)
Sodium: 140 mmol/L (ref 136–145)

## 2023-02-09 MED ORDER — SODIUM CHLORIDE 0.9 % IV BOLUS
0.9 | INTRAVENOUS | Status: AC
Start: 2023-02-09 — End: 2023-02-10

## 2023-02-09 MED ORDER — KETOROLAC TROMETHAMINE 15 MG/ML IJ SOLN
15 | INTRAMUSCULAR | Status: AC
Start: 2023-02-09 — End: 2023-02-09

## 2023-02-09 MED ADMIN — ketorolac (TORADOL) injection 15 mg: 15 mg | INTRAVENOUS | @ 23:00:00 | NDC 72266023401

## 2023-02-09 MED ADMIN — sodium chloride 0.9 % bolus 1,000 mL: 1000 mL | INTRAVENOUS | @ 23:00:00 | NDC 00338004904

## 2023-02-09 MED FILL — SODIUM CHLORIDE 0.9 % IV SOLN: 0.9 % | INTRAVENOUS | Qty: 1000

## 2023-02-09 MED FILL — KETOROLAC TROMETHAMINE 15 MG/ML IJ SOLN: 15 MG/ML | INTRAMUSCULAR | Qty: 1

## 2023-02-09 NOTE — Discharge Instructions (Signed)
 Thank you for choosing our Emergency Department for your care.  It is our privilege to care for you in your time of need.  In the next several days, you may receive a survey via email or mailed to your home about your experience with our team.  We would greatly appreciate you taking a few minutes to complete the survey, as we use this information to learn what we have done well and what we could be doing better. Thank you for trusting us  with your care!    Below you will find a list of your tests from today's visit.   Labs  Recent Results (from the past 12 hour(s))   CBC with Auto Differential    Collection Time: 02/09/23  7:03 PM   Result Value Ref Range    WBC 5.7 3.6 - 11.0 K/uL    RBC 4.18 3.80 - 5.20 M/uL    Hemoglobin 10.8 (L) 11.5 - 16.0 g/dL    Hematocrit 68.8 (L) 35.0 - 47.0 %    MCV 74.4 (L) 80.0 - 99.0 FL    MCH 25.8 (L) 26.0 - 34.0 PG    MCHC 34.7 30.0 - 36.5 g/dL    RDW 84.1 (H) 88.4 - 14.5 %    Platelets 422 (H) 150 - 400 K/uL    MPV 8.7 (L) 8.9 - 12.9 FL    Nucleated RBCs 0.0 0.0 PER 100 WBC    nRBC 0.00 0.00 - 0.01 K/uL    Neutrophils % 60 32 - 75 %    Lymphocytes % 27 12 - 49 %    Monocytes % 10 5 - 13 %    Eosinophils % 2 0 - 7 %    Basophils % 1 0 - 1 %    Immature Granulocytes % 0 0 - 0.5 %    Neutrophils Absolute 3.4 1.8 - 8.0 K/UL    Lymphocytes Absolute 1.5 0.8 - 3.5 K/UL    Monocytes Absolute 0.5 0.0 - 1.0 K/UL    Eosinophils Absolute 0.1 0.0 - 0.4 K/UL    Basophils Absolute 0.0 0.0 - 0.1 K/UL    Immature Granulocytes Absolute 0.0 0.00 - 0.04 K/UL    Differential Type AUTOMATED     Basic Metabolic Panel    Collection Time: 02/09/23  7:03 PM   Result Value Ref Range    Sodium 140 136 - 145 mmol/L    Potassium 4.4 3.5 - 5.1 mmol/L    Chloride 109 (H) 97 - 108 mmol/L    CO2 24 21 - 32 mmol/L    Anion Gap 7 2 - 12 mmol/L    Glucose 94 65 - 100 mg/dL    BUN 15 6 - 20 mg/dL    Creatinine 9.20 9.44 - 1.02 mg/dL    BUN/Creatinine Ratio 19 12 - 20      Est, Glom Filt Rate >90 >60 ml/min/1.80m2     Calcium 8.9 8.5 - 10.1 mg/dL       Radiologic Studies  No orders to display     ------------------------------------------------------------------------------------------------------------  The evaluation and treatment you received in the Emergency Department were for an urgent problem. It is important that you follow-up with a doctor, nurse practitioner, or physician assistant to:  (1) confirm your diagnosis,  (2) re-evaluation of changes in your illness and treatment, and (3) for ongoing care. Please take your discharge instructions with you when you go to your follow-up appointment.     If you have any problem arranging  a follow-up appointment, contact us !  If your symptoms become worse or you do not improve as expected, please return to us . We are available 24 hours a day.     If a prescription has been provided, please fill it as soon as possible to prevent a delay in treatment. If you have any questions or reservations about taking the medication due to side effects or interactions with other medications, please call your primary care provider or contact us  directly.  Again, THANK YOU for choosing us  to care for YOU!

## 2023-02-09 NOTE — ED Triage Notes (Signed)
 C/o bleeding from butt, possible hemorrhoids, feeling like food gets stuck when eating it, occasional shortness of breath, and lower abdominal pain. Pt also reports that she has other stuff going on, like mental stuff. Tearful in triage. Endorsing thoughts of wanting to go to sleep and not wake back up. Denies plan to harm self. Reports being depressed, beginning yesterday. Also reports that she wants to hurt somebody she deals with

## 2023-02-09 NOTE — BSMART Note (Signed)
 Comprehensive Assessment Form Part 1      Section I - Disposition    Primary Diagnosis: Schizoaffective disorder, bipolar type  Secondary Diagnosis:     The Medical Doctor to Psychiatrist conference was notcompleted.  The Medical Doctor is in agreement with intake disposition   The plan is voluntary admission pending medical clearance.  The on-call Psychiatrist consulted was Dr. N/A.  The admitting Psychiatrist will be Dr. NELLIE.  The admitting Diagnosis is none.  The Payor source is [employer].      BSMART assessment completed, and suicide risk level noted to be Low. Primary Nurse Maegan RN and Charge Nurse N/A and Physician Sequeira notified. Concerns not observed.     This clinical research associate reviewed the [place] Suicide Severity Rating Scale in nursing flowsheet and the risk level assigned is no risk.  Based on this assessment, the risk of suicide is low risk and the plan is inpatient admission pending medical clearance.  Section II - Integrated Summary  Summary:      This clinical research associate met with pt face to face in ED 23. Pt was not accompanied by anyone. Pt presented in a depressed/tearful mood with a flat affect. Pt made poor eye contact. Pt presented with blaming insight. Pt did not appear to be responding to internal stimuli. Pt endorsed SI without a plan and AH. Pt denied HI and AVH.    Pt states she has been feeling increasingly suicidal over the past two days. Pt expressed feelings of hopelessness and worthlessness. Pt endorsed symptoms of isolation, withdrawal, crying spells, low mood, poor sleep and appetite. Pt reports stressors within her relationship and adult children. Pt endorses that when she is not working, she spends all day in her room. Pt endorses passive SI but also reports that her AH may be command in nature. Stating  I'll just be cooking in the kitchen, and then next thing I know, I grab a knife. Pt reports she spends all night pacing and has not eaten much in the past four days.     Per pt,  she has a hx of schizophrenia, bipolar disorder, anxiety and depression. Pt sees [facility] for medication management and therapy. Pt reports she typically takes Zyprexa, celexa and hydroxyzine . Pt reports she stopped taking her medications approximately two weeks ago due to them making her too drowsy. She also reports she does not find her therapist helpful. Pt has a hx of asthma, and uses a rescue inhaler. Pt ambulates and completes ADLs without assistance.     Pt is currently meeting inpatient criteria. Pt is willing to stay for voluntary inpatient treatment at this time.       The patient has demonstrated mental capacity to provide informed consent.  The information is given by the patient and past medical records.  The Chief Complaint is depression.  The Precipitant Factors are medication noncompliance, poor coping tools.  Previous Hospitalizations: 2023 [facility]  The patient has not previously been in restraints.  Current Psychiatrist and/or Case Manager is [facility].    Lethality Assessment:    The potential for suicide noted by the following: noted by the following;  ideation.  The potential for homicide is not noted.  The patient has not been a perpetrator of sexual or physical abuse.  There are not pending charges.  The patient is  felt to be at risk for self harm or harm to others.  The attending nurse was advised to remove potentially harmful or dangerous items from  the patient's room , to remove patient clothing and place it out of immediate access to the patient, and the patient needs supervision.    Section III - Psychosocial  The patient's overall mood and attitude is depressed and tearful.  Feelings of helplessness and hopelessness are not observed.  Generalized anxiety is not observed.  Panic is not observed. Phobias are not observed.  Obsessive compulsive tendencies are not observed.      Section IV - Mental Status Exam  The patient's appearance shows no evidence of impairment.  The patient's behavior  shows poor eye contact. The patient is oriented to time, place, person and situation.  The patient's speech is soft.  The patient's mood is depressed.  The range of affect is flat.  The patient's thought content demonstrates no evidence of impairment .  The thought process is circumstantial.  The patient's perception shows no evidence of impairment. The patient's memory shows no evidence of impairment.  The patient's appetite is decreased and shows signs of weight loss .  The patient's sleep has evidence of insomnia. The patient's insight is blaming.  The patient's judgement is psychologically impaired.      Section V - Substance Abuse  The patient is  using substances.  The patient is using cannabis smoked for 5-10 years with last use on 02/09/2023 and cocaine  nasal for 5-10 years with last use on 02/08/2023. The patient has experienced the following withdrawal symptoms: N/A.    Section VI - Living Arrangements  The patient Single.  The patient lives with her adult children. The patient has adult children, and younger children in the foster care system.  The patient does plan to return home upon discharge.  The patient does not have legal issues pending. The patient's source of income comes from employment.  Religious and cultural practices have not been voiced at this time.    The patient's greatest support comes from her family and this person will not be involved with the treatment.    The patient has not been in an event described as horrible or outside the realm of ordinary life experience either currently or in the past.  The patient has not been a victim of sexual/physical abuse.    Section VII - Other Areas of Clinical Concern  The highest grade achieved is 12th grade with the overall quality of school experience being described as not assessed.  The patient is currently employed and speaks English as a primary language.  The patient has no communication impairments affecting communication. The patient's  preference for learning can be described as: can read and write adequately.  The patient's hearing is normal.  The patient's vision is normal.      Kaye Edison, MSW, Supervisee in Social Work
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
## 2023-02-09 NOTE — ED Notes (Signed)
 Pt wanded and in green gown. Belongings in locker 23

## 2023-02-10 LAB — URINALYSIS WITH REFLEX TO CULTURE
BACTERIA, URINE: NEGATIVE /HPF
Bilirubin, Urine: NEGATIVE
Blood, Urine: NEGATIVE
Glucose, Ur: NEGATIVE mg/dL
Ketones, Urine: NEGATIVE mg/dL
Leukocyte Esterase, Urine: NEGATIVE
Nitrite, Urine: NEGATIVE
Protein, UA: NEGATIVE mg/dL
Specific Gravity, UA: 1.019 (ref 1.003–1.030)
Urobilinogen, Urine: 0.1 EU/dL (ref 0.1–1.0)
pH, Urine: 5 (ref 5.0–8.0)

## 2023-02-10 LAB — EKG 12-LEAD
Atrial Rate: 96 {beats}/min
Diagnosis: NORMAL
P Axis: 75 degrees
P-R Interval: 140 ms
Q-T Interval: 356 ms
QRS Duration: 90 ms
QTc Calculation (Bazett): 449 ms
R Axis: 61 degrees
T Axis: 49 degrees
Ventricular Rate: 96 {beats}/min

## 2023-02-10 LAB — HEPATIC FUNCTION PANEL
ALT: 12 U/L (ref 12–78)
AST: 13 U/L — ABNORMAL LOW (ref 15–37)
Albumin/Globulin Ratio: 0.7 — ABNORMAL LOW (ref 1.1–2.2)
Albumin: 3.1 g/dL — ABNORMAL LOW (ref 3.5–5.0)
Alk Phosphatase: 73 U/L (ref 45–117)
Bilirubin, Direct: <0.1 mg/dL (ref 0.0–0.2)
Globulin: 4.7 g/dL — ABNORMAL HIGH (ref 2.0–4.0)
Total Bilirubin: 0.3 mg/dL (ref 0.2–1.0)
Total Protein: 7.8 g/dL (ref 6.4–8.2)

## 2023-02-10 LAB — URINE DRUG SCREEN
Amphetamine, Urine: NEGATIVE
Barbiturates, Urine: NEGATIVE
Benzodiazepines, Urine: NEGATIVE
Cocaine, Urine: POSITIVE — AB
Methadone, Urine: NEGATIVE
Opiates, Urine: NEGATIVE
Phencyclidine, Urine: NEGATIVE
THC, TH-Cannabinol, Urine: POSITIVE — AB

## 2023-02-10 LAB — ETHANOL: Ethanol Lvl: <10 mg/dL (ref <10)

## 2023-02-10 LAB — COVID-19, RAPID: SARS-CoV-2, PCR: NOT DETECTED

## 2023-02-10 LAB — PREGNANCY, URINE: Pregnancy, Urine: NEGATIVE

## 2023-02-10 MED ORDER — HYDROCORTISONE 1 % EX CREA
1 | CUTANEOUS | 1 refills | Status: AC
Start: 2023-02-10 — End: ?

## 2023-02-10 MED ORDER — ALUM & MAG HYDROXIDE-SIMETH 200-200-20 MG/5ML PO SUSP
200-200-20 | Freq: Four times a day (QID) | ORAL | Status: DC | PRN
Start: 2023-02-10 — End: 2023-02-13

## 2023-02-10 MED ORDER — MAGNESIUM HYDROXIDE 400 MG/5ML PO SUSP
400 | Freq: Every day | ORAL | Status: DC | PRN
Start: 2023-02-10 — End: 2023-02-13

## 2023-02-10 MED ORDER — ACETAMINOPHEN 325 MG PO TABS
325 | ORAL | Status: DC | PRN
Start: 2023-02-10 — End: 2023-02-13
  Administered 2023-02-11 (×2): 650 mg via ORAL

## 2023-02-10 MED ORDER — HYDROCORTISONE 1 % EX CREA
1 | Freq: Two times a day (BID) | CUTANEOUS | Status: DC
Start: 2023-02-10 — End: 2023-02-13

## 2023-02-10 MED ORDER — ONDANSETRON 4 MG PO TBDP
4 | Freq: Three times a day (TID) | ORAL | Status: DC | PRN
Start: 2023-02-10 — End: 2023-02-13

## 2023-02-10 MED ORDER — DOCUSATE SODIUM 100 MG PO CAPS
100 | Freq: Two times a day (BID) | ORAL | Status: DC
Start: 2023-02-10 — End: 2023-02-13
  Administered 2023-02-10 – 2023-02-12 (×3): 100 mg via ORAL

## 2023-02-10 MED ORDER — TRAZODONE HCL 50 MG PO TABS
50 | Freq: Every evening | ORAL | Status: DC | PRN
Start: 2023-02-10 — End: 2023-02-11

## 2023-02-10 MED ORDER — HYDROXYZINE HCL 50 MG PO TABS
50 | Freq: Three times a day (TID) | ORAL | Status: DC | PRN
Start: 2023-02-10 — End: 2023-02-13
  Administered 2023-02-12 – 2023-02-13 (×3): 50 mg via ORAL

## 2023-02-10 MED ADMIN — hydrocortisone 1 % cream: TOPICAL | @ 13:00:00 | NDC 00536140795

## 2023-02-10 MED FILL — HYDROCORTISONE/ALOE MAX STR 1 % EX CREA: 1 % | CUTANEOUS | Qty: 28

## 2023-02-10 MED FILL — DOCUSATE SODIUM 100 MG PO CAPS: 100 MG | ORAL | Qty: 1

## 2023-02-10 NOTE — Plan of Care (Signed)
 Problem: Pain  Goal: Verbalizes/displays adequate comfort level or baseline comfort level  Outcome: Progressing     Problem: Self Harm/Suicidality  Goal: Will have no self-injury during hospital stay  Description: INTERVENTIONS:  1.  Ensure constant observer at bedside with Q15M safety checks  2.  Maintain a safe environment  3.  Secure patient belongings  4.  Ensure family/visitors adhere to safety recommendations  5.  Ensure safety tray has been added to patient's diet order  6.  Every shift and PRN: Re-assess suicidal risk via Frequent Screener    Outcome: Progressing    -no c/o pain.  -pt denies having any suicidal thoughts.

## 2023-02-10 NOTE — BH Note (Signed)
 PSA PART II ADDITIONAL INFORMATION        Access To Fire Arms: No    Substance Use: Yes    Last Use: a few days ago    Type of Substance: marijuana and cocaine    Frequency of Use: not often    Request to See Chaplain: No    If yes, notified Chaplain: No    Guardian:No    Guardian Contact:n/a    Release of Information Signed: Yes    Release of Information Signed For: Twyla and TaShawn at [facility] (    Goal: to get some help

## 2023-02-10 NOTE — Behavioral Health Treatment Team (Signed)
 Writer spoke with Connie Morgan [phone]  Writer informed him that the pt will be out of work for a few days and that when she discharges, Clinical research associate will provide her with a work Scientist, research (medical).

## 2023-02-10 NOTE — ED Notes (Signed)
 Verbal report given to 2S nurse. All questions answered

## 2023-02-10 NOTE — Consults (Signed)
 Hospitalist Consultation Note    NAME:  Connie Morgan   DOB:   Aug 01, 1980   MRN:   [id number]     ATTENDING: Espiridion Code, MD  PCP:  None, None    Date/Time:  02/10/2023 9:39 AM      Recommendations/Plan:       Principal Problem:    Schizoaffective disorder (HCC)  Resolved Problems:    * No resolved hospital problems. *           Code Status:   DVT Prophylaxis:           Subjective:   REQUESTING PHYSICIAN:  REASON FOR CONSULT:      Connie Morgan is a 42 y.o.  African American female who I was asked to see for inpatient psychiatric admission and medical evaluation patient denies chest pain no shortness of breath no cough patient is positive for cocaine cocaine and also positive for cannabis use denies any history of smoking as a history of asthma            Past Medical History:   Diagnosis Date    Asthma     Bleeding hemorrhoids     Depression       No past surgical history on file.  Social History     Tobacco Use    Smoking status: Former     Current packs/day: 0.25     Types: Cigarettes    Smokeless tobacco: Never    Tobacco comments:     Quit smoking: 1-2 cigarettes a day   Substance Use Topics    Alcohol use: No      Family History   Problem Relation Age of Onset    No Known Problems Mother     No Known Problems Father        No Known Allergies   Prior to Admission medications    Medication Sig Start Date End Date Taking? Authorizing Provider   hydrocortisone  (ALA-CORT ) 1 % cream Apply topically 2 times daily. 02/09/23  Yes Sequeira, Joran, MD   ondansetron  (ZOFRAN -ODT) 4 MG disintegrating tablet Place 1 tablet under the tongue every 8 hours as needed for Nausea or Vomiting 01/18/23   Douglass Tinnie ORN, MD   miSOPROStol  (CYTOTEC ) 100 MCG tablet Take 4 tablets by mouth 4 times daily for 3 days 01/29/22 02/01/22  Zelensky, Paul K, MD   Prenatal Vit-Fe Fumarate-FA (PRENATAL VITAMIN) 27-0.8 MG TABS Take 1 tablet by mouth daily 12/20/21   Roselyn Carlin Charleston, MD   docusate sodium  (COLACE) 100 MG capsule Take 1 capsule by  mouth 2 times daily 12/12/21   Al Hazmi, Ahmed I, MD       REVIEW OF SYSTEMS:     Total of 12 systems reviewed as follows:   I am not able to complete the review of systems because:   The patient is intubated and sedated    The patient has altered mental status due to his acute medical problems    The patient has baseline aphasia from prior stroke(s)    The patient has baseline dementia and is not reliable historian                 POSITIVE= underlined text  Negative = text not underlined  General:  fever, chills, sweats, generalized weakness, weight loss/gain,      loss of appetite   Eyes:    blurred vision, eye pain, loss of vision, double vision  ENT:    rhinorrhea, pharyngitis   Respiratory:  cough, sputum production, SOB, wheezing, DOE, pleuritic pain   Cardiology:   chest pain, palpitations, orthopnea, PND, edema, syncope   Gastrointestinal:  abdominal pain , N/V, dysphagia, diarrhea, constipation, bleeding   Genitourinary:  frequency, urgency, dysuria, hematuria, incontinence   Muskuloskeletal :  arthralgia, myalgia   Hematology:  easy bruising, nose or gum bleeding, lymphadenopathy   Dermatological: rash, ulceration, pruritis   Endocrine:   hot flashes or polydipsia   Neurological:  headache, dizziness, confusion, focal weakness, paresthesia,     Speech difficulties, memory loss, gait disturbance  Psychological: Feelings of anxiety, depression, agitation    Objective:   VITALS:    BP 118/80   Pulse 75   Temp 97.7 F (36.5 C) (Oral)   Resp 18   Ht 1.753 m (5' 9)   Wt 73.5 kg (162 lb)   SpO2 99%   BMI 23.92 kg/m   Temp (24hrs), Avg:98.3 F (36.8 C), Min:97.7 F (36.5 C), Max:99 F (37.2 C)      PHYSICAL EXAM:   General:    Alert, cooperative, no distress, appears stated age.     HEENT: Atraumatic, anicteric sclerae, pink conjunctivae     No oral ulcers, mucosa moist, throat clear  Neck:  Supple, symmetrical,  thyroid: non tender  Lungs:   Clear to auscultation bilaterally.  No Wheezing or Rhonchi.  No rales.  Chest wall:  No tenderness  No Accessory muscle use.  Heart:   Regular  rhythm,  No  murmur   No edema  Abdomen:   Soft, non-tender. Not distended.  Bowel sounds normal  Extremities: No cyanosis.  No clubbing  Skin:     Not pale.  Not Jaundiced  No rashes   Psych:  Good insight.  Not depressed.  Not anxious or agitated.  Neurologic: EOMs intact. No facial asymmetry. No aphasia or slurred speech. Symmetrical strength, Alert and oriented X 4.     _______________________________________________________________________  Care Plan discussed with:    Comments   Patient     Family      RN     Care Manager                    Consultant:      ____________________________________________________________________  TOTAL TIME:     20 mins    Comments     Reviewed previous records   >50% of visit spent in counseling and coordination of care  Discussion with patient and/or family and questions answered       Critical Care Provided     Minutes non procedure based  ________________________________________________________________________  Signed: [facility], MD      Procedures: see electronic medical records for all procedures/Xrays and details which were not copied into this note but were reviewed prior to creation of Plan.    LAB DATA REVIEWED:    Recent Results (from the past 24 hour(s))   EKG 12 Lead "IF" over 26 years of age, and Upper Abd pain, SOB, Diaphoresis, Diabetes, or Tachycardia greater than or equal to 120 bmp. (Use as indication for order).    Collection Time: 02/09/23  6:47 PM   Result Value Ref Range    Ventricular Rate 96 BPM    Atrial Rate 96 BPM    P-R Interval 140 ms    QRS Duration 90 ms    Q-T Interval 356 ms    QTc Calculation (Bazett) 449 ms    P Axis 75 degrees    R Axis 61  degrees    T Axis 49 degrees    Diagnosis       Normal sinus rhythm with sinus arrhythmia  Possible Left atrial enlargement  Borderline ECG  No previous ECGs available  Confirmed by JOSEPH MD, MATHEW ([id number]) on 02/09/2023  8:12:27 PM     CBC with Auto Differential    Collection Time: 02/09/23  7:03 PM   Result Value Ref Range    WBC 5.7 3.6 - 11.0 K/uL    RBC 4.18 3.80 - 5.20 M/uL    Hemoglobin 10.8 (L) 11.5 - 16.0 g/dL    Hematocrit 68.8 (L) 35.0 - 47.0 %    MCV 74.4 (L) 80.0 - 99.0 FL    MCH 25.8 (L) 26.0 - 34.0 PG    MCHC 34.7 30.0 - 36.5 g/dL    RDW 84.1 (H) 88.4 - 14.5 %    Platelets 422 (H) 150 - 400 K/uL    MPV 8.7 (L) 8.9 - 12.9 FL    Nucleated RBCs 0.0 0.0 PER 100 WBC    nRBC 0.00 0.00 - 0.01 K/uL    Neutrophils % 60 32 - 75 %    Lymphocytes % 27 12 - 49 %    Monocytes % 10 5 - 13 %    Eosinophils % 2 0 - 7 %    Basophils % 1 0 - 1 %    Immature Granulocytes % 0 0 - 0.5 %    Neutrophils Absolute 3.4 1.8 - 8.0 K/UL    Lymphocytes Absolute 1.5 0.8 - 3.5 K/UL    Monocytes Absolute 0.5 0.0 - 1.0 K/UL    Eosinophils Absolute 0.1 0.0 - 0.4 K/UL    Basophils Absolute 0.0 0.0 - 0.1 K/UL    Immature Granulocytes Absolute 0.0 0.00 - 0.04 K/UL    Differential Type AUTOMATED     Basic Metabolic Panel    Collection Time: 02/09/23  7:03 PM   Result Value Ref Range    Sodium 140 136 - 145 mmol/L    Potassium 4.4 3.5 - 5.1 mmol/L    Chloride 109 (H) 97 - 108 mmol/L    CO2 24 21 - 32 mmol/L    Anion Gap 7 2 - 12 mmol/L    Glucose 94 65 - 100 mg/dL    BUN 15 6 - 20 mg/dL    Creatinine 9.20 9.44 - 1.02 mg/dL    BUN/Creatinine Ratio 19 12 - 20      Est, Glom Filt Rate >90 >60 ml/min/1.68m2    Calcium 8.9 8.5 - 10.1 mg/dL   Ethanol    Collection Time: 02/09/23  7:03 PM   Result Value Ref Range    Ethanol Lvl <10 <10 mg/dL   Hepatic Function Panel    Collection Time: 02/09/23  7:03 PM   Result Value Ref Range    Total Protein 7.8 6.4 - 8.2 g/dL    Albumin 3.1 (L) 3.5 - 5.0 g/dL    Globulin 4.7 (H) 2.0 - 4.0 g/dL    Albumin/Globulin Ratio 0.7 (L) 1.1 - 2.2      Total Bilirubin 0.3 0.2 - 1.0 mg/dL    Bilirubin, Direct <9.8 0.0 - 0.2 mg/dL    Alk Phosphatase 73 45 - 117 U/L    AST 13 (L) 15 - 37 U/L    ALT 12 12 - 78 U/L   Urine Drug Screen     Collection Time: 02/09/23  8:04 PM   Result Value Ref Range  Amphetamine, Urine Negative Negative      Barbiturates, Urine Negative Negative      Benzodiazepines, Urine Negative Negative      Cocaine, Urine Positive (A) Negative      Methadone, Urine Negative Negative      Opiates, Urine Negative Negative      Phencyclidine, Urine Negative Negative      THC, TH-Cannabinol, Urine Positive (A) Negative      Comments:        This test is a screen for drugs of abuse in a medical setting only (i.e., they are unconfirmed results and as such must not be used for non-medical purposes, e.g.,employment testing, legal testing). Due to its inherent nature, false positive (FP) and false negative (FN) results may be obtained. Therefore, if necessary for medical care, recommend confirmation of positive findings by GC/MS.     COVID-19, Rapid    Collection Time: 02/09/23  8:04 PM    Specimen: Nasopharyngeal   Result Value Ref Range    Source Nasopharyngeal      SARS-CoV-2, PCR Not Detected Not Detected     Urinalysis with Reflex to Culture    Collection Time: 02/09/23  8:04 PM    Specimen: Urine   Result Value Ref Range    Color, UA Yellow/Straw      Appearance Clear Clear      Specific Gravity, UA 1.019 1.003 - 1.030      pH, Urine 5.0 5.0 - 8.0      Protein, UA Negative Negative mg/dL    Glucose, Ur Negative Negative mg/dL    Ketones, Urine Negative Negative mg/dL    Bilirubin, Urine Negative Negative      Blood, Urine Negative Negative      Urobilinogen, Urine 0.1 0.1 - 1.0 EU/dL    Nitrite, Urine Negative Negative      Leukocyte Esterase, Urine Negative Negative      WBC, UA 0-4 0 - 4 /hpf    RBC, UA 0-5 0 - 5 /hpf    Epithelial Cells, UA Few Few /lpf    BACTERIA, URINE Negative Negative /hpf    Urine Culture if Indicated Culture not indicated by UA result Culture not indicated by UA result     Pregnancy, Urine    Collection Time: 02/09/23  8:04 PM   Result Value Ref Range    Pregnancy, Urine Negative Negative          _____________________________  Hospitalist: [facility], MD

## 2023-02-10 NOTE — Group Note (Signed)
 Group Therapy Note    Date: 02/10/2023    Group Start Time: 9064  Group End Time: 1000  Group Topic: [employer]    SSR 2 [facility] ACUTE    Charlott Cones V        Group Therapy Note    Attendees: 3 out of 11       Patient's Goal:      Notes:  Patient  encouraged to attend but declined.    Status After Intervention:      Participation Level:     Participation Quality:       Speech:        Thought Process/Content:       Affective Functioning:       Mood:       Level of consciousness:        Response to Learning:       Endings:     Modes of Intervention:       Discipline Responsible: [employer] [facility] [employer]      Signature:  Cones LULLA Charlott

## 2023-02-10 NOTE — Care Coordination (Signed)
 02/10/23 1331   ITP   Date of Plan 02/10/23   Date of Next Review 02/17/23   Primary Diagnosis Code Schizoaffective disorder (HCC) F25.9   Barriers to Treatment Need for psychoeducation;Psychiatric symptom (comment);Other (comment)  (pt is guarded, lethargic and did not want to speak to the clinical research associate)   Strengths Incorporated in Plan Acknowledging need for assistance   Plan of Care   Long Term Goal (LTG) Stated in patient/guardian terms to get help   Short Term Goal 1   Short Term Goal 1 Client will use words to express feelings, wants, and needs   Baseline Functioning Pt is guarded and superfical with her answers with the writer   Target Pt will communicate her needs, wants and feelings with the writer 80% of the time   Objectives Client will participate in individual therapy;Client will participate in group therapy   Intervention 1 Acknowledge client strengths;Indvidual therapy   Frequency daily   Measured by Self report;Staff observation   Staff Responsible [facility] staff;Clinical staff   Intervention 2 Group therapy   Frequency daily   Measured by Behavioral data;Self report;Staff observation   Staff Responsible [facility] staff;Clinical staff   Intervention 3 Referral to community services   Frequency prior to discharge   Measured by Behavioral data   Staff Responsible [facility] staff   STG Goal 1 Status: Patient Appears to be  Treatment plan goal is unmet at this time   Short Term Goal 2   Short Term Goal 2 Client will learn and demonstrate effective coping skills   Baseline Functioning Pt used cocaine to cope with stressors   Target Pt will learn and utilize two healthy coping skills   Objectives Client will participate in individual therapy;Client will participate in group therapy   Intervention 1 Acknowledge client strengths;Indvidual therapy   Frequency daily   Measured by Self report;Staff observation   Staff Responsible [facility] staff;Clinical staff   Intervention 2 Group therapy   Frequency daily   Measured by Self  report;Staff observation   Staff Responsible [facility] staff;Clinical staff   Intervention 3 Monitor medications   Frequency daily   Measured by Staff observation   Staff Responsible Clinical staff   STG Goal 2 Status: Patient Appears to be  Partially meeting treatment plan goal   Crisis/Safety/Discharge Plan   Crisis/Safety Plan Standard program interventions and protocol   Comprehensive Assessment Completion Date 02/10/23   Discharge Plan to stabilize the pt and connect with outpatient resources

## 2023-02-10 NOTE — BH Note (Signed)
 Alert and oriented x 4. Affect and mood are flat, sad and depressed. Tearful at times, reports I do not want to be here. Medication and meal compliant. Isolative to her room, did not attend group despite prompting. Pt walking around unit without walker, gait is steady. Writer asked pt why she uses wheelchair/ walker, no response was given. Poor eye contact, guarded and not interested in sharing information at this time. Denies SI/HI/AVH at this time. Pt isolative to her room.
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
## 2023-02-10 NOTE — Group Note (Signed)
 Group Therapy Note    Date: 02/10/2023    Group Start Time: 1535  Group End Time: 1620  Group Topic: Recreational    SSR 2 BH NON ACUTE    Myriam Sober        Group Therapy Note    Facilitated leisure skills group to reinforce positive coping and to manage mood through music, social interaction, group activities and art task       Attendees: 5/10      Notes:  Encouraged but did not attend    Discipline Responsible: Recreational Therapist      Signature:  Sober Myriam, [address]

## 2023-02-10 NOTE — Other (Signed)
 Per Access, pt accepted to [facility] 2S by Dr. Lenis Noon to bed 236-02.

## 2023-02-10 NOTE — BH Note (Signed)
 Admission Note: the pt is a 68 YOF who was admitted from the ER. Pt arrived in a WC and escorted by ER staff and security.     Pt stated that she was not feeling good and has had suicidal thoughts for a couple of days. Pt denies having suicidal thoughts at this time. The pt is tearful, tired, and does not want to participate in the admission process. Pt signed consents. Pt appears very anxious, agitated, withdrawn, sad, depressed, with a constricted affect. Pt stated that she does not want to be here. She appears paranoid and preoccupied. The pt is not forthcoming with information and changes what she says.     Pt is A+O; ambulates with a walker; no c/o pain or discomfort; respirations unlabored; moves all extremities; tattoos to bil arms and lower left leg; scares to bil arms and bil knees; skin intact.     Legal Status: Voluntary    Dx: Schizoaffective Disorder    Psychiatrist: Vasu    H&P: Agada    Labs: positive for Cocaine and THC    VS: BP 124/86  HR 80  R  18  T 97.7  SA02 99% on RA    BH Hx: Schizophrenia, bipolar disorder, anxiety, and depression; suicide attempt 2023 by OD    Medical Hx: Asthma; Bleeding hemorrhoids; cystitis, vaginitis; trichomoniasis; perineal pain; miscarriage 2023; pt uses a WC at home and stated that she uses a walker while in the hospital; pt fell x 6 months ago; pt stated that her legs give out on her - PT consult placed for evaluation.      Medication Hx: noncompliant; stopped taking medications x 2 wks ago; sees [facility] for medical management    ALLERGIES: NKA    Social Hx: THC; cocaine    Legal Hx: unknown    Education: 12 th grade    Employment:     Search: [employer] Patient Search, Personal Items Inventory Sheet, Evidence Chain Of Custody Tracking Form.      Plan: It Consultant. Medication Management. Group Therapy.    Pt signed consents; search completed; provided with a walker; escorted to the room, and provided with extra blankets.

## 2023-02-10 NOTE — BH Note (Signed)
 INITIAL PSYCHIATRIC EVALUATION            IDENTIFICATION:    Patient Name  Connie Morgan   Date of Birth 28-Mar-1981   CSN [id number]   Medical Record Number  [id number]      Age  42 y.o.   PCP None, None   Admit date:  02/09/2023    Room Number  236/01  @ [facility]   Date of Service  02/10/2023            HISTORY         REASON FOR HOSPITALIZATION:    CC: Schizoaffective disorder, depression unspecified type, tension headache, bleeding hemorroids       HISTORY OF PRESENT ILLNESS:     The patient, Connie Morgan, is a 42 y.o. female  [name] presented to ER with medical complaints including bleeding hemorrhoids, feeling like food getting stuck when eating, occasional SOB, and lower abdominal pain. Pt stated there were other stuff going on, like mental stuff and was tearful in triage. Pt reported being depressed beginning the day before arriving at the ED. Pt endorsed thoughts of wanting to go to sleep and not wake back up. Denied any intention to self harm. Pt also reported wanting to hurt somebody she deals with.    Per [facility]SMART note on 02/09/2023, patient stated she had been feeling increasingly suicidal over the past two days. Pt expressed feelings of hopelessness and worthlessness. Pt endorsed symptoms of isolation, withdrawal, crying spells, low mood, poor sleep and appetite. Pt reports stressors within her relationship and adult children. She is currently single and lives with her adult children. She has adult children and younger children in the foster care system. She plans to return home upon discharge. Her source of income comes from employment. Pt endorses that when she is not working, she spends all day in her room. Pt endorses passive SI but also reports that her auditory hallucinations may be command in nature. Stating I'll just be cooking in the kitchen, and then next thing I know, I grab a knife. Pt reports she spends all night pacing and has not eaten much in the past four days.      When seen today, pt is just awakening and her eyes are half open. She has not had anything to eat yet and reports feeling tired. Pt has a walker next to her bed.       PAST PSYCHIATRIC HISTORY:   Per patient, she has history of schizophrenia, bipolar disorder, anxiety, and depression. Pt sees [facility] for medication management and therapy. Pt reports she typically takes Zyprexa, celexa and hydroxyzine . Pt reports she stopped taking her medications approximately two weeks ago due to them making her too drowsy. She also reports she does not find her therapist helpful.     TRAUMA HISTORY:   No history of sexual or physical abuse.        ALLERGIES: No Known Allergies   MEDICATIONS PRIOR TO ADMISSION:   Prior to Admission Medications   Prescriptions Last Dose Informant Patient Reported? Taking?   Prenatal Vit-Fe Fumarate-FA (PRENATAL VITAMIN) 27-0.8 MG TABS Unknown  No No   Sig: Take 1 tablet by mouth daily   docusate sodium  (COLACE) 100 MG capsule Unknown  No No   Sig: Take 1 capsule by mouth 2 times daily   miSOPROStol  (CYTOTEC ) 100 MCG tablet   No No   Sig: Take 4 tablets by mouth 4 times daily for 3 days  ondansetron  (ZOFRAN -ODT) 4 MG disintegrating tablet Unknown  No No   Sig: Place 1 tablet under the tongue every 8 hours as needed for Nausea or Vomiting      Facility-Administered Medications: None       PAST MEDICAL HISTORY:   Past Medical History:   Diagnosis Date    Asthma     Bleeding hemorrhoids     Depression    No past surgical history on file.   SOCIAL HISTORY:   Social History     Socioeconomic History    Marital status: Single     Spouse name: Not on file    Number of children: Not on file    Years of education: Not on file    Highest education level: Not on file   Occupational History    Not on file   Tobacco Use    Smoking status: Former     Current packs/day: 0.25     Types: Cigarettes    Smokeless tobacco: Never    Tobacco comments:     Quit smoking: 1-2 cigarettes a day   Vaping Use    Vaping  status: Never Used   Substance and Sexual Activity    Alcohol use: No    Drug use: No    Sexual activity: Not on file     Comment: Unprotected sex with one person x 1 year.    Other Topics Concern    Not on file   Social History Narrative    Not on file     Social Determinants of Health     Financial Resource Strain: Not on file   Food Insecurity: Food Insecurity Present (02/10/2023)    Hunger Vital Sign     Worried About Running Out of Food in the Last Year: Never true     Ran Out of Food in the Last Year: Sometimes true   Transportation Needs: No Transportation Needs (02/10/2023)    PRAPARE - Therapist, Art (Medical): No     Lack of Transportation (Non-Medical): No   Physical Activity: Not on file   Stress: Not on file   Social Connections: Patient Declined (02/10/2023)    Social Connections [facility])     If for any reason you need help with day-to-day activities such as bathing, preparing meals, shopping, managing finances, etc., do you get the help you need?: Not on file   Intimate Partner Violence: Not on file   Housing Stability: Low Risk  (02/10/2023)    Housing Stability Vital Sign     Unable to Pay for Housing in the Last Year: No     Number of Times Moved in the Last Year: 0     Homeless in the Last Year: No      FAMILY HISTORY: History reviewed. No pertinent family history.   Family History   Problem Relation Age of Onset    No Known Problems Mother     No Known Problems Father        REVIEW OF SYSTEMS:   Review of Systems   Neurological:  Negative for weakness.       Pertinent items are noted in the History of Present Illness.    All other Systems reviewed and are considered negative.           MENTAL STATUS EXAM & VITALS     MENTAL STATUS EXAM (MSE):    General Presentation Awakened from sleep eyes half open, cooperative  Orientation awake and eyes closed   Vital Signs  See below (reviewed 02/10/2023); Vital Signs (BP, Pulse, & Temp) are within normal limits if not listed below.    Gait and Station Stable/steady, no ataxia   Musculoskeletal System No extrapyramidal symptoms (EPS); no abnormal muscular movements or Tardive Dyskinesia (TD); muscle strength and tone are within normal limits   Language No aphasia or dysarthria   Speech:  faint   Thought Processes  Not logical; slow rate of thoughts;    Thought Associations Minimal conversation   Thought Content Minimal conversation   Suicidal Ideations no plan    Homicidal Ideations none   Mood:  depressed, labile, and sad   Affect:  depressed   Memory recent  limited   Memory remote:  limited   Concentration/Attention:  distractable   Fund of Knowledge average   Insight:  impaired    Reliability fair   Judgment:  impaired        VITALS:     Patient Vitals for the past 24 hrs:   Temp Pulse Resp BP SpO2   02/10/23 0718 97.7 F (36.5 C) 75 18 118/80 99 %   02/10/23 0214 98.1 F (36.7 C) 80 18 124/86 99 %   02/10/23 0015 98.5 F (36.9 C) 84 16 119/76 98 %   02/09/23 1839 99 F (37.2 C) 87 18 (!) 132/92 100 %     Wt Readings from Last 3 Encounters:   02/09/23 73.5 kg (162 lb)   01/18/23 73.5 kg (162 lb)   01/29/22 74.8 kg (165 lb)     Temp Readings from Last 3 Encounters:   02/10/23 97.7 F (36.5 C) (Oral)   01/19/23 97.8 F (36.6 C)   01/29/22 99.1 F (37.3 C) (Oral)     BP Readings from Last 3 Encounters:   02/10/23 118/80   01/19/23 120/79   01/29/22 106/83     Pulse Readings from Last 3 Encounters:   02/10/23 75   01/19/23 60   01/29/22 92            DATA     LABORATORY DATA:  Labs Reviewed   CBC WITH AUTO DIFFERENTIAL - Abnormal; Notable for the following components:       Result Value    Hemoglobin 10.8 (*)     Hematocrit 31.1 (*)     MCV 74.4 (*)     MCH 25.8 (*)     RDW 15.8 (*)     Platelets 422 (*)     MPV 8.7 (*)     All other components within normal limits   BASIC METABOLIC PANEL - Abnormal; Notable for the following components:    Chloride 109 (*)     All other components within normal limits   URINE DRUG SCREEN - Abnormal;  Notable for the following components:    Cocaine, Urine Positive (*)     THC, TH-Cannabinol, Urine Positive (*)     All other components within normal limits   HEPATIC FUNCTION PANEL - Abnormal; Notable for the following components:    Albumin 3.1 (*)     Globulin 4.7 (*)     Albumin/Globulin Ratio 0.7 (*)     AST 13 (*)     All other components within normal limits   COVID-19, RAPID   ETHANOL   URINALYSIS WITH REFLEX TO CULTURE   PREGNANCY, URINE   POC PREGNANCY UR-QUAL     Admission on 02/09/2023   Component Date Value Ref  Range Status    WBC 02/09/2023 5.7  3.6 - 11.0 K/uL Final    RBC 02/09/2023 4.18  3.80 - 5.20 M/uL Final    Hemoglobin 02/09/2023 10.8 (L)  11.5 - 16.0 g/dL Final    Hematocrit 90/94/7975 31.1 (L)  35.0 - 47.0 % Final    MCV 02/09/2023 74.4 (L)  80.0 - 99.0 FL Final    MCH 02/09/2023 25.8 (L)  26.0 - 34.0 PG Final    MCHC 02/09/2023 34.7  30.0 - 36.5 g/dL Final    RDW 90/94/7975 15.8 (H)  11.5 - 14.5 % Final    Platelets 02/09/2023 422 (H)  150 - 400 K/uL Final    MPV 02/09/2023 8.7 (L)  8.9 - 12.9 FL Final    Nucleated RBCs 02/09/2023 0.0  0.0 PER 100 WBC Final    nRBC 02/09/2023 0.00  0.00 - 0.01 K/uL Final    Neutrophils % 02/09/2023 60  32 - 75 % Final    Lymphocytes % 02/09/2023 27  12 - 49 % Final    Monocytes % 02/09/2023 10  5 - 13 % Final    Eosinophils % 02/09/2023 2  0 - 7 % Final    Basophils % 02/09/2023 1  0 - 1 % Final    Immature Granulocytes % 02/09/2023 0  0 - 0.5 % Final    Neutrophils Absolute 02/09/2023 3.4  1.8 - 8.0 K/UL Final    Lymphocytes Absolute 02/09/2023 1.5  0.8 - 3.5 K/UL Final    Monocytes Absolute 02/09/2023 0.5  0.0 - 1.0 K/UL Final    Eosinophils Absolute 02/09/2023 0.1  0.0 - 0.4 K/UL Final    Basophils Absolute 02/09/2023 0.0  0.0 - 0.1 K/UL Final    Immature Granulocytes Absolute 02/09/2023 0.0  0.00 - 0.04 K/UL Final    Differential Type 02/09/2023 AUTOMATED    Final    Sodium 02/09/2023 140  136 - 145 mmol/L Final    Potassium 02/09/2023 4.4  3.5 - 5.1  mmol/L Final    Chloride 02/09/2023 109 (H)  97 - 108 mmol/L Final    CO2 02/09/2023 24  21 - 32 mmol/L Final    Anion Gap 02/09/2023 7  2 - 12 mmol/L Final    Glucose 02/09/2023 94  65 - 100 mg/dL Final    BUN 90/94/7975 15  6 - 20 mg/dL Final    Creatinine 90/94/7975 0.79  0.55 - 1.02 mg/dL Final    BUN/Creatinine Ratio 02/09/2023 19  12 - 20   Final    Est, Glom Filt Rate 02/09/2023 >90  >60 ml/min/1.48m2 Final    Calcium 02/09/2023 8.9  8.5 - 10.1 mg/dL Final    Amphetamine, Urine 02/09/2023 Negative  Negative   Final    Barbiturates, Urine 02/09/2023 Negative  Negative   Final    Benzodiazepines, Urine 02/09/2023 Negative  Negative   Final    Cocaine, Urine 02/09/2023 Positive (A)  Negative   Final    Methadone, Urine 02/09/2023 Negative  Negative   Final    Opiates, Urine 02/09/2023 Negative  Negative   Final    Phencyclidine, Urine 02/09/2023 Negative  Negative   Final    THC, TH-Cannabinol, Urine 02/09/2023 Positive (A)  Negative   Final    Comments: 02/09/2023     Final                    Value:This test is a screen for drugs of abuse in a  medical setting only (i.e., they are unconfirmed results and as such must not be used for non-medical purposes, e.g.,employment testing, legal testing). Due to its inherent nature, false positive (FP) and false negative (FN) results may be obtained. Therefore, if necessary for medical care, recommend confirmation of positive findings by GC/MS.      Ventricular Rate 02/09/2023 96  BPM Final    Atrial Rate 02/09/2023 96  BPM Final    P-R Interval 02/09/2023 140  ms Final    QRS Duration 02/09/2023 90  ms Final    Q-T Interval 02/09/2023 356  ms Final    QTc Calculation (Bazett) 02/09/2023 449  ms Final    P Axis 02/09/2023 75  degrees Final    R Axis 02/09/2023 61  degrees Final    T Axis 02/09/2023 49  degrees Final    Diagnosis 02/09/2023    Final                    Value:Normal sinus rhythm with sinus arrhythmia  Possible Left atrial enlargement  Borderline ECG  No  previous ECGs available  Confirmed by JOSEPH MD, MATHEW ([id number]) on 02/09/2023 8:12:27 PM      Source 02/09/2023 Nasopharyngeal    Final    SARS-CoV-2, PCR 02/09/2023 Not Detected  Not Detected   Final    Ethanol Lvl 02/09/2023 <10  <10 mg/dL Final    Total Protein 02/09/2023 7.8  6.4 - 8.2 g/dL Final    Albumin 90/94/7975 3.1 (L)  3.5 - 5.0 g/dL Final    Globulin 90/94/7975 4.7 (H)  2.0 - 4.0 g/dL Final    Albumin/Globulin Ratio 02/09/2023 0.7 (L)  1.1 - 2.2   Final    Total Bilirubin 02/09/2023 0.3  0.2 - 1.0 mg/dL Final    Bilirubin, Direct 02/09/2023 <0.1  0.0 - 0.2 mg/dL Final    Alk Phosphatase 02/09/2023 73  45 - 117 U/L Final    AST 02/09/2023 13 (L)  15 - 37 U/L Final    ALT 02/09/2023 12  12 - 78 U/L Final    Color, UA 02/09/2023 Yellow/Straw    Final    Appearance 02/09/2023 Clear  Clear   Final    Specific Gravity, UA 02/09/2023 1.019  1.003 - 1.030   Final    pH, Urine 02/09/2023 5.0  5.0 - 8.0   Final    Protein, UA 02/09/2023 Negative  Negative mg/dL Final    Glucose, Ur 90/94/7975 Negative  Negative mg/dL Final    Ketones, Urine 02/09/2023 Negative  Negative mg/dL Final    Bilirubin, Urine 02/09/2023 Negative  Negative   Final    Blood, Urine 02/09/2023 Negative  Negative   Final    Urobilinogen, Urine 02/09/2023 0.1  0.1 - 1.0 EU/dL Final    Nitrite, Urine 02/09/2023 Negative  Negative   Final    Leukocyte Esterase, Urine 02/09/2023 Negative  Negative   Final    WBC, UA 02/09/2023 0-4  0 - 4 /hpf Final    RBC, UA 02/09/2023 0-5  0 - 5 /hpf Final    Epithelial Cells, UA 02/09/2023 Few  Few /lpf Final    BACTERIA, URINE 02/09/2023 Negative  Negative /hpf Final    Urine Culture if Indicated 02/09/2023 Culture not indicated by UA result  Culture not indicated by UA result   Final    Pregnancy, Urine 02/09/2023 Negative  Negative   Final        RADIOLOGY REPORTS:  CT  ABDOMEN PELVIS W IV CONTRAST Additional Contrast? None    Result Date: 01/18/2023  EXAM: CT ABDOMEN PELVIS W IV CONTRAST INDICATION: abd pain,  vomiting COMPARISON: None CONTRAST: 100 mL of Isovue -370. ORAL CONTRAST: None TECHNIQUE: Following intravenous administration of contrast, thin axial images were obtained through the abdomen and pelvis. Coronal and sagittal reconstructions were generated. CT dose reduction was achieved through use of a standardized protocol tailored for this examination and automatic exposure control for dose modulation. FINDINGS: LOWER THORAX: 7 mm pleural-based noncalcified nodule at the left base LIVER: No mass. BILIARY TREE: Gallbladder is within normal limits. CBD is not dilated. SPLEEN: within normal limits. PANCREAS: No mass or ductal dilatation. ADRENALS: Unremarkable. KIDNEYS: No mass, calculus, or hydronephrosis. STOMACH: Unremarkable. SMALL BOWEL: No dilatation or wall thickening. COLON: No dilatation or wall thickening. APPENDIX: Not enlarged PERITONEUM: No ascites or pneumoperitoneum. RETROPERITONEUM: No lymphadenopathy or aortic aneurysm. REPRODUCTIVE ORGANS: Not enlarged URINARY BLADDER: No mass or calculus. BONES: No destructive bone lesion. ABDOMINAL WALL: Ventral hernia containing fat. Small umbilical hernia containing fat ADDITIONAL COMMENTS: N/A     1. No acute abnormality in the abdomen or pelvis 2. 7 mm pleural-based nodule at the left base Guidelines by the Fleischner society (Radiology 2017 special report) suggest that patients with a low risk for lung cancer who have solid nodule(s) greater than 6 mm and less than or equal to 8 mm in diameter should have follow up in approximately 6 to 12 months and, if no change, consider at 18-24 months.  In patients with a higher risk, such as smokers, follow-up is recommended in 6 to 12 months and, if no change, at 18-24 months.  Patients with a known malignancy are at increased risk for metastasis and should receive a three month follow-up. Electronically signed by Susie Brain             MEDICATIONS       ALL MEDICATIONS    Current Facility-Administered Medications    Medication Dose Route Frequency    acetaminophen  (TYLENOL ) tablet 650 mg  650 mg Oral Q4H PRN    hydrOXYzine  HCl (ATARAX ) tablet 50 mg  50 mg Oral TID PRN    traZODone  (DESYREL ) tablet 50 mg  50 mg Oral Nightly PRN    magnesium  hydroxide (MILK OF MAGNESIA) 400 MG/5ML suspension 30 mL  30 mL Oral Daily PRN    aluminum & magnesium  hydroxide-simethicone (MAALOX) 200-200-20 MG/5ML suspension 30 mL  30 mL Oral Q6H PRN    docusate sodium  (COLACE) capsule 100 mg  100 mg Oral BID    ondansetron  (ZOFRAN -ODT) disintegrating tablet 4 mg  4 mg Oral Q8H PRN    hydrocortisone  1 % cream   Topical BID      SCHEDULED MEDICATIONS     docusate sodium   100 mg Oral BID    hydrocortisone    Topical BID                 ASSESSMENT & PLAN        The patient, Milah Recht, is a 42 y.o.  female who presents at this time for treatment of the following diagnoses:  Principal Problem:    Schizoaffective disorder (HCC), depression unspecified type, tension headache, bleeding hemorrhoids     Patient seen this morning presents with minimal engagement.  She expresses still being depressed and did not want to talk much.  She was provided support    PT consult  Medical consult.  Start Cymbalta  30 mg po daily for depression  Start Risperdal  to address hallucinations    Continue current medications.   Will continue to monitor and provide support.    Discussed risks and benefits of the proposed medication. Patient was given the opportunity to ask questions. Informed consent given to the use of the above medications.    Continue to adjust psychiatric and non-psychiatric medications as deemed appropriate & based upon diagnoses and response to treatment.     Reviewed admission labs     Gather additional collateral information from family and o/p treatment team to further elucidate the nature of patient's psychopathology and baselline level of psychiatric functioning.    Placed on close observation, for safety    Patient to engage in individual therapy,  group therapy support group, psychoeducational group, safety planning.      Patient continue to address stressors that led to hospitalization      Strengths-patient able to express self, average intelligence    Weakness-poor coping, comorbid substance abuse, limited primary support, psychosocial stressors    Discharge Criteria-  Patient is able to show progress and improvement in neurovegetative symptoms of depression, si   Patient is no longer actively suicidal or homicidal and has no command hallucinations.   Patient  is able to present with healthy ways to cope with current stressors.        Current Facility-Administered Medications:     acetaminophen  (TYLENOL ) tablet 650 mg, 650 mg, Oral, Q4H PRN, Enisa Runyan, MD    hydrOXYzine  HCl (ATARAX ) tablet 50 mg, 50 mg, Oral, TID PRN, Trask Vosler, MD    traZODone  (DESYREL ) tablet 50 mg, 50 mg, Oral, Nightly PRN, Lakima Dona, MD    magnesium  hydroxide (MILK OF MAGNESIA) 400 MG/5ML suspension 30 mL, 30 mL, Oral, Daily PRN, Rafeef Lau, MD    aluminum & magnesium  hydroxide-simethicone (MAALOX) 200-200-20 MG/5ML suspension 30 mL, 30 mL, Oral, Q6H PRN, Zadaya Cuadra, MD    docusate sodium  (COLACE) capsule 100 mg, 100 mg, Oral, BID, Trey Bebee, MD, 100 mg at 02/10/23 9167    ondansetron  (ZOFRAN -ODT) disintegrating tablet 4 mg, 4 mg, Oral, Q8H PRN, Avyon Herendeen, MD    DULoxetine  (CYMBALTA ) extended release capsule 30 mg, 30 mg, Oral, Daily, Burt Piatek, MD    risperiDONE  (RISPERDAL ) tablet 1 mg, 1 mg, Oral, BID, Anikin Prosser, MD    hydrocortisone  1 % cream, , Topical, BID, Sequeira, Joran, MD, Given at 02/10/23 0832       ESTIMATED LENGTH OF STAY:    5-7 days

## 2023-02-10 NOTE — Behavioral Health Treatment Team (Signed)
 Per accepting Dr Lenis Noon, no need for 1:1 on transfer to [facility].

## 2023-02-10 NOTE — Group Note (Signed)
 Group Therapy Note    Date: 02/10/2023    Group Start Time: 1330  Group End Time: 1400  Group Topic: Process Group - Inpatient    [facility]    Connie Morgan        Group Therapy Note: This clinical research associate facilitated a group where each individual discussed their strengths and qualities.     Attendees: 3       Patient's Goal:  to attend groups.    Notes:  Pt was in the process of being admitted to the bhu and so could not attend.       Signature:  [employer]

## 2023-02-10 NOTE — Group Note (Signed)
 Group Therapy Note    Date: 02/10/2023    Group Start Time: 1115  Group End Time: 1145  Group Topic: Education Group - Inpatient    [facility] ACUTE    Myriam Sober        Group Therapy Note    Facilitated group to discuss "reaction patterns" to different situations and recognizing patterns in behaviors that need to be changed for future reactions       Attendees: 6/11      Notes:  Encouraged but did not attend    Discipline Responsible: Recreational Therapist      Signature:  Sober Myriam, CTRS

## 2023-02-11 MED ORDER — TRAZODONE HCL 50 MG PO TABS
50 | Freq: Every evening | ORAL | Status: DC
Start: 2023-02-11 — End: 2023-02-12

## 2023-02-11 MED ORDER — RISPERIDONE 1 MG PO TABS
1 | Freq: Two times a day (BID) | ORAL | Status: DC
Start: 2023-02-11 — End: 2023-02-13

## 2023-02-11 MED ORDER — DULOXETINE HCL 30 MG PO CPEP
30 | Freq: Every day | ORAL | Status: DC
Start: 2023-02-11 — End: 2023-02-12

## 2023-02-11 MED ADMIN — hydrocortisone 1 % cream: TOPICAL | @ 01:00:00 | NDC 00536140795

## 2023-02-11 MED ADMIN — DULoxetine (CYMBALTA) extended release capsule 30 mg: 30 mg | ORAL | @ 12:00:00 | NDC 00904704461

## 2023-02-11 MED ADMIN — risperiDONE (RISPERDAL) tablet 1 mg: 1 mg | ORAL | @ 01:00:00 | NDC 00904736261

## 2023-02-11 MED ADMIN — DULoxetine (CYMBALTA) extended release capsule 30 mg: 30 mg | ORAL | @ 01:00:00 | NDC 00904704461

## 2023-02-11 MED ADMIN — risperiDONE (RISPERDAL) tablet 1 mg: 1 mg | ORAL | @ 12:00:00 | NDC 00904736261

## 2023-02-11 MED ADMIN — hydrocortisone 1 % cream: TOPICAL | @ 12:00:00 | NDC 00536140795

## 2023-02-11 MED FILL — DULOXETINE HCL 30 MG PO CPEP: 30 MG | ORAL | Qty: 1

## 2023-02-11 MED FILL — TYLENOL 325 MG PO TABS: 325 MG | ORAL | Qty: 2

## 2023-02-11 MED FILL — RISPERIDONE 1 MG PO TABS: 1 MG | ORAL | Qty: 1

## 2023-02-11 MED FILL — DOCUSATE SODIUM 100 MG PO CAPS: 100 MG | ORAL | Qty: 1

## 2023-02-11 NOTE — Plan of Care (Signed)
 Problem: Pain  Goal: Verbalizes/displays adequate comfort level or baseline comfort level  02/11/2023 0943 by Jonothan Fraction, RN  Outcome: Progressing  02/11/2023 0228 by Myrna Fairy LABOR, RN  Outcome: Progressing     Problem: Self Harm/Suicidality  Goal: Will have no self-injury during hospital stay  Description: INTERVENTIONS:  1.  Ensure constant observer at bedside with Q15M safety checks  2.  Maintain a safe environment  3.  Secure patient belongings  4.  Ensure family/visitors adhere to safety recommendations  5.  Ensure safety tray has been added to patient's diet order  6.  Every shift and PRN: Re-assess suicidal risk via Frequent Screener    02/11/2023 0943 by Jonothan Fraction, RN  Outcome: Progressing  02/11/2023 0228 by Myrna Fairy LABOR, RN  Outcome: Progressing     Problem: Depression  Goal: Will be euthymic at discharge  Description: INTERVENTIONS:  1. Administer medication as ordered  2. Provide emotional support via 1:1 interaction with staff  3. Encourage involvement in milieu/groups/activities  4. Monitor for social isolation  Outcome: Progressing     Problem: Mania  Goal: Will exhibit normal sleep and speech and no impulsivity  Description: INTERVENTIONS:  1. Administer medication as ordered  2. Set limits on impulsive behavior  3. Make attempts to decrease external stimuli as possible  Outcome: Progressing     Problem: Psychosis  Goal: Will report no hallucinations or delusions  Description: INTERVENTIONS:  1. Administer medication as  ordered  2. Assist with reality testing to support increasing orientation  3. Assess if patient's hallucinations or delusions are encouraging self harm or harm to others and intervene as appropriate  Outcome: Progressing     Problem: Behavior  Goal: Pt/Family maintain appropriate behavior and adhere to behavioral management agreement, if implemented  Description: INTERVENTIONS:  1. Assess patient/family's coping skills and  non-compliant behavior (including use of  illegal substances)  2. Notify security of behavior or suspected illegal substances which indicate the need for search of the family and/or belongings  3. Encourage verbalization of thoughts and concerns in a socially appropriate manner  4. Utilize positive, consistent limit setting strategies supporting safety of patient, staff and others  5. Encourage participation in the decision making process about the behavioral management agreement  6. If a visitor's behavior poses a threat to safety call refer to organization policy.  7. Initiate consult with Child Psychotherapist, Psychosocial CNS, Spiritual Care as appropriate  Outcome: Progressing     Problem: Anxiety  Goal: Will report anxiety at manageable levels  Description: INTERVENTIONS:  1. Administer medication as ordered  2. Teach and rehearse alternative coping skills  3. Provide emotional support with 1:1 interaction with staff  Outcome: Progressing     Problem: Drug Abuse/Detox  Goal: Will have no detox symptoms and will verbalize plan for changing drug-related behavior  Description: INTERVENTIONS:  1. Administer medication as ordered  2. Monitor physical status  3. Provide emotional support with 1:1 interaction with staff  4. Encourage  recovery focused treatment   Outcome: Progressing     Problem: Sleep Disturbance  Goal: Will exhibit normal sleeping pattern  Description: INTERVENTIONS:  1. Administer medication as ordered  2. Decrease environmental stimuli, including noise, as appropriate  3. Discourage social isolation and naps during the day  Outcome: Progressing     Problem: Self Care Deficit  Goal: Return ADL status to a safe level of function  Description: INTERVENTIONS:  1. Administer medication as ordered  2. Assess ADL deficits and provide  assistive devices as needed  3. Obtain PT/OT consults as needed  4. Assist and instruct patient to increase activity and self care as tolerated  Outcome: Progressing

## 2023-02-11 NOTE — Group Note (Signed)
 Group Therapy Note    Date: 02/11/2023    Group Start Time: 1115  Group End Time: 1200  Group Topic: Education Group - Inpatient    SSR 2 BH NON ACUTE    Myriam Sober        Group Therapy Note    Facilitated discussion on stress exploration focused on being able to identify daily hassles, major life changes and life circumstances that contribute to stress and identify daily uplifts, healthy coping strategies and protective factors that counteract stress       Attendees: 6/10      Notes:  Encouraged but did not attend    Discipline Responsible: Recreational Therapist      Signature:  Sober Myriam, [address]

## 2023-02-11 NOTE — Plan of Care (Signed)
 Problem: Pain  Goal: Verbalizes/displays adequate comfort level or baseline comfort level  Outcome: Progressing     Problem: Self Harm/Suicidality  Goal: Will have no self-injury during hospital stay  Description: INTERVENTIONS:  1.  Ensure constant observer at bedside with Q15M safety checks  2.  Maintain a safe environment  3.  Secure patient belongings  4.  Ensure family/visitors adhere to safety recommendations  5.  Ensure safety tray has been added to patient's diet order  6.  Every shift and PRN: Re-assess suicidal risk via Frequent Screener    Outcome: Progressing

## 2023-02-11 NOTE — Progress Notes (Signed)
 Progress Note  Date:02/11/2023       Room:236/01  Patient Name:Connie Morgan     Date of Birth:08-Jan-1981     Age:42 y.o.        Subjective    Subjective   Patient did not wake up this morning for assessment.  Mostly stays to herself and noted sleeping.  No acute distress noted.  Patient has been compliant with medications.  Patient's medications reviewed.  No reported concern from nursing staff this morning.  She has been mostly isolated.    Mental status examination Michail patient usually to herself did not engage in conversation today.  Insight judgment coping remain limited    Review of Systems  Objective         Vitals Last 24 Hours:  TEMPERATURE:  Temp  Avg: 98 F (36.7 C)  Min: 97.9 F (36.6 C)  Max: 98.1 F (36.7 C)  RESPIRATIONS RANGE: Resp  Avg: 17.5  Min: 17  Max: 18  PULSE OXIMETRY RANGE: SpO2  Avg: 99 %  Min: 98 %  Max: 100 %  PULSE RANGE: Pulse  Avg: 70  Min: 69  Max: 71  BLOOD PRESSURE RANGE: Systolic (24hrs), Avg:118 , Min:116 , Max:120   ; Diastolic (24hrs), Avg:77, Min:75, Max:78    I/O (24Hr):  No intake or output data in the 24 hours ending 02/11/23 1234  Objective  Labs/Imaging/Diagnostics    Labs:  CBC:  Recent Labs     02/09/23  1903   WBC 5.7   RBC 4.18   HGB 10.8*   HCT 31.1*   MCV 74.4*   RDW 15.8*   PLT 422*     CHEMISTRIES:  Recent Labs     02/09/23  1903   NA 140   K 4.4   CL 109*   CO2 24   BUN 15   CREATININE 0.79   GLUCOSE 94     PT/INR:No results for input(s): PROTIME, INR in the last 72 hours.  APTT:No results for input(s): APTT in the last 72 hours.  LIVER PROFILE:  Recent Labs     02/09/23  1903   AST 13*   ALT 12   BILIDIR <0.1   BILITOT 0.3   ALKPHOS 73       Imaging Last 24 Hours:  No results found.  Assessment//Plan           Hospital Problems             Last Modified POA    * (Principal) Schizoaffective disorder (HCC) 02/10/2023 Yes     Assessment & Plan  Reviewed medications and vitals  No reported concerns  No acute distress noted    Current Facility-Administered  Medications:     acetaminophen  (TYLENOL ) tablet 650 mg, 650 mg, Oral, Q4H PRN, Aaden Buckman, MD, 650 mg at 02/10/23 2214    hydrOXYzine  HCl (ATARAX ) tablet 50 mg, 50 mg, Oral, TID PRN, Adric Wrede, MD    traZODone  (DESYREL ) tablet 50 mg, 50 mg, Oral, Nightly PRN, Argel Pablo, MD    magnesium  hydroxide (MILK OF MAGNESIA) 400 MG/5ML suspension 30 mL, 30 mL, Oral, Daily PRN, Orvin Netter, MD    aluminum & magnesium  hydroxide-simethicone (MAALOX) 200-200-20 MG/5ML suspension 30 mL, 30 mL, Oral, Q6H PRN, Florenda Watt, MD    docusate sodium  (COLACE) capsule 100 mg, 100 mg, Oral, BID, Jaquavious Mercer, MD, 100 mg at 02/10/23 2105    ondansetron  (ZOFRAN -ODT) disintegrating tablet 4 mg, 4 mg, Oral, Q8H PRN, Delaine Hernandez,  MD    DULoxetine  (CYMBALTA ) extended release capsule 30 mg, 30 mg, Oral, Daily, Deylan Canterbury, MD, 30 mg at 02/11/23 9175    risperiDONE  (RISPERDAL ) tablet 1 mg, 1 mg, Oral, BID, Corynn Solberg, MD, 1 mg at 02/11/23 9175    hydrocortisone  1 % cream, , Topical, BID, Sequeira, Joran, MD, Given at 02/11/23 0825   Electronically signed by Espiridion Code, MD on 02/11/23 at 12:34 PM EDT

## 2023-02-11 NOTE — Group Note (Signed)
 Group Therapy Note    Date: 02/11/2023    Group Start Time: 1545  Group End Time: 1630  Group Topic: Recreational    SSR 2 BH NON ACUTE    Myriam Sober        Group Therapy Note    Facilitated leisure skills group to reinforce positive coping and to manage mood through music, social interaction, group activities and art task       Attendees: 8/11       Patient's Goal:  Client will learn and demonstrate effective coping skills    Notes:  Pt was receptive to listening to music and a song she selected. Interacted with peers and staff. Accepted a journal and leisure skills     Status After Intervention:  Improved    Participation Level: Active Listener and Interactive    Participation Quality: Appropriate      Speech:  normal      Thought Process/Content: Logical      Affective Functioning: Congruent      Mood: calm       Level of consciousness:  Alert      Response to Learning: Progressing to goal      Endings: None Reported    Modes of Intervention: Socialization and Activity      Discipline Responsible: Recreational Therapist      Signature:  Sober Myriam, [address]

## 2023-02-11 NOTE — BH Note (Signed)
 Patient remains on close observation.  Patient has been alert, oriented, cooperative and pleasant.  Flat affect. She asks appropriately for what she wants.  She complained of a stomach  ache and received Tylenol .  She soon thereafter went to sleep.  She has a flat affect but it slightly brightens when she talks to staff but especially when socializing with peers.  Patient has not voiced SI or HI.  Medication compliant.  Continue to assess.

## 2023-02-11 NOTE — Plan of Care (Signed)
 Problem: Discharge Planning  Goal: Discharge to home or other facility with appropriate resources  Outcome: Progressing     Problem: Pain  Goal: Verbalizes/displays adequate comfort level or baseline comfort level  02/11/2023 2025 by Waddell Flint, LPN  Outcome: Progressing  02/11/2023 0943 by Jonothan Fraction, RN  Outcome: Progressing     Problem: Self Harm/Suicidality  Goal: Will have no self-injury during hospital stay  Description: INTERVENTIONS:  1.  Ensure constant observer at bedside with Q15M safety checks  2.  Maintain a safe environment  3.  Secure patient belongings  4.  Ensure family/visitors adhere to safety recommendations  5.  Ensure safety tray has been added to patient's diet order  6.  Every shift and PRN: Re-assess suicidal risk via Frequent Screener    02/11/2023 2025 by Waddell Flint, LPN  Outcome: Progressing  02/11/2023 0943 by Jonothan Fraction, RN  Outcome: Progressing     Problem: Depression  Goal: Will be euthymic at discharge  Description: INTERVENTIONS:  1. Administer medication as ordered  2. Provide emotional support via 1:1 interaction with staff  3. Encourage involvement in milieu/groups/activities  4. Monitor for social isolation  02/11/2023 2025 by Waddell Flint, LPN  Outcome: Progressing  02/11/2023 0943 by Jonothan Fraction, RN  Outcome: Progressing     Problem: Mania  Goal: Will exhibit normal sleep and speech and no impulsivity  Description: INTERVENTIONS:  1. Administer medication as ordered  2. Set limits on impulsive behavior  3. Make attempts to decrease external stimuli as possible  02/11/2023 2025 by Waddell Flint, LPN  Outcome: Progressing  02/11/2023 0943 by Jonothan Fraction, RN  Outcome: Progressing     Problem: Psychosis  Goal: Will report no hallucinations or delusions  Description: INTERVENTIONS:  1. Administer medication as  ordered  2. Assist with reality testing to support increasing orientation  3. Assess if patient's hallucinations or delusions are  encouraging self harm or harm to others and intervene as appropriate  02/11/2023 2025 by Waddell Flint, LPN  Outcome: Progressing  02/11/2023 0943 by Jonothan Fraction, RN  Outcome: Progressing     Problem: Behavior  Goal: Pt/Family maintain appropriate behavior and adhere to behavioral management agreement, if implemented  Description: INTERVENTIONS:  1. Assess patient/family's coping skills and  non-compliant behavior (including use of illegal substances)  2. Notify security of behavior or suspected illegal substances which indicate the need for search of the family and/or belongings  3. Encourage verbalization of thoughts and concerns in a socially appropriate manner  4. Utilize positive, consistent limit setting strategies supporting safety of patient, staff and others  5. Encourage participation in the decision making process about the behavioral management agreement  6. If a visitor's behavior poses a threat to safety call refer to organization policy.  7. Initiate consult with Social Worker, Psychosocial CNS, Spiritual Care as appropriate  02/11/2023 2025 by Waddell Flint, LPN  Outcome: Progressing  02/11/2023 0943 by Jonothan Fraction, RN  Outcome: Progressing     Problem: Anxiety  Goal: Will report anxiety at manageable levels  Description: INTERVENTIONS:  1. Administer medication as ordered  2. Teach and rehearse alternative coping skills  3. Provide emotional support with 1:1 interaction with staff  02/11/2023 2025 by Waddell Flint, LPN  Outcome: Progressing  02/11/2023 0943 by Jonothan Fraction, RN  Outcome: Progressing     Problem: Drug Abuse/Detox  Goal: Will have no detox symptoms and will verbalize plan for changing drug-related behavior  Description: INTERVENTIONS:  1. Administer medication as ordered  2.  Monitor physical status  3. Provide emotional support with 1:1 interaction with staff  4. Encourage  recovery focused treatment   02/11/2023 2025 by Waddell Flint, LPN  Outcome:  Progressing  02/11/2023 0943 by Jonothan Fraction, RN  Outcome: Progressing     Problem: Sleep Disturbance  Goal: Will exhibit normal sleeping pattern  Description: INTERVENTIONS:  1. Administer medication as ordered  2. Decrease environmental stimuli, including noise, as appropriate  3. Discourage social isolation and naps during the day  02/11/2023 2025 by Waddell Flint, LPN  Outcome: Progressing  02/11/2023 0943 by Jonothan Fraction, RN  Outcome: Progressing     Problem: Self Care Deficit  Goal: Return ADL status to a safe level of function  Description: INTERVENTIONS:  1. Administer medication as ordered  2. Assess ADL deficits and provide assistive devices as needed  3. Obtain PT/OT consults as needed  4. Assist and instruct patient to increase activity and self care as tolerated  02/11/2023 2025 by Waddell Flint, LPN  Outcome: Progressing  02/11/2023 0943 by Jonothan Fraction, RN  Outcome: Progressing

## 2023-02-11 NOTE — Progress Notes (Signed)
 Hospitalist Progress Note  [facility]    Subjective:   Daily Progress Note: 02/11/2023 12:46 PM    No new complaints    Current Facility-Administered Medications   Medication Dose Route Frequency    acetaminophen  (TYLENOL ) tablet 650 mg  650 mg Oral Q4H PRN    hydrOXYzine  HCl (ATARAX ) tablet 50 mg  50 mg Oral TID PRN    traZODone  (DESYREL ) tablet 50 mg  50 mg Oral Nightly PRN    magnesium  hydroxide (MILK OF MAGNESIA) 400 MG/5ML suspension 30 mL  30 mL Oral Daily PRN    aluminum & magnesium  hydroxide-simethicone (MAALOX) 200-200-20 MG/5ML suspension 30 mL  30 mL Oral Q6H PRN    docusate sodium  (COLACE) capsule 100 mg  100 mg Oral BID    ondansetron  (ZOFRAN -ODT) disintegrating tablet 4 mg  4 mg Oral Q8H PRN    DULoxetine  (CYMBALTA ) extended release capsule 30 mg  30 mg Oral Daily    risperiDONE  (RISPERDAL ) tablet 1 mg  1 mg Oral BID    hydrocortisone  1 % cream   Topical BID        Review of Systems  Constitutional: negative for fevers, chills, sweats, and fatigue  Eyes: negative for redness and icterus  Ears, nose, mouth, throat, and face: negative for ear drainage, nasal congestion, sore throat, and voice change  Respiratory: negative for cough, wheezing, or dyspnea on exertion  Cardiovascular: negative for chest pain, dyspnea, palpitations, lower extremity edema  Gastrointestinal: negative for nausea, vomiting, diarrhea, and abdominal pain  Genitourinary:negative for frequency, dysuria, and hematuria  Integument/breast: negative for skin lesion(s) and dryness  Hematologic/lymphatic: negative for easy bruising, bleeding, and lymphadenopathy  Musculoskeletal:negative for neck pain, back pain, and muscle weakness  Neurological: negative for headaches, dizziness, and weakness  Behavioral/Psych: negative for anxiety and depression  Allergic/Immunologic: negative for urticaria and angioedema        Objective:     BP 116/78   Pulse 69   Temp 97.9 F (36.6 C) (Oral)   Resp 17   Ht 1.753 m (5' 9)    Wt 73.5 kg (162 lb)   SpO2 98%   BMI 23.92 kg/m         Temp (24hrs), Avg:98 F (36.7 C), Min:97.9 F (36.6 C), Max:98.1 F (36.7 C)      No intake/output data recorded.  No intake/output data recorded.    PHYSICAL EXAM:  Skin: Extremities and face reveal no rashes.   HEENT: Sclerae anicteric. Extra-occular muscles are intact. No oral ulcers. No ENT discharge. The neck is supple.   Cardiovascular: Regular rate and rhythm. No murmurs, gallops, or rubs. PMI nondisplaced.   Respiratory:  Clear breath sounds bilaterally with no wheezes, rales, or rhonchi.   GI: Abdomen nondistended, soft, and nontender. Normal active bowel sounds. No enlargement of the liver or spleen. No masses palpable.   Rectal: Deferred   Musculoskeletal: No pitting edema of the lower legs. Extremities have good range of motion. No costovertebral tenderness.   Neurological:  Patient is alert and oriented. Cranial nerves II-XII grossly intact  Psychiatric: Mood appears appropriate with judgement intact.   Lymphatic: No cervical or supraclavicular adenopathy.    Additional comments:I reviewed the patient's new clinical lab test results.      Data Review    No results found for this or any previous visit (from the past 24 hour(s)).      Assessment/Plan:     Principal Problem:    Schizoaffective disorder (HCC)  Resolved  Problems:    * No resolved hospital problems. *        Care Plan discussed with: Patient/Family    Total time spent with patient: 20 minutes.

## 2023-02-11 NOTE — BH Note (Signed)
 Pt up ad lib on unit pleasant and cooperative with staff and peers. Pt has to be reminded to ambulate with walker and writer educated pt on risks of nonadherence however pt continues to use intermittently. Pt accepted scheduled medications without difficulty but refused scheduled colace stating I don't want that. Pt denied SI/HI/AVH, denied depression and anxiety. Pt denied pain or any physical complaints. AM vital signs WNL. Q 15 min checks continued to ensure pt safety on unit.

## 2023-02-12 MED ORDER — DULOXETINE HCL 30 MG PO CPEP
30 | Freq: Every day | ORAL | Status: DC
Start: 2023-02-12 — End: 2023-02-13

## 2023-02-12 MED ORDER — TRAZODONE HCL 150 MG PO TABS
150 | Freq: Every evening | ORAL | Status: DC
Start: 2023-02-12 — End: 2023-02-13

## 2023-02-12 MED ADMIN — DULoxetine (CYMBALTA) extended release capsule 30 mg: 30 mg | ORAL | @ 12:00:00 | NDC 00904704461

## 2023-02-12 MED ADMIN — risperiDONE (RISPERDAL) tablet 1 mg: 1 mg | ORAL | @ 12:00:00 | NDC 00904736261

## 2023-02-12 MED ADMIN — traZODone (DESYREL) tablet 100 mg: 100 mg | ORAL | @ 01:00:00 | NDC 00904686861

## 2023-02-12 MED ADMIN — risperiDONE (RISPERDAL) tablet 1 mg: 1 mg | ORAL | @ 01:00:00 | NDC 00904736261

## 2023-02-12 MED FILL — HYDROXYZINE HCL 50 MG PO TABS: 50 MG | ORAL | Qty: 1

## 2023-02-12 MED FILL — DOCUSATE SODIUM 100 MG PO CAPS: 100 MG | ORAL | Qty: 1

## 2023-02-12 MED FILL — RISPERIDONE 1 MG PO TABS: 1 MG | ORAL | Qty: 1

## 2023-02-12 MED FILL — TRAZODONE HCL 50 MG PO TABS: 50 MG | ORAL | Qty: 2

## 2023-02-12 MED FILL — DULOXETINE HCL 30 MG PO CPEP: 30 MG | ORAL | Qty: 1

## 2023-02-12 NOTE — Progress Notes (Signed)
 Hospitalist Progress Note  [facility]    Subjective:   Daily Progress Note: 02/12/2023 11:21 AM    No new complaints    Current Facility-Administered Medications   Medication Dose Route Frequency    traZODone  (DESYREL ) tablet 100 mg  100 mg Oral Nightly    acetaminophen  (TYLENOL ) tablet 650 mg  650 mg Oral Q4H PRN    hydrOXYzine  HCl (ATARAX ) tablet 50 mg  50 mg Oral TID PRN    magnesium  hydroxide (MILK OF MAGNESIA) 400 MG/5ML suspension 30 mL  30 mL Oral Daily PRN    aluminum & magnesium  hydroxide-simethicone (MAALOX) 200-200-20 MG/5ML suspension 30 mL  30 mL Oral Q6H PRN    docusate sodium  (COLACE) capsule 100 mg  100 mg Oral BID    ondansetron  (ZOFRAN -ODT) disintegrating tablet 4 mg  4 mg Oral Q8H PRN    DULoxetine  (CYMBALTA ) extended release capsule 30 mg  30 mg Oral Daily    risperiDONE  (RISPERDAL ) tablet 1 mg  1 mg Oral BID    hydrocortisone  1 % cream   Topical BID        Review of Systems  Constitutional: negative for fevers, chills, sweats, and fatigue  Eyes: negative for redness and icterus  Ears, nose, mouth, throat, and face: negative for ear drainage, nasal congestion, sore throat, and voice change  Respiratory: negative for cough, wheezing, or dyspnea on exertion  Cardiovascular: negative for chest pain, dyspnea, palpitations, lower extremity edema  Gastrointestinal: negative for nausea, vomiting, diarrhea, and abdominal pain  Genitourinary:negative for frequency, dysuria, and hematuria  Integument/breast: negative for skin lesion(s) and dryness  Hematologic/lymphatic: negative for easy bruising, bleeding, and lymphadenopathy  Musculoskeletal:negative for neck pain, back pain, and muscle weakness  Neurological: negative for headaches, dizziness, and weakness  Behavioral/Psych: negative for anxiety and depression  Allergic/Immunologic: negative for urticaria and angioedema        Objective:     BP 100/75   Pulse 78   Temp 98.1 F (36.7 C) (Oral)   Resp 17   Ht 1.753 m (5' 9)    Wt 73.5 kg (162 lb)   SpO2 97%   BMI 23.92 kg/m         Temp (24hrs), Avg:97.9 F (36.6 C), Min:97.7 F (36.5 C), Max:98.1 F (36.7 C)      No intake/output data recorded.  No intake/output data recorded.    PHYSICAL EXAM:  Skin: Extremities and face reveal no rashes.   HEENT: Sclerae anicteric. Extra-occular muscles are intact. No oral ulcers. No ENT discharge. The neck is supple.   Cardiovascular: Regular rate and rhythm. No murmurs, gallops, or rubs. PMI nondisplaced.   Respiratory:  Clear breath sounds bilaterally with no wheezes, rales, or rhonchi.   GI: Abdomen nondistended, soft, and nontender. Normal active bowel sounds. No enlargement of the liver or spleen. No masses palpable.   Rectal: Deferred   Musculoskeletal: No pitting edema of the lower legs. Extremities have good range of motion. No costovertebral tenderness.   Neurological:  Patient is alert and oriented. Cranial nerves II-XII grossly intact  Psychiatric: Mood appears appropriate with judgement intact.   Lymphatic: No cervical or supraclavicular adenopathy.    Additional comments:I reviewed the patient's new clinical lab test results.      Data Review    No results found for this or any previous visit (from the past 24 hour(s)).      Assessment/Plan:     Principal Problem:    Schizoaffective disorder (HCC)  Resolved Problems:    *  No resolved hospital problems. *        Care Plan discussed with: Patient/Family    Total time spent with patient: 20 minutes.

## 2023-02-12 NOTE — Behavioral Health Treatment Team (Signed)
 NOTE     Pt had "baby daddy" Guadalupe Dawn drop off pt belongings.  Onalee Hua stating that he was to pick up pts car key.  Pt had 2 keys for car attached together on plastic ring.  Writer unable to undo plastic ring and pt gave permission to cut ring.

## 2023-02-12 NOTE — BH Note (Addendum)
 DAY SHIFT NOTE:    Pt up in day room for meals only this morning and afternoon, otherwise has been in room.  Pt participated in morning group.  When asked about anxiety she states was but she have me something this morning.  Denies depression, SI/HI. AH/VH.  Remains on Q 15 minutes close observation for safety.      Pt participated in evening group and was up in day room socializing with peers and watching TV.    1614 - Pt rating anxiety 6/10.  Requesting medication for anxiety.  PRN Atarax  given.  Pt states that medication is effective with no side effects reported.      1900 - Pt up at nurses station c/o hurting down there when asked for further information pt states that it burns when I pees.  Provider notified and verbally ordered urinalysis.

## 2023-02-12 NOTE — Progress Notes (Signed)
 Progress Note  Date:02/12/2023       Room:241/02  Patient Name:Connie Morgan     Date of Birth:[birth year]     Age:42 y.o.        Subjective    Subjective   She has been having difficulty with sleep at night, denies any si/hi. She sleeps more during day. Encouraged to attend gp therapy. Reviewed stressors and challenges. No si/hi.  Review of Systems  Objective         Vitals Last 24 Hours:  TEMPERATURE:  Temp  Avg: 97.8 F (36.6 C)  Min: 97.5 F (36.4 C)  Max: 98.1 F (36.7 C)  RESPIRATIONS RANGE: Resp  Avg: 16.5  Min: 16  Max: 17  PULSE OXIMETRY RANGE: SpO2  Avg: 97 %  Min: 97 %  Max: 97 %  PULSE RANGE: Pulse  Avg: 89.5  Min: 78  Max: 101  BLOOD PRESSURE RANGE: Systolic (24hrs), Avg:106 , Min:100 , Max:111   ; Diastolic (24hrs), Avg:74, Min:73, Max:75    I/O (24Hr):  No intake or output data in the 24 hours ending 02/12/23 2243  Objective:  Vital signs: (most recent): Blood pressure 111/73, pulse (!) 101, temperature 97.5 F (36.4 C), temperature source Oral, resp. rate 16, height 1.753 m (5' 9), weight 73.5 kg (162 lb), SpO2 97%, unknown if currently breastfeeding.      Labs/Imaging/Diagnostics    Labs:  CBC:No results for input(s): WBC, RBC, HGB, HCT, MCV, RDW, PLT in the last 72 hours.  CHEMISTRIES:No results for input(s): NA, K, CL, CO2, BUN, CREATININE, GLUCOSE, PHOS, MG in the last 72 hours.    Invalid input(s): CA  PT/INR:No results for input(s): PROTIME, INR in the last 72 hours.  APTT:No results for input(s): APTT in the last 72 hours.  LIVER PROFILE:No results for input(s): AST, ALT, BILIDIR, BILITOT, ALKPHOS in the last 72 hours.    Imaging Last 24 Hours:  No results found.  Assessment//Plan           Hospital Problems             Last Modified POA    * (Principal) Schizoaffective disorder (HCC) 02/10/2023 Yes     Assessment & Plan  Tolerating increaed Risperdal   Increased trazodone  to 200 mg po qhs as she states she takes trazodone  200 mg po  outside  Electronically signed by Espiridion Code, MD on 02/12/23 at 10:28 PM EDT

## 2023-02-12 NOTE — Group Note (Signed)
 Group Therapy Note    Date: 02/12/2023    Group Start Time: 1120  Group End Time: 1200  Group Topic: Education Group - Inpatient    [facility] NON ACUTE    Myriam Sober        Group Therapy Note    Facilitated group to introduce the definition of self-esteem and discuss information relating to creating steps to greater self-appreciation and recognizing symptoms of self-defeat     Attendees: 6/11          Patient's Goal:  Client will learn and demonstrate effective coping skills    Notes:  Receptive to information discussed and engaged. Pt was able to recognize symptoms and identified some personal symptoms of self-defeat and was able to share positive ways to boost her self-esteem     Status After Intervention:  Improved    Participation Level: Active Listener and Interactive    Participation Quality: Appropriate, Attentive, and Sharing      Speech:  normal      Thought Process/Content: Logical      Affective Functioning: Congruent      Mood:  broad      Level of consciousness:  Attentive      Response to Learning: Able to verbalize current knowledge/experience and Progressing to goal      Endings: None Reported    Modes of Intervention: Education and Support      Discipline Responsible: Recreational Therapist      Signature:  Sober Myriam, [address]

## 2023-02-12 NOTE — Group Note (Signed)
 Group Therapy Note    Date: 02/12/2023    Group Start Time: 1505  Group End Time: 1545  Group Topic: Recreational    [facility] 2 [facility] NON ACUTE    Myriam Sober        Group Therapy Note    Facilitated leisure skills group to reinforce positive coping and to manage mood through music, social interaction, group activities and art task       Attendees: 7/11       Patient's Goal: Client will learn and demonstrate effective coping skills    Notes:  Pt was receptive to listening to music and songs  she selected while working on leisure task. Interacted with peers and staff    Status After Intervention:  Improved    Participation Level: Active Listener and Interactive    Participation Quality: Appropriate and Attentive      Speech:  normal      Thought Process/Content: Logical      Affective Functioning: Congruent      Mood:  calm      Level of consciousness:  Attentive      Response to Learning: Progressing to goal      Endings: None Reported    Modes of Intervention: Socialization and Activity      Discipline Responsible: Recreational Therapist      Signature:  Sober Myriam, [address]

## 2023-02-12 NOTE — BH Note (Signed)
 Pt noted up on unit walking around in hallways with walker, presents with a flat affect,  denies any  thoughts of any harm to self or others, pt noted on the phone a lot , she had someone to bring her some hard candy, pt is compliant with medications but refused her stool softener, stating that it makes her stomach hurts, pt remain on close observation for safety.

## 2023-02-12 NOTE — Progress Notes (Incomplete)
 Progress Note  Date:02/12/2023       Room:241/02  Patient Name:Connie Morgan     Date of Birth:06-Dec-1980     Age:42 y.o.        Subjective    Subjective     Review of Systems  Objective         Vitals Last 24 Hours:  TEMPERATURE:  Temp  Avg: 97.8 F (36.6 C)  Min: 97.5 F (36.4 C)  Max: 98.1 F (36.7 C)  RESPIRATIONS RANGE: Resp  Avg: 16.5  Min: 16  Max: 17  PULSE OXIMETRY RANGE: SpO2  Avg: 97 %  Min: 97 %  Max: 97 %  PULSE RANGE: Pulse  Avg: 89.5  Min: 78  Max: 101  BLOOD PRESSURE RANGE: Systolic (24hrs), Avg:106 , Min:100 , Max:111   ; Diastolic (24hrs), Avg:74, Min:73, Max:75    I/O (24Hr):  No intake or output data in the 24 hours ending 02/12/23 2228  Objective:  Vital signs: (most recent): Blood pressure 111/73, pulse (!) 101, temperature 97.5 F (36.4 C), temperature source Oral, resp. rate 16, height 1.753 m (5' 9), weight 73.5 kg (162 lb), SpO2 97%, unknown if currently breastfeeding.    Labs/Imaging/Diagnostics    Labs:  CBC:No results for input(s): WBC, RBC, HGB, HCT, MCV, RDW, PLT in the last 72 hours.  CHEMISTRIES:No results for input(s): NA, K, CL, CO2, BUN, CREATININE, GLUCOSE, PHOS, MG in the last 72 hours.    Invalid input(s): CA  PT/INR:No results for input(s): PROTIME, INR in the last 72 hours.  APTT:No results for input(s): APTT in the last 72 hours.  LIVER PROFILE:No results for input(s): AST, ALT, BILIDIR, BILITOT, ALKPHOS in the last 72 hours.    Imaging Last 24 Hours:  No results found.  Assessment//Plan           Hospital Problems             Last Modified POA    * (Principal) Schizoaffective disorder (HCC) 02/10/2023 Yes     Assessment & Plan    Electronically signed by Espiridion Code, MD on 02/12/23 at 10:28 PM EDT

## 2023-02-13 MED ORDER — RISPERIDONE MICROSPHERES ER 25 MG IM SRER
25 | Freq: Once | INTRAMUSCULAR | Status: AC
Start: 2023-02-13 — End: 2023-02-13
  Administered 2023-02-13: 17:00:00 25 mg via INTRAMUSCULAR

## 2023-02-13 MED ORDER — DULOXETINE HCL 60 MG PO CPEP
60 | ORAL_CAPSULE | Freq: Every day | ORAL | 1 refills | Status: AC
Start: 2023-02-13 — End: ?

## 2023-02-13 MED ORDER — HYDROCORTISONE 1 % EX CREA
1 | CUTANEOUS | 1 refills | Status: AC
Start: 2023-02-13 — End: 2023-02-20

## 2023-02-13 MED ORDER — TRAZODONE HCL 100 MG PO TABS
100 | ORAL_TABLET | Freq: Every evening | ORAL | 1 refills | Status: AC
Start: 2023-02-13 — End: ?

## 2023-02-13 MED ORDER — RISPERIDONE 1 MG PO TABS
1 | ORAL_TABLET | Freq: Two times a day (BID) | ORAL | 1 refills | Status: AC
Start: 2023-02-13 — End: ?

## 2023-02-13 MED ADMIN — traZODone (DESYREL) tablet 200 mg: 200 mg | ORAL | @ 01:00:00 | NDC 60687043211

## 2023-02-13 MED ADMIN — risperiDONE (RISPERDAL) tablet 1 mg: 1 mg | ORAL | @ 01:00:00 | NDC 00904736261

## 2023-02-13 MED ADMIN — risperiDONE (RISPERDAL) tablet 1 mg: 1 mg | ORAL | @ 14:00:00 | NDC 00904736261

## 2023-02-13 MED ADMIN — DULoxetine (CYMBALTA) extended release capsule 60 mg: 60 mg | ORAL | @ 14:00:00 | NDC 00904704461

## 2023-02-13 MED FILL — DOCUSATE SODIUM 100 MG PO CAPS: 100 MG | ORAL | Qty: 1

## 2023-02-13 MED FILL — HYDROXYZINE HCL 50 MG PO TABS: 50 MG | ORAL | Qty: 1

## 2023-02-13 MED FILL — RISPERIDONE MICROSPHERES ER 25 MG IM SRER: 25 MG | INTRAMUSCULAR | Qty: 2

## 2023-02-13 MED FILL — TRAZODONE HCL 50 MG PO TABS: 50 MG | ORAL | Qty: 1

## 2023-02-13 MED FILL — RISPERDAL CONSTA 25 MG IM SRER: 25 MG | INTRAMUSCULAR | Qty: 2

## 2023-02-13 MED FILL — RISPERIDONE 1 MG PO TABS: 1 MG | ORAL | Qty: 1

## 2023-02-13 MED FILL — DULOXETINE HCL 30 MG PO CPEP: 30 MG | ORAL | Qty: 2

## 2023-02-13 NOTE — Progress Notes (Signed)
 Hospitalist Progress Note  [facility]    Subjective:   Daily Progress Note: 02/13/2023 2:32 PM    No new complaints    Current Facility-Administered Medications   Medication Dose Route Frequency    traZODone  (DESYREL ) tablet 200 mg  200 mg Oral Nightly    DULoxetine  (CYMBALTA ) extended release capsule 60 mg  60 mg Oral Daily    acetaminophen  (TYLENOL ) tablet 650 mg  650 mg Oral Q4H PRN    hydrOXYzine  HCl (ATARAX ) tablet 50 mg  50 mg Oral TID PRN    magnesium  hydroxide (MILK OF MAGNESIA) 400 MG/5ML suspension 30 mL  30 mL Oral Daily PRN    aluminum & magnesium  hydroxide-simethicone (MAALOX) 200-200-20 MG/5ML suspension 30 mL  30 mL Oral Q6H PRN    docusate sodium  (COLACE) capsule 100 mg  100 mg Oral BID    ondansetron  (ZOFRAN -ODT) disintegrating tablet 4 mg  4 mg Oral Q8H PRN    risperiDONE  (RISPERDAL ) tablet 1 mg  1 mg Oral BID    hydrocortisone  1 % cream   Topical BID        Review of Systems  Constitutional: negative for fevers, chills, sweats, and fatigue  Eyes: negative for redness and icterus  Ears, nose, mouth, throat, and face: negative for ear drainage, nasal congestion, sore throat, and voice change  Respiratory: negative for cough, wheezing, or dyspnea on exertion  Cardiovascular: negative for chest pain, dyspnea, palpitations, lower extremity edema  Gastrointestinal: negative for nausea, vomiting, diarrhea, and abdominal pain  Genitourinary:negative for frequency, dysuria, and hematuria  Integument/breast: negative for skin lesion(s) and dryness  Hematologic/lymphatic: negative for easy bruising, bleeding, and lymphadenopathy  Musculoskeletal:negative for neck pain, back pain, and muscle weakness  Neurological: negative for headaches, dizziness, and weakness  Behavioral/Psych: negative for anxiety and depression  Allergic/Immunologic: negative for urticaria and angioedema        Objective:     BP 98/73   Pulse 82   Temp 97.5 F (36.4 C) (Oral)   Resp 20   Ht 1.753 m (5' 9)    Wt 73.5 kg (162 lb)   SpO2 98%   BMI 23.92 kg/m         Temp (24hrs), Avg:97.5 F (36.4 C), Min:97.5 F (36.4 C), Max:97.5 F (36.4 C)      No intake/output data recorded.  No intake/output data recorded.    PHYSICAL EXAM:  Skin: Extremities and face reveal no rashes.   HEENT: Sclerae anicteric. Extra-occular muscles are intact. No oral ulcers. No ENT discharge. The neck is supple.   Cardiovascular: Regular rate and rhythm. No murmurs, gallops, or rubs. PMI nondisplaced.   Respiratory:  Clear breath sounds bilaterally with no wheezes, rales, or rhonchi.   GI: Abdomen nondistended, soft, and nontender. Normal active bowel sounds. No enlargement of the liver or spleen. No masses palpable.   Rectal: Deferred   Musculoskeletal: No pitting edema of the lower legs. Extremities have good range of motion. No costovertebral tenderness.   Neurological:  Patient is alert and oriented. Cranial nerves II-XII grossly intact  Psychiatric: Mood appears appropriate with judgement intact.   Lymphatic: No cervical or supraclavicular adenopathy.    Additional comments:I reviewed the patient's new clinical lab test results.      Data Review    No results found for this or any previous visit (from the past 24 hour(s)).      Assessment/Plan:     Principal Problem:    Schizoaffective disorder (HCC)  Resolved Problems:    *  No resolved hospital problems. *        Care Plan discussed with: Patient/Family    Total time spent with patient: 20 minutes.

## 2023-02-13 NOTE — Progress Notes (Signed)
 PSYCHIATRIC DISCHARGE SUMMARY         IDENTIFICATION:    Patient Name  Connie Morgan   Date of Birth 01/23/1981   CSN [id number]   Medical Record Number  [id number]      Age  42 y.o.   PCP None, None   Admit date:  02/09/2023    Discharge date: 02/13/2023   Room Number  241/02  @ [facility]   Date of Service  02/13/2023            TYPE OF DISCHARGE: REGULAR               CONDITION AT DISCHARGE: Stable       PROVISIONAL & DISCHARGE DIAGNOSES:      Schizoaffective disorder (HCC),  Depression unspecified type,  Cocaine abuse  MJ abuse   tension headache, bleeding hemorrhoids        CC & HISTORY OF PRESENT ILLNESS:  CC: Schizoaffective disorder, depression unspecified type, tension headache, bleeding hemorroids         HISTORY OF PRESENT ILLNESS:      The patient, Connie Morgan, is a 42 y.o. female  Ms. Minich presented to ER with medical complaints including bleeding hemorrhoids, feeling like food getting stuck when eating, occasional SOB, and lower abdominal pain. Pt stated there were other stuff going on, like mental stuff and was tearful in triage. Pt reported being depressed beginning the day before arriving at the ED. Pt endorsed thoughts of wanting to go to sleep and not wake back up. Denied any intention to self harm. Pt also reported wanting to hurt somebody she deals with.     Per [facility] note on 02/09/2023, patient stated she had been feeling increasingly suicidal over the past two days. Pt expressed feelings of hopelessness and worthlessness. Pt endorsed symptoms of isolation, withdrawal, crying spells, low mood, poor sleep and appetite. Pt reports stressors within her relationship and adult children. She is currently single and lives with her adult children. She has adult children and younger children in the foster care system. She plans to return home upon discharge. Her source of income comes from employment. Pt endorses that when she is not working, she spends all day in her room. Pt endorses  passive SI but also reports that her auditory hallucinations may be command in nature. Stating I'll just be cooking in the kitchen, and then next thing I know, I grab a knife. Pt reports she spends all night pacing and has not eaten much in the past four days.      When seen today, pt is just awakening and her eyes are half open. She has not had anything to eat yet and reports feeling tired. Pt has a walker next to her bed.         PAST PSYCHIATRIC HISTORY:   Per patient, she has history of schizophrenia, bipolar disorder, anxiety, and depression. Pt sees [facility] for medication management and therapy. Pt reports she typically takes Zyprexa, celexa and hydroxyzine . Pt reports she stopped taking her medications approximately two weeks ago due to them making her too drowsy. She also reports she does not find her therapist helpful.       SOCIAL HISTORY:    Social History     Socioeconomic History    Marital status: Single     Spouse name: Not on file    Number of children: Not on file    Years of education: Not on file  Highest education level: Not on file   Occupational History    Not on file   Tobacco Use    Smoking status: Former     Current packs/day: 0.25     Types: Cigarettes    Smokeless tobacco: Never    Tobacco comments:     Quit smoking: 1-2 cigarettes a day   Vaping Use    Vaping status: Never Used   Substance and Sexual Activity    Alcohol use: No    Drug use: No    Sexual activity: Not on file     Comment: Unprotected sex with one person x 1 year.    Other Topics Concern    Not on file   Social History Narrative    Not on file     Social Determinants of Health     Financial Resource Strain: Not on file   Food Insecurity: Food Insecurity Present (02/10/2023)    Hunger Vital Sign     Worried About Running Out of Food in the Last Year: Never true     Ran Out of Food in the Last Year: Sometimes true   Transportation Needs: No Transportation Needs (02/10/2023)    PRAPARE - Product/process Development Scientist (Medical): No     Lack of Transportation (Non-Medical): No   Physical Activity: Not on file   Stress: Not on file   Social Connections: Patient Declined (02/13/2023)    Social Connections [facility])     If for any reason you need help with day-to-day activities such as bathing, preparing meals, shopping, managing finances, etc., do you get the help you need?: Not on file   Intimate Partner Violence: Not on file   Housing Stability: Low Risk  (02/10/2023)    Housing Stability Vital Sign     Unable to Pay for Housing in the Last Year: No     Number of Times Moved in the Last Year: 0     Homeless in the Last Year: No      FAMILY HISTORY:   Family History   Problem Relation Age of Onset    No Known Problems Mother     No Known Problems Father              HOSPITALIZATION COURSE:    Mareli Antunes was admitted to the inpatient psychiatric unit [facility] for acute psychiatric stabilization in regards to symptomatology as described in the HPI above.  While on the unit Kerby Hockley was involved in individual, group, occupational and milieu therapy.  Psychiatric medications were adjusted during this hospitalization. Nena Sar demonstrated a slow, but progressive improvement in overall condition. Much of patient's depression appeared to be related to situational stressors, effects of drugs of abuse, and psychological factors.  Please see individual progress notes for more specific details regarding patient's hospitalization course.     At time of discharge, Makhia Vosler is without significant problems of depression, psychosis, mania. Patient free of suicidal and homicidal ideations and reports many positive predictive factors in terms of not attempting suicide or homicide.Patient with request for discharge today.There are no grounds to seek a TDO.Patient has maximized benefit to be derived from acute inpatient psychiatric treatment.  All members of the treatment team concur with each other in  regards to plans for discharge today per patient's request.  Patient and family are aware and in agreement with discharge and discharge plan.         LABS  AND IMAGAING:    Labs Reviewed   CBC WITH AUTO DIFFERENTIAL - Abnormal; Notable for the following components:       Result Value    Hemoglobin 10.8 (*)     Hematocrit 31.1 (*)     MCV 74.4 (*)     MCH 25.8 (*)     RDW 15.8 (*)     Platelets 422 (*)     MPV 8.7 (*)     All other components within normal limits   BASIC METABOLIC PANEL - Abnormal; Notable for the following components:    Chloride 109 (*)     All other components within normal limits   URINE DRUG SCREEN - Abnormal; Notable for the following components:    Cocaine, Urine Positive (*)     THC, TH-Cannabinol, Urine Positive (*)     All other components within normal limits   HEPATIC FUNCTION PANEL - Abnormal; Notable for the following components:    Albumin 3.1 (*)     Globulin 4.7 (*)     Albumin/Globulin Ratio 0.7 (*)     AST 13 (*)     All other components within normal limits   COVID-19, RAPID   CULTURE, URINE   ETHANOL   URINALYSIS WITH REFLEX TO CULTURE   PREGNANCY, URINE   URINALYSIS   POC PREGNANCY UR-QUAL     No results found for: PHEN, PHENO, PHENY, PTN, VALAC, VALP, CARB2  Admission on 02/09/2023   Component Date Value Ref Range Status    WBC 02/09/2023 5.7  3.6 - 11.0 K/uL Final    RBC 02/09/2023 4.18  3.80 - 5.20 M/uL Final    Hemoglobin 02/09/2023 10.8 (L)  11.5 - 16.0 g/dL Final    Hematocrit 90/94/7975 31.1 (L)  35.0 - 47.0 % Final    MCV 02/09/2023 74.4 (L)  80.0 - 99.0 FL Final    MCH 02/09/2023 25.8 (L)  26.0 - 34.0 PG Final    MCHC 02/09/2023 34.7  30.0 - 36.5 g/dL Final    RDW 90/94/7975 15.8 (H)  11.5 - 14.5 % Final    Platelets 02/09/2023 422 (H)  150 - 400 K/uL Final    MPV 02/09/2023 8.7 (L)  8.9 - 12.9 FL Final    Nucleated RBCs 02/09/2023 0.0  0.0 PER 100 WBC Final    nRBC 02/09/2023 0.00  0.00 - 0.01 K/uL Final    Neutrophils % 02/09/2023 60  32 - 75 % Final     Lymphocytes % 02/09/2023 27  12 - 49 % Final    Monocytes % 02/09/2023 10  5 - 13 % Final    Eosinophils % 02/09/2023 2  0 - 7 % Final    Basophils % 02/09/2023 1  0 - 1 % Final    Immature Granulocytes % 02/09/2023 0  0 - 0.5 % Final    Neutrophils Absolute 02/09/2023 3.4  1.8 - 8.0 K/UL Final    Lymphocytes Absolute 02/09/2023 1.5  0.8 - 3.5 K/UL Final    Monocytes Absolute 02/09/2023 0.5  0.0 - 1.0 K/UL Final    Eosinophils Absolute 02/09/2023 0.1  0.0 - 0.4 K/UL Final    Basophils Absolute 02/09/2023 0.0  0.0 - 0.1 K/UL Final    Immature Granulocytes Absolute 02/09/2023 0.0  0.00 - 0.04 K/UL Final    Differential Type 02/09/2023 AUTOMATED    Final    Sodium 02/09/2023 140  136 - 145 mmol/L Final    Potassium 02/09/2023 4.4  3.5 - 5.1 mmol/L Final    Chloride 02/09/2023 109 (H)  97 - 108 mmol/L Final    CO2 02/09/2023 24  21 - 32 mmol/L Final    Anion Gap 02/09/2023 7  2 - 12 mmol/L Final    Glucose 02/09/2023 94  65 - 100 mg/dL Final    BUN 90/94/7975 15  6 - 20 mg/dL Final    Creatinine 90/94/7975 0.79  0.55 - 1.02 mg/dL Final    BUN/Creatinine Ratio 02/09/2023 19  12 - 20   Final    Est, Glom Filt Rate 02/09/2023 >90  >60 ml/min/1.95m2 Final    Calcium 02/09/2023 8.9  8.5 - 10.1 mg/dL Final    Amphetamine, Urine 02/09/2023 Negative  Negative   Final    Barbiturates, Urine 02/09/2023 Negative  Negative   Final    Benzodiazepines, Urine 02/09/2023 Negative  Negative   Final    Cocaine, Urine 02/09/2023 Positive (A)  Negative   Final    Methadone, Urine 02/09/2023 Negative  Negative   Final    Opiates, Urine 02/09/2023 Negative  Negative   Final    Phencyclidine, Urine 02/09/2023 Negative  Negative   Final    THC, TH-Cannabinol, Urine 02/09/2023 Positive (A)  Negative   Final    Comments: 02/09/2023     Final                    Value:This test is a screen for drugs of abuse in a medical setting only (i.e., they are unconfirmed results and as such must not be used for non-medical purposes, e.g.,employment testing,  legal testing). Due to its inherent nature, false positive (FP) and false negative (FN) results may be obtained. Therefore, if necessary for medical care, recommend confirmation of positive findings by GC/MS.      Ventricular Rate 02/09/2023 96  BPM Final    Atrial Rate 02/09/2023 96  BPM Final    P-R Interval 02/09/2023 140  ms Final    QRS Duration 02/09/2023 90  ms Final    Q-T Interval 02/09/2023 356  ms Final    QTc Calculation (Bazett) 02/09/2023 449  ms Final    P Axis 02/09/2023 75  degrees Final    R Axis 02/09/2023 61  degrees Final    T Axis 02/09/2023 49  degrees Final    Diagnosis 02/09/2023    Final                    Value:Normal sinus rhythm with sinus arrhythmia  Possible Left atrial enlargement  Borderline ECG  No previous ECGs available  Confirmed by JOSEPH MD, MATHEW ([id number]) on 02/09/2023 8:12:27 PM      Source 02/09/2023 Nasopharyngeal    Final    SARS-CoV-2, PCR 02/09/2023 Not Detected  Not Detected   Final    Ethanol Lvl 02/09/2023 <10  <10 mg/dL Final    Total Protein 02/09/2023 7.8  6.4 - 8.2 g/dL Final    Albumin 90/94/7975 3.1 (L)  3.5 - 5.0 g/dL Final    Globulin 90/94/7975 4.7 (H)  2.0 - 4.0 g/dL Final    Albumin/Globulin Ratio 02/09/2023 0.7 (L)  1.1 - 2.2   Final    Total Bilirubin 02/09/2023 0.3  0.2 - 1.0 mg/dL Final    Bilirubin, Direct 02/09/2023 <0.1  0.0 - 0.2 mg/dL Final    Alk Phosphatase 02/09/2023 73  45 - 117 U/L Final    AST 02/09/2023 13 (L)  15 -  37 U/L Final    ALT 02/09/2023 12  12 - 78 U/L Final    Color, UA 02/09/2023 Yellow/Straw    Final    Appearance 02/09/2023 Clear  Clear   Final    Specific Gravity, UA 02/09/2023 1.019  1.003 - 1.030   Final    pH, Urine 02/09/2023 5.0  5.0 - 8.0   Final    Protein, UA 02/09/2023 Negative  Negative mg/dL Final    Glucose, Ur 90/94/7975 Negative  Negative mg/dL Final    Ketones, Urine 02/09/2023 Negative  Negative mg/dL Final    Bilirubin, Urine 02/09/2023 Negative  Negative   Final    Blood, Urine 02/09/2023 Negative  Negative    Final    Urobilinogen, Urine 02/09/2023 0.1  0.1 - 1.0 EU/dL Final    Nitrite, Urine 02/09/2023 Negative  Negative   Final    Leukocyte Esterase, Urine 02/09/2023 Negative  Negative   Final    WBC, UA 02/09/2023 0-4  0 - 4 /hpf Final    RBC, UA 02/09/2023 0-5  0 - 5 /hpf Final    Epithelial Cells, UA 02/09/2023 Few  Few /lpf Final    BACTERIA, URINE 02/09/2023 Negative  Negative /hpf Final    Urine Culture if Indicated 02/09/2023 Culture not indicated by UA result  Culture not indicated by UA result   Final    Pregnancy, Urine 02/09/2023 Negative  Negative   Final     CT ABDOMEN PELVIS W IV CONTRAST Additional Contrast? None    Result Date: 01/18/2023  EXAM: CT ABDOMEN PELVIS W IV CONTRAST INDICATION: abd pain, vomiting COMPARISON: None CONTRAST: 100 mL of Isovue -370. ORAL CONTRAST: None TECHNIQUE: Following intravenous administration of contrast, thin axial images were obtained through the abdomen and pelvis. Coronal and sagittal reconstructions were generated. CT dose reduction was achieved through use of a standardized protocol tailored for this examination and automatic exposure control for dose modulation. FINDINGS: LOWER THORAX: 7 mm pleural-based noncalcified nodule at the left base LIVER: No mass. BILIARY TREE: Gallbladder is within normal limits. CBD is not dilated. SPLEEN: within normal limits. PANCREAS: No mass or ductal dilatation. ADRENALS: Unremarkable. KIDNEYS: No mass, calculus, or hydronephrosis. STOMACH: Unremarkable. SMALL BOWEL: No dilatation or wall thickening. COLON: No dilatation or wall thickening. APPENDIX: Not enlarged PERITONEUM: No ascites or pneumoperitoneum. RETROPERITONEUM: No lymphadenopathy or aortic aneurysm. REPRODUCTIVE ORGANS: Not enlarged URINARY BLADDER: No mass or calculus. BONES: No destructive bone lesion. ABDOMINAL WALL: Ventral hernia containing fat. Small umbilical hernia containing fat ADDITIONAL COMMENTS: N/A     1. No acute abnormality in the abdomen or pelvis 2. 7 mm  pleural-based nodule at the left base Guidelines by the Fleischner society (Radiology 2017 special report) suggest that patients with a low risk for lung cancer who have solid nodule(s) greater than 6 mm and less than or equal to 8 mm in diameter should have follow up in approximately 6 to 12 months and, if no change, consider at 18-24 months.  In patients with a higher risk, such as smokers, follow-up is recommended in 6 to 12 months and, if no change, at 18-24 months.  Patients with a known malignancy are at increased risk for metastasis and should receive a three month follow-up. Electronically signed by Susie Brain                  DISPOSITION:    Patient to f/u with psychiatric and psychotherapy appointments.  FOLLOW-UP CARE:    activity as tolerated  regular diet  Wound Care: none needed.  [facility]  [address] [place], [place], [place] [address]  Phone: [phone]  Call  Pt will follow up with her provider at [facility] for medication management.               PROGNOSIS:    Limited ---- based on nature of patient's pathology/ies and treatment compliance issues.  Prognosis is greatly dependent upon patient's ability to follow up psychiatric/psychotherapy appointments as well as to comply with psychiatric medications as prescribed.            DISCHARGE MEDICATIONS:    Informed consent given for the use of following psychotropic medications:     Medication List        START taking these medications      DULoxetine  60 MG extended release capsule  Commonly known as: CYMBALTA   Take 1 capsule by mouth daily  Start taking on: February 14, 2023     * hydrocortisone  1 % cream  Commonly known as: Ala-Cort   Apply topically 2 times daily.     * hydrocortisone  1 % cream  Apply topically 2 times daily.     risperiDONE  1 MG tablet  Commonly known as: RISPERDAL   Take 1 tablet by mouth 2 times daily     traZODone  100 MG tablet  Commonly known as: DESYREL   Take 1.5 tablets by mouth nightly           *  This list has 2 medication(s) that are the same as other medications prescribed for you. Read the directions carefully, and ask your doctor or other care provider to review them with you.                CONTINUE taking these medications      docusate sodium  100 MG capsule  Commonly known as: Colace  Take 1 capsule by mouth 2 times daily     miSOPROStol  100 MCG tablet  Commonly known as: Cytotec   Take 4 tablets by mouth 4 times daily for 3 days     ondansetron  4 MG disintegrating tablet  Commonly known as: ZOFRAN -ODT  Place 1 tablet under the tongue every 8 hours as needed for Nausea or Vomiting     Prenatal Vitamin 27-0.8 MG Tabs  Take 1 tablet by mouth daily               Where to Get Your Medications        These medications were sent to [id number] [place], [place] - [address] - [address] [phone] - F [phone]  [address], [place] [address]      Phone: [phone]   DULoxetine  60 MG extended release capsule  hydrocortisone  1 % cream  hydrocortisone  1 % cream  risperiDONE  1 MG tablet  traZODone  100 MG tablet              Signed:  Espiridion Code, MD  02/13/2023

## 2023-02-13 NOTE — Plan of Care (Signed)
 Problem: Discharge Planning  Goal: Discharge to home or other facility with appropriate resources  Outcome: Progressing

## 2023-02-13 NOTE — BH Note (Signed)
 Pt. Is up and visible on unit, affect is flat, appetite good, appearance is neat, denies feeling suicidal or depressed,denies hallucinations,pt.has been discharged to home with all personal belongings, discharge instructions for follow up,prescriptions sent to [facility].no concerns voiced,remains on close observation.

## 2023-02-13 NOTE — BH Note (Signed)
 Behavioral Health Transition Record to Provider    Patient Name: Connie Morgan  Date of Birth: 1981-02-28  Medical Record Number: [id number]  Date of Admission: 02/09/2023  Date of Discharge: 02-13-23    Attending Provider: Irven French, MD  Discharging Provider: Dr. Irven  To contact this individual call [phone] and ask the operator to page.  If unavailable, ask to be transferred to [facility] Provider on call.  A Behavioral Health Provider will be available on call 24/7 and during holidays.    Primary Care Provider: None, None    No Known Allergies    Reason for Admission: Pt was admitted to the ED with increase SI thoughts for the past two weeks. Pt sees a psychiatrist at [facility] for medication management but stopped taking her medications due to them making her drowsy per bsmart note. Pt reported Pt reported feeling of haplessness, not being able to sleep, decreased appetite and isolation.      Admission Diagnosis: Tension headache [G44.209]  Bleeding hemorrhoids [K64.9]  Schizoaffective disorder (HCC) [F25.9]  Depression, unspecified depression type [F32.A]    * No surgery found *    Results for orders placed or performed during the hospital encounter of 02/09/23   COVID-19, Rapid    Specimen: Nasopharyngeal   Result Value Ref Range    Source Nasopharyngeal      SARS-CoV-2, PCR Not Detected Not Detected     CBC with Auto Differential   Result Value Ref Range    WBC 5.7 3.6 - 11.0 K/uL    RBC 4.18 3.80 - 5.20 M/uL    Hemoglobin 10.8 (L) 11.5 - 16.0 g/dL    Hematocrit 68.8 (L) 35.0 - 47.0 %    MCV 74.4 (L) 80.0 - 99.0 FL    MCH 25.8 (L) 26.0 - 34.0 PG    MCHC 34.7 30.0 - 36.5 g/dL    RDW 84.1 (H) 88.4 - 14.5 %    Platelets 422 (H) 150 - 400 K/uL    MPV 8.7 (L) 8.9 - 12.9 FL    Nucleated RBCs 0.0 0.0 PER 100 WBC    nRBC 0.00 0.00 - 0.01 K/uL    Neutrophils % 60 32 - 75 %    Lymphocytes % 27 12 - 49 %    Monocytes % 10 5 - 13 %    Eosinophils % 2 0 - 7 %    Basophils % 1 0 - 1 %    Immature Granulocytes % 0 0 -  0.5 %    Neutrophils Absolute 3.4 1.8 - 8.0 K/UL    Lymphocytes Absolute 1.5 0.8 - 3.5 K/UL    Monocytes Absolute 0.5 0.0 - 1.0 K/UL    Eosinophils Absolute 0.1 0.0 - 0.4 K/UL    Basophils Absolute 0.0 0.0 - 0.1 K/UL    Immature Granulocytes Absolute 0.0 0.00 - 0.04 K/UL    Differential Type AUTOMATED     Basic Metabolic Panel   Result Value Ref Range    Sodium 140 136 - 145 mmol/L    Potassium 4.4 3.5 - 5.1 mmol/L    Chloride 109 (H) 97 - 108 mmol/L    CO2 24 21 - 32 mmol/L    Anion Gap 7 2 - 12 mmol/L    Glucose 94 65 - 100 mg/dL    BUN 15 6 - 20 mg/dL    Creatinine 9.20 9.44 - 1.02 mg/dL    BUN/Creatinine Ratio 19 12 - 20  Est, Glom Filt Rate >90 >60 ml/min/1.34m2    Calcium 8.9 8.5 - 10.1 mg/dL   Urine Drug Screen   Result Value Ref Range    Amphetamine, Urine Negative Negative      Barbiturates, Urine Negative Negative      Benzodiazepines, Urine Negative Negative      Cocaine, Urine Positive (A) Negative      Methadone, Urine Negative Negative      Opiates, Urine Negative Negative      Phencyclidine, Urine Negative Negative      THC, TH-Cannabinol, Urine Positive (A) Negative      Comments:        This test is a screen for drugs of abuse in a medical setting only (i.e., they are unconfirmed results and as such must not be used for non-medical purposes, e.g.,employment testing, legal testing). Due to its inherent nature, false positive (FP) and false negative (FN) results may be obtained. Therefore, if necessary for medical care, recommend confirmation of positive findings by GC/MS.     Ethanol   Result Value Ref Range    Ethanol Lvl <10 <10 mg/dL   Hepatic Function Panel   Result Value Ref Range    Total Protein 7.8 6.4 - 8.2 g/dL    Albumin 3.1 (L) 3.5 - 5.0 g/dL    Globulin 4.7 (H) 2.0 - 4.0 g/dL    Albumin/Globulin Ratio 0.7 (L) 1.1 - 2.2      Total Bilirubin 0.3 0.2 - 1.0 mg/dL    Bilirubin, Direct <9.8 0.0 - 0.2 mg/dL    Alk Phosphatase 73 45 - 117 U/L    AST 13 (L) 15 - 37 U/L    ALT 12 12 - 78 U/L    Urinalysis with Reflex to Culture    Specimen: Urine   Result Value Ref Range    Color, UA Yellow/Straw      Appearance Clear Clear      Specific Gravity, UA 1.019 1.003 - 1.030      pH, Urine 5.0 5.0 - 8.0      Protein, UA Negative Negative mg/dL    Glucose, Ur Negative Negative mg/dL    Ketones, Urine Negative Negative mg/dL    Bilirubin, Urine Negative Negative      Blood, Urine Negative Negative      Urobilinogen, Urine 0.1 0.1 - 1.0 EU/dL    Nitrite, Urine Negative Negative      Leukocyte Esterase, Urine Negative Negative      WBC, UA 0-4 0 - 4 /hpf    RBC, UA 0-5 0 - 5 /hpf    Epithelial Cells, UA Few Few /lpf    BACTERIA, URINE Negative Negative /hpf    Urine Culture if Indicated Culture not indicated by UA result Culture not indicated by UA result     Pregnancy, Urine   Result Value Ref Range    Pregnancy, Urine Negative Negative     EKG 12 Lead "IF" over 43 years of age, and Upper Abd pain, SOB, Diaphoresis, Diabetes, or Tachycardia greater than or equal to 120 bmp. (Use as indication for order).   Result Value Ref Range    Ventricular Rate 96 BPM    Atrial Rate 96 BPM    P-R Interval 140 ms    QRS Duration 90 ms    Q-T Interval 356 ms    QTc Calculation (Bazett) 449 ms    P Axis 75 degrees    R Axis 61 degrees  T Axis 49 degrees    Diagnosis       Normal sinus rhythm with sinus arrhythmia  Possible Left atrial enlargement  Borderline ECG  No previous ECGs available  Confirmed by JOSEPH MD, MATHEW ([id number]) on 02/09/2023 8:12:27 PM         Immunizations administered during this encounter:   There is no immunization history on file for this patient.    Screening for Metabolic Disorders for Patients on Antipsychotic Medications  (Data obtained from the EMR)    Estimated Body Mass Index  Estimated body mass index is 23.92 kg/m as calculated from the following:    Height as of this encounter: 1.753 m (5' 9).    Weight as of this encounter: 73.5 kg (162 lb).     Vital Signs/Blood Pressure  BP 98/73   Pulse 82    Temp 97.5 F (36.4 C) (Oral)   Resp 20   Ht 1.753 m (5' 9)   Wt 73.5 kg (162 lb)   SpO2 98%   BMI 23.92 kg/m     Blood Glucose/Hemoglobin A1c  No results found for: GLU, GLUCPOC    No results found for: HBA1C     Lipid Panel  No results found for: CHOL, CHLST, CHOLV, 115730, HDL, HDLC, LDL     Discharge Diagnosis: Tension headache [G44.209]  Bleeding hemorrhoids [K64.9]  Schizoaffective disorder (HCC) [F25.9]  Depression, unspecified depression type [F32.A]    Discharge Plan: Pt will be picked up by family and return home. Pt will take the medications as directed by the doctor. She will continue with [facility] outpatient services.     Discharge Medication List and Instructions:      Medication List        START taking these medications      DULoxetine  60 MG extended release capsule  Commonly known as: CYMBALTA   Take 1 capsule by mouth daily  Start taking on: February 14, 2023     * hydrocortisone  1 % cream  Commonly known as: Ala-Cort   Apply topically 2 times daily.     * hydrocortisone  1 % cream  Apply topically 2 times daily.     risperiDONE  1 MG tablet  Commonly known as: RISPERDAL   Take 1 tablet by mouth 2 times daily     traZODone  100 MG tablet  Commonly known as: DESYREL   Take 1.5 tablets by mouth nightly           * This list has 2 medication(s) that are the same as other medications prescribed for you. Read the directions carefully, and ask your doctor or other care provider to review them with you.                CONTINUE taking these medications      docusate sodium  100 MG capsule  Commonly known as: Colace  Take 1 capsule by mouth 2 times daily     miSOPROStol  100 MCG tablet  Commonly known as: Cytotec   Take 4 tablets by mouth 4 times daily for 3 days     ondansetron  4 MG disintegrating tablet  Commonly known as: ZOFRAN -ODT  Place 1 tablet under the tongue every 8 hours as needed for Nausea or Vomiting     Prenatal Vitamin 27-0.8 MG Tabs  Take 1 tablet by mouth daily                Where to Get Your Medications        These medications were sent  to [facility] [address] [place], [place] - [address] - P [phone] - F [phone]  [address], [place] [address]      Phone: [phone]   DULoxetine  60 MG extended release capsule  hydrocortisone  1 % cream  hydrocortisone  1 % cream  risperiDONE  1 MG tablet  traZODone  100 MG tablet           To obtain results of studies pending at discharge, please contact [phone]    Follow-up Information       Follow up With Specialties Details Why Contact Info    [facility]  Call Pt will follow up with her provider at [facility] for medication management. [address] [place], [place] [address]  Phone: [phone]            Advanced Directive:   Does the patient have an appointed surrogate decision maker? No  Does the patient have a Medical Advance Directive? No  Does the patient have a Psychiatric Advance Directive? No  If the patient does not have a surrogate or Medical Advance Directive AND Psychiatric Advance Directive, the patient was offered information on these advance directives Not applicable    Patient Instructions: Please continue all medications until otherwise directed by physician.      Tobacco Cessation Discharge Plan:   Is the patient a smoker and needs referral for smoking cessation? Yes  Patient referred to the following for smoking cessation with an appointment? Refused    Patient was offered medication to assist with smoking cessation at discharge? Yes  Was education for smoking cessation added to the discharge instructions? Yes    Alcohol/Substance Abuse Discharge Plan:   Does the patient have a history of substance/alcohol abuse and requires a referral for treatment? Yes  Patient referred to the following for substance/alcohol abuse treatment with an appointment? Refused  Patient was offered medication to assist with alcohol cessation at discharge? Not applicable  Was education for substance/alcohol  abuse added to discharge instructions? Yes    Patient discharged to Home/Self Care. Discharge information provided to the patient/caregiver either in hard copy or electronically.

## 2023-02-13 NOTE — Group Note (Signed)
 Group Therapy Note    Date: 02/13/2023    Group Start Time: 1315  Group End Time: 1330  Group Topic: Process Group - Inpatient    [facility]    Connie Morgan        Group Therapy Note  Due to the acuity of the unit writer went to each pts room to speak with them about self care. Many of the pts were paranoid and unable to participate/communicate with the clinical research associate. Writer provided them with a self care information sheet  Attendees: 0-11           Notes:  Pt was pre-occupied with being discharged     Discipline Responsible: Social Worker/Counselor      Signature:  Heinz List

## 2023-02-13 NOTE — BH Note (Signed)
 DISCHARGE SUMMARY    NAME:Connie Morgan  DOB: 1981/04/29  MRN: [id number]    The patient Connie Morgan exhibits the ability to control behavior in a less restrictive environment.  Patient's level of functioning is improving.  No assaultive/destructive behavior has been observed for the past 24 hours.  No suicidal/homicidal threat or behavior has been observed for the past 24 hours.  There is no evidence of serious medication side effects.  Patient has not been in physical or protective restraints for at least the past 24 hours.    If weapons involved, how are they secured? N/a    Is patient aware of and in agreement with discharge plan? yes    Arrangements for medication:  Prescriptions will be sent to the pharmacy on file    Copy of discharge instructions to provider?:  yes    Arrangements for transportation home:  pt will be picked up by family    Keep all follow up appointments as scheduled, continue to take prescribed medications per physician instructions.  Mental health crisis number:  911 or your local mental health crisis line number at (318)[phone]      Mental Health Emergency WARM LINE      1-866-400-MHAV [phone])      M-F: 9am to 9pm      Sat & Sun: 5pm - 9pm  National suicide prevention lines:                             1-800-SUICIDE 272-430-4936)       1-800-273-TALK 803-682-0523)   24/7 Crisis Text Line:  Text HOME to [phone]

## 2023-05-12 DIAGNOSIS — Z23 Encounter for immunization: Secondary | ICD-10-CM

## 2023-05-12 DIAGNOSIS — Z203 Contact with and (suspected) exposure to rabies: Secondary | ICD-10-CM

## 2023-05-12 NOTE — ED Provider Notes (Signed)
 [facility] EMERGENCY DEPT  EMERGENCY DEPARTMENT HISTORY AND PHYSICAL EXAM      Date: 05/12/2023  Patient Name: Connie Morgan  MRN: [id number]  Birthdate: 01-Sep-1980  Date of evaluation: 05/12/2023  Provider: Timoteo Ace, PA-C   Note Started: 10:25 PM EST 12/6/2

## 2023-05-12 NOTE — ED Triage Notes (Signed)
 Pt here for rabies shot after "being attacked by cats"

## 2023-05-13 ENCOUNTER — Inpatient Hospital Stay: Admit: 2023-05-13 | Discharge: 2023-05-13 | Disposition: A | Discharge status: Home / Self Care | Payer: MEDICARE

## 2023-05-13 MED ORDER — RABIES VACCINE, PCEC IM SUSR
Freq: Once | INTRAMUSCULAR | Status: AC
Start: 2023-05-13 — End: 2023-05-12
  Administered 2023-05-13: 04:00:00 1 mL via INTRAMUSCULAR

## 2023-05-13 MED ORDER — KETOCONAZOLE 2 % EX CREA
2 | CUTANEOUS | 0 refills | Status: AC
Start: 2023-05-13 — End: ?

## 2023-05-13 MED FILL — RABAVERT IM SUSR: INTRAMUSCULAR | Qty: 1 | Fill #0

## 2023-05-19 DIAGNOSIS — Z23 Encounter for immunization: Secondary | ICD-10-CM

## 2023-05-19 DIAGNOSIS — Z203 Contact with and (suspected) exposure to rabies: Secondary | ICD-10-CM

## 2023-05-19 NOTE — Discharge Instructions (Signed)
 PRIMARY CARE in [facility]       [facility]:  [address], [place], [place] [address].  [phone]  Phylliss Blakes, MD Family Medicine  Linnell Fulling, MD Family Medicine  Angelene Giovanni, MD Family Medicine    [facility]

## 2023-05-19 NOTE — ED Provider Notes (Signed)
 [facility]ER[facility]Y [facility]PT  EMERGENCY DEPARTMENT HISTORY AND PHYSICAL EXAM      Date: 05/19/2023  Patient Name: Connie Morgan  MRN: [id number]  Birthdate: 1980-11-27  Date of evaluation: 05/19/2023  Provider: Dallie Piles, PA-C   Note Started: 10:15 PM EST 12/13

## 2023-05-19 NOTE — ED Triage Notes (Signed)
 Here for last rabies vaccination from a cat bite.

## 2023-05-20 ENCOUNTER — Inpatient Hospital Stay: Admit: 2023-05-20 | Discharge: 2023-05-20 | Disposition: A | Discharge status: Home / Self Care | Payer: MEDICARE

## 2023-05-20 MED ORDER — RABIES VACCINE, PCEC IM SUSR
Freq: Once | INTRAMUSCULAR | Status: AC
Start: 2023-05-20 — End: 2023-05-19
  Administered 2023-05-20: 04:00:00 1 mL via INTRAMUSCULAR

## 2023-05-20 MED FILL — RABAVERT IM SUSR: INTRAMUSCULAR | Qty: 1

## 2023-06-01 ENCOUNTER — Inpatient Hospital Stay
Admit: 2023-06-01 | Discharge: 2023-06-02 | Disposition: A | Discharge status: Home / Self Care | Payer: MEDICARE | Attending: Emergency Medicine | Admitting: Emergency Medicine

## 2023-06-01 DIAGNOSIS — J101 Influenza due to other identified influenza virus with other respiratory manifestations: Secondary | ICD-10-CM

## 2023-06-01 NOTE — ED Triage Notes (Signed)
 Pt staes she was asleep all day and she woke up vomiting. Pt states she is coughing and congested. Pt also states she is hot.

## 2023-06-01 NOTE — Discharge Instructions (Addendum)
 Thank you for choosing our Emergency Department for your care.  It is our privilege to care for you in your time of need.  In the next several days, you may receive a survey via email or mailed to your home about your experience with our team.  We woul

## 2023-06-01 NOTE — ED Provider Notes (Signed)
 [facility]  EMERGENCY DEPARTMENT HISTORY AND PHYSICAL EXAM      Date: 06/01/2023  Patient Name: Connie Morgan  MRN: [id number]  Birthdate [birth year]  Date of evaluation: 06/01/2023  Provider: Lorn Junes, DO   Note Started: 7:52 PM EST 06/01/23

## 2023-06-02 LAB — COVID-19 & INFLUENZA COMBO
Rapid Influenza A By PCR: DETECTED — AB
Rapid Influenza B By PCR: NOT DETECTED
SARS-CoV-2, PCR: NOT DETECTED

## 2023-06-02 MED ORDER — AZITHROMYCIN 250 MG PO TABS
250 | PACK | ORAL | 0 refills | Status: AC
Start: 2023-06-02 — End: ?

## 2023-06-02 MED ORDER — OSELTAMIVIR PHOSPHATE 75 MG PO CAPS
75 | ORAL_CAPSULE | Freq: Two times a day (BID) | ORAL | 0 refills | Status: AC
Start: 2023-06-02 — End: 2023-06-06

## 2023-06-02 MED ORDER — IBUPROFEN 200 MG PO TABS
200 | ORAL | Status: AC
Start: 2023-06-02 — End: 2023-06-01
  Administered 2023-06-02: 01:00:00 600 mg via ORAL

## 2023-06-02 MED ORDER — ACETAMINOPHEN 500 MG PO TABS
500 | ORAL | Status: AC
Start: 2023-06-02 — End: 2023-06-01
  Administered 2023-06-02: 01:00:00 1000 mg via ORAL

## 2023-06-02 MED ORDER — ACETAMINOPHEN 500 MG PO TABS
500 | ORAL_TABLET | Freq: Four times a day (QID) | ORAL | 0 refills | Status: AC | PRN
Start: 2023-06-02 — End: 2023-06-08

## 2023-06-02 MED ORDER — AMOXICILLIN 500 MG PO CAPS
500 | ORAL_CAPSULE | Freq: Two times a day (BID) | ORAL | 0 refills | Status: AC
Start: 2023-06-02 — End: 2023-06-06

## 2023-06-02 MED ORDER — ONDANSETRON 4 MG PO TBDP
4 | ORAL_TABLET | Freq: Three times a day (TID) | ORAL | 0 refills | Status: AC | PRN
Start: 2023-06-02 — End: 2023-06-05

## 2023-06-02 MED ORDER — ONDANSETRON 4 MG PO TBDP
4 | ORAL | Status: AC
Start: 2023-06-02 — End: 2023-06-01
  Administered 2023-06-02: 01:00:00 4 mg via ORAL

## 2023-06-02 MED ORDER — IBUPROFEN 600 MG PO TABS
600 | ORAL_TABLET | Freq: Four times a day (QID) | ORAL | 0 refills | Status: DC | PRN
Start: 2023-06-02 — End: 2024-04-10

## 2023-06-02 MED FILL — ACETAMINOPHEN EXTRA STRENGTH 500 MG PO TABS: 500 MG | ORAL | Qty: 2

## 2023-06-02 MED FILL — ONDANSETRON 4 MG PO TBDP: 4 MG | ORAL | Qty: 1

## 2023-06-02 MED FILL — IBUPROFEN 200 MG PO TABS: 200 MG | ORAL | Qty: 3

## 2023-06-03 ENCOUNTER — Inpatient Hospital Stay
Admit: 2023-06-03 | Discharge: 2023-06-03 | Disposition: A | Discharge status: Home / Self Care | Payer: MEDICARE | Attending: Student in an Organized Health Care Education/Training Program

## 2023-06-03 DIAGNOSIS — J111 Influenza due to unidentified influenza virus with other respiratory manifestations: Secondary | ICD-10-CM

## 2023-06-03 LAB — COMPREHENSIVE METABOLIC PANEL
ALT: 62 U/L (ref 12–78)
AST: 62 U/L — ABNORMAL HIGH (ref 15–37)
Albumin/Globulin Ratio: 0.7 — ABNORMAL LOW (ref 1.1–2.2)
Albumin: 3.5 g/dL (ref 3.5–5.0)
Alk Phosphatase: 65 U/L (ref 45–117)
Anion Gap: 7 mmol/L (ref 2–12)
BUN/Creatinine Ratio: 11 — ABNORMAL LOW (ref 12–20)
BUN: 10 mg/dL (ref 6–20)
CO2: 23 mmol/L (ref 21–32)
Calcium: 8.7 mg/dL (ref 8.5–10.1)
Chloride: 107 mmol/L (ref 97–108)
Creatinine: 0.91 mg/dL (ref 0.55–1.02)
Est, Glom Filt Rate: 80 mL/min/{1.73_m2} (ref >60)
Globulin: 4.7 g/dL — ABNORMAL HIGH (ref 2.0–4.0)
Glucose: 110 mg/dL — ABNORMAL HIGH (ref 65–100)
Potassium: 3.4 mmol/L — ABNORMAL LOW (ref 3.5–5.1)
Sodium: 137 mmol/L (ref 136–145)
Total Bilirubin: 0.1 mg/dL — ABNORMAL LOW (ref 0.2–1.0)
Total Protein: 8.2 g/dL (ref 6.4–8.2)

## 2023-06-03 LAB — CBC WITH AUTO DIFFERENTIAL
Basophils %: 1 % (ref 0–1)
Basophils Absolute: 0 10*3/uL (ref 0.0–0.1)
Eosinophils %: 0 % (ref 0–7)
Eosinophils Absolute: 0 10*3/uL (ref 0.0–0.4)
Hematocrit: 36.2 % (ref 35.0–47.0)
Hemoglobin: 12.2 g/dL (ref 11.5–16.0)
Immature Granulocytes %: 0 % (ref 0–0.5)
Immature Granulocytes Absolute: 0 10*3/uL (ref 0.00–0.04)
Lymphocytes %: 28 % (ref 12–49)
Lymphocytes Absolute: 0.7 10*3/uL — ABNORMAL LOW (ref 0.8–3.5)
MCH: 25.7 pg — ABNORMAL LOW (ref 26.0–34.0)
MCHC: 33.7 g/dL (ref 30.0–36.5)
MCV: 76.4 fL — ABNORMAL LOW (ref 80.0–99.0)
MPV: 9.2 fL (ref 8.9–12.9)
Monocytes %: 20 % — ABNORMAL HIGH (ref 5–13)
Monocytes Absolute: 0.5 10*3/uL (ref 0.0–1.0)
Neutrophils %: 51 % (ref 32–75)
Neutrophils Absolute: 1.2 10*3/uL — ABNORMAL LOW (ref 1.8–8.0)
Nucleated RBCs: 0.8 /100{WBCs} — ABNORMAL HIGH
Platelets: 353 10*3/uL (ref 150–400)
RBC: 4.74 M/uL (ref 3.80–5.20)
RDW: 15.7 % — ABNORMAL HIGH (ref 11.5–14.5)
WBC: 2.4 10*3/uL — ABNORMAL LOW (ref 3.6–11.0)
nRBC: 0.02 10*3/uL — ABNORMAL HIGH (ref 0.00–0.01)

## 2023-06-03 MED ORDER — KETOROLAC TROMETHAMINE 30 MG/ML IJ SOLN
30 | INTRAMUSCULAR | Status: AC
Start: 2023-06-03 — End: 2023-06-03
  Administered 2023-06-03: 20:00:00 30 mg via INTRAVENOUS

## 2023-06-03 MED ORDER — ONDANSETRON HCL 4 MG/2ML IJ SOLN
4 | Freq: Once | INTRAMUSCULAR | Status: AC
Start: 2023-06-03 — End: 2023-06-03
  Administered 2023-06-03: 20:00:00 4 mg via INTRAVENOUS

## 2023-06-03 MED ORDER — ACETAMINOPHEN 500 MG PO TABS
500 | ORAL | Status: AC
Start: 2023-06-03 — End: 2023-06-03
  Administered 2023-06-03: 20:00:00 1000 mg via ORAL

## 2023-06-03 MED FILL — KETOROLAC TROMETHAMINE 30 MG/ML IJ SOLN: 30 MG/ML | INTRAMUSCULAR | Qty: 1

## 2023-06-03 MED FILL — ONDANSETRON HCL 4 MG/2ML IJ SOLN: 4 MG/2ML | INTRAMUSCULAR | Qty: 2

## 2023-06-03 MED FILL — ACETAMINOPHEN EXTRA STRENGTH 500 MG PO TABS: 500 MG | ORAL | Qty: 2

## 2023-06-03 NOTE — ED Triage Notes (Signed)
 Pt complaint of persistent F/V/N with gen fatigue. Pt reports dx of Flu with pneumonia earlier this week.

## 2023-06-03 NOTE — Discharge Instructions (Signed)
 Thank you for choosing our Emergency Department for your care.  It is our privilege to care for you in your time of need.  In the next several days, you may receive a survey via email or mailed to your home about your experience with our team.  We woul

## 2023-06-03 NOTE — ED Provider Notes (Signed)
 [facility]  EMERGENCY DEPARTMENT HISTORY AND PHYSICAL EXAM      Date: 06/03/2023  Patient Name: Connie Morgan  MRN: [id number]  Birthdate Jan 20, 1981  Date of evaluation: 06/03/2023  Provider: Lianne Moris, MD   Note Started: 4:03 PM EST 12/

## 2023-06-03 NOTE — ED Notes (Signed)
 Pt leaves ambulatory with discharge papers in hand

## 2023-07-17 ENCOUNTER — Emergency Department: Admit: 2023-07-18 | Payer: MEDICARE

## 2023-07-17 DIAGNOSIS — R0789 Other chest pain: Secondary | ICD-10-CM

## 2023-07-17 DIAGNOSIS — U071 COVID-19: Secondary | ICD-10-CM

## 2023-07-17 NOTE — ED Provider Notes (Signed)
 [facility]  EMERGENCY DEPARTMENT HISTORY AND PHYSICAL EXAM      Date: 07/17/2023  Patient Name: Connie Morgan  MRN: [id number]  Birthdate 08-20-1980  Date of evaluation: 07/17/2023  Provider: Forde Radon, MD   Note Started: 2:35 AM EST 07/18/23    HISTORY OF PRESENT ILLNESS     Chief Complaint   Patient presents with    Chest Pain       History Provided By: Patient    HPI: Connie Morgan is a 43 y.o. female patient presents with generalized bodyaches some chest pain some tingly feelings on her extremities.  Diagnosed at [facility] with COVID yesterday.    PAST MEDICAL HISTORY   Past Medical History:  Past Medical History:   Diagnosis Date    Asthma     Bleeding hemorrhoids     Depression        Past Surgical History:  No past surgical history on file.    Family History:  Family History   Problem Relation Age of Onset    No Known Problems Mother     No Known Problems Father        Social History:  Social History     Tobacco Use    Smoking status: Former     Current packs/day: 0.25     Types: Cigarettes    Smokeless tobacco: Never    Tobacco comments:     Quit smoking: 1-2 cigarettes a day   Vaping Use    Vaping status: Never Used   Substance Use Topics    Alcohol use: No    Drug use: No       Allergies:  No Known Allergies    PCP: None, None    Current Meds:   Current Facility-Administered Medications   Medication Dose Route Frequency Provider Last Rate Last Admin    sodium chloride 0.9 % bolus 1,000 mL  1,000 mL IntraVENous Once Forde Radon, MD 1,935.5 mL/hr at 07/18/23 0211 1,000 mL at 07/18/23 0211     Current Outpatient Medications   Medication Sig Dispense Refill    ibuprofen (IBU) 600 MG tablet Take 1 tablet by mouth every 6 hours as needed for Pain 28 tablet 0    acetaminophen (TYLENOL) 500 MG tablet Take 1 tablet by mouth every 6 hours as needed for Pain 20 tablet 0    azithromycin (ZITHROMAX Z-PAK) 250 MG tablet Take 2 tablets (500 mg) on Day 1, and then take 1 tablet (250 mg) on days 2 through 5. 1  packet 0    ketoconazole (NIZORAL) 2 % cream Apply topically daily. 30 g 0    DULoxetine (CYMBALTA) 60 MG extended release capsule Take 1 capsule by mouth daily 30 capsule 1    traZODone (DESYREL) 100 MG tablet Take 1.5 tablets by mouth nightly 30 tablet 1    risperiDONE (RISPERDAL) 1 MG tablet Take 1 tablet by mouth 2 times daily 60 tablet 1    hydrocortisone (ALA-CORT) 1 % cream Apply topically 2 times daily. 30 g 1    ondansetron (ZOFRAN-ODT) 4 MG disintegrating tablet Place 1 tablet under the tongue every 8 hours as needed for Nausea or Vomiting 20 tablet 0    miSOPROStol (CYTOTEC) 100 MCG tablet Take 4 tablets by mouth 4 times daily for 3 days 48 tablet 0    Prenatal Vit-Fe Fumarate-FA (PRENATAL VITAMIN) 27-0.8 MG TABS Take 1 tablet by mouth daily 30 tablet 0    docusate sodium (COLACE) 100 MG  capsule Take 1 capsule by mouth 2 times daily 30 capsule 0       Social Determinants of Health:   Social Determinants of Health     Tobacco Use: Medium Risk (06/03/2023)    Patient History     Smoking Tobacco Use: Former     Smokeless Tobacco Use: Never     Passive Exposure: Not on file   Alcohol Use: Not At Risk (07/17/2023)    AUDIT-C     Frequency of Alcohol Consumption: Never     Average Number of Drinks: Patient does not drink     Frequency of Binge Drinking: Never   Financial Resource Strain: Not on file   Food Insecurity: Food Insecurity Present (02/10/2023)    Hunger Vital Sign     Worried About Radiation protection practitioner of Food in the Last Year: Never true     Ran Out of Food in the Last Year: Sometimes true   Transportation Needs: No Transportation Needs (02/10/2023)    PRAPARE - Therapist, art (Medical): No     Lack of Transportation (Non-Medical): No   Physical Activity: Not on file   Stress: Not on file   Social Connections: Not on file   Intimate Partner Violence: Not on file   Depression: Not on file   Housing Stability: Low Risk  (02/10/2023)    Housing Stability Vital Sign     Unable to Pay for  Housing in the Last Year: No     Number of Times Moved in the Last Year: 0     Homeless in the Last Year: No   Interpersonal Safety: Not At Risk (07/17/2023)    Interpersonal Safety Domain Source: IP Abuse Screening     Physical abuse: Denies     Verbal abuse: Denies     Emotional abuse: Denies     Financial abuse: Denies     Sexual abuse: Denies   Utilities: Not At Risk (02/10/2023)    [facility] Utilities     Threatened with loss of utilities: No       PHYSICAL EXAM   Physical Exam  Constitutional:       General: She is not in acute distress.     Appearance: Normal appearance. She is ill-appearing.   HENT:      Head: Normocephalic and atraumatic.      Right Ear: External ear normal.      Left Ear: External ear normal.      Nose: Nose normal. No congestion or rhinorrhea.      Mouth/Throat:      Mouth: Mucous membranes are moist.   Eyes:      Extraocular Movements: Extraocular movements intact.      Conjunctiva/sclera: Conjunctivae normal.      Pupils: Pupils are equal, round, and reactive to light.   Cardiovascular:      Rate and Rhythm: Normal rate and regular rhythm.      Pulses: Normal pulses.      Heart sounds: Normal heart sounds.   Pulmonary:      Effort: Pulmonary effort is normal. No respiratory distress.      Breath sounds: Normal breath sounds.   Abdominal:      General: Abdomen is flat. There is no distension.      Tenderness: There is no abdominal tenderness.   Musculoskeletal:         General: No swelling or deformity. Normal range of motion.      Cervical  back: Normal range of motion.   Skin:     General: Skin is warm.      Coloration: Skin is not jaundiced or pale.   Neurological:      General: No focal deficit present.      Mental Status: She is alert and oriented to person, place, and time. Mental status is at baseline.   Psychiatric:         Mood and Affect: Mood normal.         Behavior: Behavior normal.         Thought Content: Thought content normal.         Judgment: Judgment normal.            SCREENINGS                No data recorded    LAB, EKG AND DIAGNOSTIC RESULTS   Labs:  Recent Results (from the past 12 hour(s))   CBC with Auto Differential    Collection Time: 07/17/23 11:51 PM   Result Value Ref Range    WBC 4.7 3.6 - 11.0 K/uL    RBC 3.91 3.80 - 5.20 M/uL    Hemoglobin 10.2 (L) 11.5 - 16.0 g/dL    Hematocrit 16.1 (L) 35.0 - 47.0 %    MCV 76.7 (L) 80.0 - 99.0 FL    MCH 26.1 26.0 - 34.0 PG    MCHC 34.0 30.0 - 36.5 g/dL    RDW 09.6 (H) 04.5 - 14.5 %    Platelets 346 150 - 400 K/uL    MPV 9.2 8.9 - 12.9 FL    Nucleated RBCs 0.0 0.0 PER 100 WBC    nRBC 0.00 0.00 - 0.01 K/uL    Neutrophils % 42.3 32.0 - 75.0 %    Lymphocytes % 43.2 12.0 - 49.0 %    Monocytes % 11.3 5.0 - 13.0 %    Eosinophils % 2.4 0.0 - 7.0 %    Basophils % 0.6 0.0 - 1.0 %    Immature Granulocytes % 0.2 0 - 0.5 %    Neutrophils Absolute 1.98 1.80 - 8.00 K/UL    Lymphocytes Absolute 2.02 0.80 - 3.50 K/UL    Monocytes Absolute 0.53 0.00 - 1.00 K/UL    Eosinophils Absolute 0.11 0.00 - 0.40 K/UL    Basophils Absolute 0.03 0.00 - 0.10 K/UL    Immature Granulocytes Absolute 0.01 0.00 - 0.04 K/UL    Differential Type AUTOMATED     Comprehensive Metabolic Panel    Collection Time: 07/17/23 11:51 PM   Result Value Ref Range    Sodium 141 136 - 145 mmol/L    Potassium 4.0 3.5 - 5.1 mmol/L    Chloride 112 (H) 97 - 108 mmol/L    CO2 21 21 - 32 mmol/L    Anion Gap 8 2 - 12 mmol/L    Glucose 102 (H) 65 - 100 mg/dL    BUN 12 6 - 20 mg/dL    Creatinine 4.09 8.11 - 1.02 mg/dL    BUN/Creatinine Ratio 18 12 - 20      Est, Glom Filt Rate >90 >60 ml/min/1.68m2    Calcium 8.0 (L) 8.5 - 10.1 mg/dL    Total Bilirubin 0.2 0.2 - 1.0 mg/dL    AST 19 15 - 37 U/L    ALT 25 12 - 78 U/L    Alk Phosphatase 79 45 - 117 U/L    Total Protein 6.4 6.4 - 8.2 g/dL  Albumin 2.8 (L) 3.5 - 5.0 g/dL    Globulin 3.6 2.0 - 4.0 g/dL    Albumin/Globulin Ratio 0.8 (L) 1.1 - 2.2     Troponin    Collection Time: 07/17/23 11:51 PM   Result Value Ref Range    Troponin,  High Sensitivity <4 0 - 51 ng/L   Brain Natriuretic Peptide    Collection Time: 07/17/23 11:51 PM   Result Value Ref Range    NT Pro-BNP 33 <125 pg/mL   Magnesium    Collection Time: 07/17/23 11:51 PM   Result Value Ref Range    Magnesium 1.9 1.6 - 2.4 mg/dL   CK    Collection Time: 07/17/23 11:51 PM   Result Value Ref Range    Total CK 76 26 - 192 U/L   D-Dimer, Quantitative    Collection Time: 07/18/23  1:58 AM   Result Value Ref Range    D-Dimer, Quant 0.39 <0.50 ug/ml(FEU)       EKG:.See ED Course Below    Radiologic Studies:  Non-plain film images such as CT, Ultrasound and MRI are read by the radiologist. Plain radiographic images are visualized and preliminarily interpreted by the ED Provider with the following findings: See ED Course Below    Interpretation per the Radiologist below, if available at the time of this note:  CT Head W/O Contrast   Final Result   No acute intracranial process.   There is no intracranial mass or hemorrhage.      Electronically signed by Zenaida Niece HABIB      XR CHEST PORTABLE   Final Result      No acute process on portable chest.         Electronically signed by Carola Rhine           ED COURSE and DIFFERENTIAL DIAGNOSIS/MDM   2:35 AM Differential and Considerations: 43 year old presents with chest pain.  Patient recently diagnosed with COVID.  Patient is low risk according to heart score.  Will obtain troponin and evaluate according to high-sensitivity troponin algorithm.  Will also plan to rule out PE with a D-dimer as patient is a good candidate for Wells criteria.  Chest x-ray rule out pneumonia versus effusion electrolyte evaluation plan for pain control.  Reassessment.    Patient was assessed for risk of pulmonary emboli utilizing Wells criteria:  Clinical signs of DVT: NO  Pulmonary emboli as #1 diagnosis: NO  Heart rate greater than 100: NO  Immobilization of at least 3 days for surgery within the last 4 weeks: NO  Prior history of PE or DVT: NO  Hemoptysis: NO  Malignancy  history: NO    Patient is a low risk according to Wells criteria with a 1.3 chance of pulmonary emboli in the studied ED population.  Also pulmonary emboli is unlikely based on clinical studies.    Patient is a good candidate to utilize d-dimer as a screening tool.  Negative d-dimer along with Wells criteria suggests pulmonary emboli as an unlikely diagnosis    HEART SCORE:     History (slight suspicious 0, moderate +1, highly suspicious +2)  0  EKG (normal 0, nonspecific repol changes +1, ST changes +2)  0  Age (<45 y 0, 45-64y +1, >65y +2)  0  Risk Factors HTN, XOL, DM, obesity, smoker, CAD, PAD (none 0, 1-2 factors +1, >3 +2 factors) 1  Troponin (normal 0, 1-3x limit +1, >3x limit +2)  0    Heart Score 1  Management   Scores 0-3: 0.9-1.7% risk of adverse cardiac event. In the HEART Score study, these patients were discharged (0.99% in the retrospective study, 1.7% in the prospective study)   Scores 4-6: 12-16.6% risk of adverse cardiac event. In the HEART Score study, these patients were admitted to the hospital. (11.6% retrospective, 16.6% prospective)   Scores >=7: 50-65% risk of adverse cardiac event. In the HEART Score study, these patients were candidates for early invasive measures. (65.2% retrospective, 50.1% prospective)   A MACE (Major Adverse Cardiac Event) was defined as all-cause mortality, myocardial infarction, or coronary revascularization.    Original/Primary Reference  Six AJ, Backus BE, Kelder JC. Chest pain in the emergency room: value of the HEART score. Neth Heart J. 2008;16(6):191-6.   Validation  Backus BE, Six AJ, Kelder JC, et al. A prospective validation of the HEART score for chest pain patients at the emergency department. Int J Cardiol. 2013;168(3):2153-8.   Backus BE, Six AJ, Arvilla Market, et al. Chest pain in the emergency room: a multicenter validation of the HEART Score. Crit Pathw Cardiol. 2010;9(3):164-9.  Stopyra JP, Riley RF, Hiestand BC, et al. The HEART Pathway Randomized  Controlled Trial One Year Outcomes. Acad Emerg Med. 2018;         Records Reviewed (source and summary of external notes): Prior medical records and Nursing notes.    Vitals:    Vitals:    07/18/23 0115 07/18/23 0145 07/18/23 0155 07/18/23 0213   BP: 98/65 (!) 86/58 102/74 98/65   Pulse:    81   Resp:    14   Temp:    98.2 F (36.8 C)   TempSrc:    Oral   SpO2: 98% 97% 97% 97%   Weight:       Height:            ED COURSE  ED Course as of 07/18/23 0235   Tue Jul 18, 2023   0042 EKG at 2344 normal sinus rhythm rate of 78.  No ST changes no T wave inversions normal intervals.  Reasonable out ACS.  Interpreted independently by ER physician. [HP]   0055 Hemoglobin Quant(!): 10.2  anemia [HP]   0055 WBC: 4.7  Lab work interpreted independently by ER physician as NORMAL   [HP]   9562 Magnesium: 1.9  Lab work interpreted independently by ER physician as NORMAL   [HP]   0055 Comprehensive Metabolic Panel(!):    Sodium 141   Potassium 4.0   Chloride 112(!)   CARBON DIOXIDE 21   Anion Gap 8   Glucose 102(!)   BUN,BUNPL 12   Creatinine 0.66   Bun/Cre 18   Est, Glom Filt Rate >90   Calcium 8.0(!)   Total Bilirubin 0.2   AST 19   ALT 25   Alkaline Phosphatase 79   Total Protein 6.4   Albumin 2.8(!)   Globulin 3.6   Albumin/Globulin Ratio 0.8(!)  Lab work interpreted independently by ER physician as NORMAL   [HP]   0055 Total CK: 76  Lab work interpreted independently by ER physician as NORMAL   [HP]   0055 NT Pro-BNP: 33  Lab work interpreted independently by ER physician as NORMAL   [HP]   0055 Troponin, High Sensitivity: <4  Lab work interpreted independently by ER physician as NORMAL   [HP]   0055 XR CHEST PORTABLE  FINDINGS: The cardiac silhouette is within normal limits. The pulmonary  vasculature is within normal limits.  The lungs and pleural spaces are clear. The visualized bones and upper abdomen  are age-appropriate.     IMPRESSION:     No acute process on portable chest.        Electronically signed by Carola Rhine    Chest x-ray interpreted independently by ER physician.  No evidence of pneumothorax, rib fracture, pleural effusion pneumonia or dissection.  Negative acute.   [HP]   0056 CT Head W/O Contrast  FINDINGS:  The sulci and ventricles are within normal limits for patient age. There is no  evidence of an acute infarction, hemorrhage, or mass-effect. There is no  evidence of midline shift or hydrocephalus. Posterior fossa structures are  unremarkable. No extra-axial collections are seen.  Mastoid air cells are well pneumatized and clear.    There is no evidence of depressed skull fractures of soft tissue swelling.     IMPRESSION:  No acute intracranial process.  There is no intracranial mass or hemorrhage.     Electronically signed by Zenaida Niece HABIB    CT head interpreted independently by ER physician no evidence of ICH or mass.   [HP]   [id number] D-Dimer, Quant: 0.39  Lab work interpreted independently by ER physician as NORMAL   [HP]   [id number] After fluids patient is feeling much better.  No numbness no weakness.  No clinical evidence of a PE normal troponin.  No second troponin is indicated based on the high-sensitivity troponin algorithm. [HP]   [phone] Plan to road test patient prior to disposition. [HP]      ED Course User Index  [HP] Rayder Sullenger, Geroge Baseman, MD   -------------------------------------------------------------------------------------  COVID-19 Risk of decompensation within 24 hours:    Clinical risk score:   CHF,COPD, age >24 (+2 points each)   0  CXR (moderate nonlobar +1, 1 lobe +2, bilat severe +2) 0  HR > 100 at disposition (+2 points)    0  O2 sats <92% RA (+4 points)     0  RR >20 (+2 points)      0    TOTAL SCORE 0  - SCORE 0-2: Discharged home with routine follow-up  - SCORE 3-4: Discharged home with pulse oximeter or 24-hour follow-up  - SCORE 0-4: Consider aspirin 81 mg p.o. daily for 2 weeks if not contraindicated or allergy  - SCORE 5-8: Consider chest x-ray CBC CMP troponin and lactate.  If troponin  and lactate are normal go to low risk.  If troponin or D-dimer are lactate are elevated go to high risk  - SCORE 9+: Consider admission.  Decadron 6 mg IV.  VTE prophylaxis, antibiotics for pneumonia.  Check vitamin D levels.  Limit fluids and choose pressors early.  Proning.  High flow nasal cannula. [Includes respiratory distress.  Unstable, oxygen need for sats greater than 90% or acute delirium.]      SEPSIS Reassessment: Patient does NOT meet Sepsis criteria after ED workup    Clinical Management Tools:  Not Applicable, Chest Pain: MEDICAL DECISION MAKING:   I considered, but did not perform, additional testing such CT Angiogram, as well as admission or transfer to a higher level of care.     I utilized an evidence-based risk rating tool (CMT) along with my training and experience to weigh the risk of discharge against the risks of further testing, imaging, or hospitalization. At this time, I estimate the risks of additional testing, imaging, or hospitalization to be equal to or greater than the risk of discharge(in the  case of discharge home).      The patient's HEART Score is 1. In rare cases, I give patients with HEART Score of 4 the option of discharge, but only when they meet criteria for "Low 4," meaning that HST was used, and the 4 is not from a highly suspicious story, highly suspicious EKG, or positive cardiac enzymes.  In these selected cases, the risk of a "Low 4" is still most likely lower than the risk of admission and further testing/imaging. [id number]    SHARED DECISION MAKING:   I discussed my risk assessment with the patient. The patient understands and consents to the risk of disposition/plan, as well as the risk of uncertainty in estimating outcomes. [id number]          Patient was given the following medications:  Medications   sodium chloride 0.9 % bolus 1,000 mL (1,000 mLs IntraVENous New Bag 07/18/23 0211)   sodium chloride 0.9 % bolus 1,000 mL (0 mLs IntraVENous Stopped  07/18/23 0213)   acetaminophen (TYLENOL) tablet 1,000 mg (1,000 mg Oral Given 07/17/23 2355)   ketorolac (TORADOL) injection 30 mg (30 mg IntraVENous Given 07/18/23 0156)       CONSULTS: See ED Course/MDM for further details.  None     Social Determinants affecting Diagnosis/Treatment: None    Smoking Cessation: Not Applicable    PROCEDURES   Unless otherwise noted above, none  Procedures      CRITICAL CARE TIME   Patient does not meet Critical Care Time, 0 minutes    ED IMPRESSION     1. Atypical chest pain    2. COVID-19 virus infection          DISPOSITION/PLAN   DISPOSITION Decision To Discharge 07/18/2023 02:34:14 AM   DISPOSITION CONDITION Stable         Discharge Note: The patient is stable for discharge home. The signs, symptoms, diagnosis, and discharge instructions have been discussed, understanding conveyed, and agreed upon. The patient is to follow up as recommended or return to ER should their symptoms worsen.      PATIENT REFERRED TO:  None, None              DISCHARGE MEDICATIONS:     Medication List        ASK your doctor about these medications      acetaminophen 500 MG tablet  Commonly known as: TYLENOL  Take 1 tablet by mouth every 6 hours as needed for Pain     azithromycin 250 MG tablet  Commonly known as: Zithromax Z-Pak  Take 2 tablets (500 mg) on Day 1, and then take 1 tablet (250 mg) on days 2 through 5.     docusate sodium 100 MG capsule  Commonly known as: Colace  Take 1 capsule by mouth 2 times daily     DULoxetine 60 MG extended release capsule  Commonly known as: CYMBALTA  Take 1 capsule by mouth daily     hydrocortisone 1 % cream  Commonly known as: Ala-Cort  Apply topically 2 times daily.     ibuprofen 600 MG tablet  Commonly known as: IBU  Take 1 tablet by mouth every 6 hours as needed for Pain     ketoconazole 2 % cream  Commonly known as: NIZORAL  Apply topically daily.     miSOPROStol 100 MCG tablet  Commonly known as: Cytotec  Take 4 tablets by mouth 4 times daily for 3 days      ondansetron 4 MG  disintegrating tablet  Commonly known as: ZOFRAN-ODT  Place 1 tablet under the tongue every 8 hours as needed for Nausea or Vomiting     Prenatal Vitamin 27-0.8 MG Tabs  Take 1 tablet by mouth daily     risperiDONE 1 MG tablet  Commonly known as: RISPERDAL  Take 1 tablet by mouth 2 times daily     traZODone 100 MG tablet  Commonly known as: DESYREL  Take 1.5 tablets by mouth nightly                DISCONTINUED MEDICATIONS:  Current Discharge Medication List          I am the Primary Clinician of Record. Forde Radon, MD (electronically signed)    (Please note that parts of this dictation were completed with voice recognition software. Quite often unanticipated grammatical, syntax, homophones, and other interpretive errors are inadvertently transcribed by the computer software. Please disregards these errors. Please excuse any errors that have escaped final proofreading.)        Forde Radon, MD  07/18/23 [phone]

## 2023-07-17 NOTE — ED Triage Notes (Signed)
Pt stated she had Charlie horses all over her body yesterday, pt states COVID + yesterday, cannot walk today d/t weakness.

## 2023-07-18 ENCOUNTER — Inpatient Hospital Stay
Admit: 2023-07-18 | Discharge: 2023-07-18 | Disposition: A | Discharge status: Home / Self Care | Payer: MEDICARE | Attending: Emergency Medicine

## 2023-07-18 LAB — MAGNESIUM: Magnesium: 1.9 mg/dL (ref 1.6–2.4)

## 2023-07-18 LAB — COMPREHENSIVE METABOLIC PANEL
ALT: 25 U/L (ref 12–78)
AST: 19 U/L (ref 15–37)
Albumin/Globulin Ratio: 0.8 — ABNORMAL LOW (ref 1.1–2.2)
Albumin: 2.8 g/dL — ABNORMAL LOW (ref 3.5–5.0)
Alk Phosphatase: 79 U/L (ref 45–117)
Anion Gap: 8 mmol/L (ref 2–12)
BUN/Creatinine Ratio: 18 (ref 12–20)
BUN: 12 mg/dL (ref 6–20)
CO2: 21 mmol/L (ref 21–32)
Calcium: 8 mg/dL — ABNORMAL LOW (ref 8.5–10.1)
Chloride: 112 mmol/L — ABNORMAL HIGH (ref 97–108)
Creatinine: 0.66 mg/dL (ref 0.55–1.02)
Est, Glom Filt Rate: >90 mL/min/{1.73_m2} (ref >60)
Globulin: 3.6 g/dL (ref 2.0–4.0)
Glucose: 102 mg/dL — ABNORMAL HIGH (ref 65–100)
Potassium: 4 mmol/L (ref 3.5–5.1)
Sodium: 141 mmol/L (ref 136–145)
Total Bilirubin: 0.2 mg/dL (ref 0.2–1.0)
Total Protein: 6.4 g/dL (ref 6.4–8.2)

## 2023-07-18 LAB — CBC WITH AUTO DIFFERENTIAL
Basophils %: 0.6 % (ref 0.0–1.0)
Basophils Absolute: 0.03 10*3/uL (ref 0.00–0.10)
Eosinophils %: 2.4 % (ref 0.0–7.0)
Eosinophils Absolute: 0.11 10*3/uL (ref 0.00–0.40)
Hematocrit: 30 % — ABNORMAL LOW (ref 35.0–47.0)
Hemoglobin: 10.2 g/dL — ABNORMAL LOW (ref 11.5–16.0)
Immature Granulocytes %: 0.2 % (ref 0–0.5)
Immature Granulocytes Absolute: 0.01 10*3/uL (ref 0.00–0.04)
Lymphocytes %: 43.2 % (ref 12.0–49.0)
Lymphocytes Absolute: 2.02 10*3/uL (ref 0.80–3.50)
MCH: 26.1 pg (ref 26.0–34.0)
MCHC: 34 g/dL (ref 30.0–36.5)
MCV: 76.7 fL — ABNORMAL LOW (ref 80.0–99.0)
MPV: 9.2 fL (ref 8.9–12.9)
Monocytes %: 11.3 % (ref 5.0–13.0)
Monocytes Absolute: 0.53 10*3/uL (ref 0.00–1.00)
Neutrophils %: 42.3 % (ref 32.0–75.0)
Neutrophils Absolute: 1.98 10*3/uL (ref 1.80–8.00)
Nucleated RBCs: 0 /100{WBCs}
Platelets: 346 10*3/uL (ref 150–400)
RBC: 3.91 M/uL (ref 3.80–5.20)
RDW: 15.5 % — ABNORMAL HIGH (ref 11.5–14.5)
WBC: 4.7 10*3/uL (ref 3.6–11.0)
nRBC: 0 10*3/uL (ref 0.00–0.01)

## 2023-07-18 LAB — TROPONIN: Troponin, High Sensitivity: <4 ng/L (ref 0–51)

## 2023-07-18 LAB — CK: Total CK: 76 U/L (ref 26–192)

## 2023-07-18 LAB — BRAIN NATRIURETIC PEPTIDE: NT Pro-BNP: 33 pg/mL (ref <125)

## 2023-07-18 LAB — D-DIMER, QUANTITATIVE: D-Dimer, Quant: 0.39 ug{FEU}/mL (ref <0.50)

## 2023-07-18 MED ORDER — SODIUM CHLORIDE 0.9 % IV BOLUS
0.9 | Freq: Once | INTRAVENOUS | Status: AC
Start: 2023-07-18 — End: 2023-07-18
  Administered 2023-07-18: 07:00:00 1000 mL via INTRAVENOUS

## 2023-07-18 MED ORDER — SODIUM CHLORIDE 0.9 % IV BOLUS
0.9 | Freq: Once | INTRAVENOUS | Status: AC
Start: 2023-07-18 — End: 2023-07-18
  Administered 2023-07-18: 05:00:00 1000 mL via INTRAVENOUS

## 2023-07-18 MED ORDER — KETOROLAC TROMETHAMINE 30 MG/ML IJ SOLN
30 | Freq: Once | INTRAMUSCULAR | Status: AC
Start: 2023-07-18 — End: 2023-07-18
  Administered 2023-07-18: 07:00:00 30 mg via INTRAVENOUS

## 2023-07-18 MED ORDER — ACETAMINOPHEN 500 MG PO TABS
500 | ORAL | Status: AC
Start: 2023-07-18 — End: 2023-07-17
  Administered 2023-07-18: 05:00:00 1000 mg via ORAL

## 2023-07-18 MED FILL — ACETAMINOPHEN EXTRA STRENGTH 500 MG PO TABS: 500 MG | ORAL | Qty: 2

## 2023-07-18 MED FILL — SODIUM CHLORIDE 0.9 % IV SOLN: 0.9 % | INTRAVENOUS | Qty: 1000

## 2023-07-18 MED FILL — KETOROLAC TROMETHAMINE 30 MG/ML IJ SOLN: 30 MG/ML | INTRAMUSCULAR | Qty: 1

## 2023-07-18 NOTE — Discharge Instructions (Signed)
Thank you for choosing our Emergency Department for your care.  It is our privilege to care for you in your time of need.  In the next several days, you may receive a survey via email or mailed to your home about your experience with our team.  We would greatly appreciate you taking a few minutes to complete the survey, as we use this information to learn what we have done well and what we could be doing better. Thank you for trusting [place] with your care!    Below you will find a list of your tests from today's visit.   Labs and Radiology Studies  Recent Results (from the past 12 hour(s))   CBC with Auto Differential    Collection Time: 07/17/23 11:51 PM   Result Value Ref Range    WBC 4.7 3.6 - 11.0 K/uL    RBC 3.91 3.80 - 5.20 M/uL    Hemoglobin 10.2 (L) 11.5 - 16.0 g/dL    Hematocrit 78.2 (L) 35.0 - 47.0 %    MCV 76.7 (L) 80.0 - 99.0 FL    MCH 26.1 26.0 - 34.0 PG    MCHC 34.0 30.0 - 36.5 g/dL    RDW 95.6 (H) 21.3 - 14.5 %    Platelets 346 150 - 400 K/uL    MPV 9.2 8.9 - 12.9 FL    Nucleated RBCs 0.0 0.0 PER 100 WBC    nRBC 0.00 0.00 - 0.01 K/uL    Neutrophils % 42.3 32.0 - 75.0 %    Lymphocytes % 43.2 12.0 - 49.0 %    Monocytes % 11.3 5.0 - 13.0 %    Eosinophils % 2.4 0.0 - 7.0 %    Basophils % 0.6 0.0 - 1.0 %    Immature Granulocytes % 0.2 0 - 0.5 %    Neutrophils Absolute 1.98 1.80 - 8.00 K/UL    Lymphocytes Absolute 2.02 0.80 - 3.50 K/UL    Monocytes Absolute 0.53 0.00 - 1.00 K/UL    Eosinophils Absolute 0.11 0.00 - 0.40 K/UL    Basophils Absolute 0.03 0.00 - 0.10 K/UL    Immature Granulocytes Absolute 0.01 0.00 - 0.04 K/UL    Differential Type AUTOMATED     Comprehensive Metabolic Panel    Collection Time: 07/17/23 11:51 PM   Result Value Ref Range    Sodium 141 136 - 145 mmol/L    Potassium 4.0 3.5 - 5.1 mmol/L    Chloride 112 (H) 97 - 108 mmol/L    CO2 21 21 - 32 mmol/L    Anion Gap 8 2 - 12 mmol/L    Glucose 102 (H) 65 - 100 mg/dL    BUN 12 6 - 20 mg/dL    Creatinine 0.86 5.78 - 1.02 mg/dL     BUN/Creatinine Ratio 18 12 - 20      Est, Glom Filt Rate >90 >60 ml/min/1.41m2    Calcium 8.0 (L) 8.5 - 10.1 mg/dL    Total Bilirubin 0.2 0.2 - 1.0 mg/dL    AST 19 15 - 37 U/L    ALT 25 12 - 78 U/L    Alk Phosphatase 79 45 - 117 U/L    Total Protein 6.4 6.4 - 8.2 g/dL    Albumin 2.8 (L) 3.5 - 5.0 g/dL    Globulin 3.6 2.0 - 4.0 g/dL    Albumin/Globulin Ratio 0.8 (L) 1.1 - 2.2     Troponin    Collection Time: 07/17/23 11:51 PM   Result  Value Ref Range    Troponin, High Sensitivity <4 0 - 51 ng/L   Brain Natriuretic Peptide    Collection Time: 07/17/23 11:51 PM   Result Value Ref Range    NT Pro-BNP 33 <125 pg/mL   Magnesium    Collection Time: 07/17/23 11:51 PM   Result Value Ref Range    Magnesium 1.9 1.6 - 2.4 mg/dL   CK    Collection Time: 07/17/23 11:51 PM   Result Value Ref Range    Total CK 76 26 - 192 U/L   D-Dimer, Quantitative    Collection Time: 07/18/23  1:58 AM   Result Value Ref Range    D-Dimer, Quant 0.39 <0.50 ug/ml(FEU)     CT Head W/O Contrast    Result Date: 07/18/2023  EXAM: CT HEAD WO CONTRAST CLINICAL HISTORY: L sided subj weakness INDICATION: L sided subj weakness COMPARISON: 2021. CT dose reduction was achieved through use of a standardized protocol tailored for this examination and automatic exposure control for dose modulation. TECHNIQUE: Serial axial images with a collimation of 5 mm were obtained from the skull base through the vertex.  Serial axial images with a collimation of 5 mm were obtained from the skull base through the vertex.  Sagittal and coronal reformatted images also obtained.  FINDINGS: The sulci and ventricles are within normal limits for patient age. There is no evidence of an acute infarction, hemorrhage, or mass-effect. There is no evidence of midline shift or hydrocephalus. Posterior fossa structures are unremarkable. No extra-axial collections are seen. Mastoid air cells are well pneumatized and clear.  There is no evidence of depressed skull fractures of soft tissue  swelling.     No acute intracranial process. There is no intracranial mass or hemorrhage. Electronically signed by Zenaida Niece HABIB    XR CHEST PORTABLE    Result Date: 07/17/2023  EXAM:  XR CHEST PORTABLE INDICATION: cp COMPARISON: 06/03/2023 TECHNIQUE: portable chest AP view FINDINGS: The cardiac silhouette is within normal limits. The pulmonary vasculature is within normal limits. The lungs and pleural spaces are clear. The visualized bones and upper abdomen are age-appropriate.     No acute process on portable chest. Electronically signed by Carola Rhine    ------------------------------------------------------------------------------------------------------------  The evaluation and treatment you received in the Emergency Department were for an urgent problem. It is important that you follow-up with a doctor, nurse practitioner, or physician assistant to:  (1) confirm your diagnosis,  (2) re-evaluation of changes in your illness and treatment, and (3) for ongoing care. Please take your discharge instructions with you when you go to your follow-up appointment.     If you have any problem arranging a follow-up appointment, contact us!  If your symptoms become worse or you do not improve as expected, please return to [place]. We are available 24 hours a day.     If a prescription has been provided, please fill it as soon as possible to prevent a delay in treatment. If you have any questions or reservations about taking the medication due to side effects or interactions with other medications, please call your primary care provider or contact us directly.  Again, THANK YOU for choosing [place] to care for YOU!

## 2023-07-19 LAB — EKG 12-LEAD
Atrial Rate: 78 {beats}/min
Diagnosis: NORMAL
P Axis: 79 degrees
P-R Interval: 150 ms
Q-T Interval: 372 ms
QRS Duration: 72 ms
QTc Calculation (Bazett): 424 ms
R Axis: 82 degrees
T Axis: 65 degrees
Ventricular Rate: 78 {beats}/min

## 2023-09-06 ENCOUNTER — Emergency Department: Admit: 2023-09-06 | Payer: MEDICARE

## 2023-09-06 ENCOUNTER — Emergency Department: Payer: MEDICARE

## 2023-09-06 ENCOUNTER — Inpatient Hospital Stay: Admit: 2023-09-06 | Discharge: 2023-09-07 | Disposition: A | Discharge status: Home / Self Care | Payer: MEDICARE

## 2023-09-06 DIAGNOSIS — J4541 Moderate persistent asthma with (acute) exacerbation: Secondary | ICD-10-CM

## 2023-09-06 NOTE — Discharge Instructions (Signed)
 Thank you!  Thank you for allowing me to care for you in the emergency department. It is my goal to provide you with excellent care. If you have not received excellent quality care, please ask to speak to the nurse manager. Please fill out the survey that will come to you by mail or email since we listen to your feedback!     Below you will find a list of your tests from today's visit.  Should you have any questions, please do not hesitate to call the emergency department.    Labs  Recent Results (from the past 12 hours)   CBC with Auto Differential    Collection Time: 09/06/23  7:55 PM   Result Value Ref Range    WBC 5.3 3.6 - 11.0 K/uL    RBC 3.92 3.80 - 5.20 M/uL    Hemoglobin 9.9 (L) 11.5 - 16.0 g/dL    Hematocrit 24.4 (L) 35.0 - 47.0 %    MCV 73.2 (L) 80.0 - 99.0 FL    MCH 25.3 (L) 26.0 - 34.0 PG    MCHC 34.5 30.0 - 36.5 g/dL    RDW 01.0 (H) 27.2 - 14.5 %    Platelets 328 150 - 400 K/uL    MPV 8.9 8.9 - 12.9 FL    Nucleated RBCs 0.0 0.0 PER 100 WBC    nRBC 0.00 0.00 - 0.01 K/uL    Neutrophils % 44.1 32.0 - 75.0 %    Lymphocytes % 43.8 12.0 - 49.0 %    Monocytes % 10.2 5.0 - 13.0 %    Eosinophils % 1.3 0.0 - 7.0 %    Basophils % 0.4 0.0 - 1.0 %    Immature Granulocytes % 0.2 0 - 0.5 %    Neutrophils Absolute 2.33 1.80 - 8.00 K/UL    Lymphocytes Absolute 2.31 0.80 - 3.50 K/UL    Monocytes Absolute 0.54 0.00 - 1.00 K/UL    Eosinophils Absolute 0.07 0.00 - 0.40 K/UL    Basophils Absolute 0.02 0.00 - 0.10 K/UL    Immature Granulocytes Absolute 0.01 0.00 - 0.04 K/UL    Differential Type AUTOMATED     Comprehensive Metabolic Panel    Collection Time: 09/06/23  7:55 PM   Result Value Ref Range    Sodium 143 136 - 145 mmol/L    Potassium 3.7 3.5 - 5.1 mmol/L    Chloride 113 (H) 97 - 108 mmol/L    CO2 28 21 - 32 mmol/L    Anion Gap 2 2 - 12 mmol/L    Glucose 114 (H) 65 - 100 mg/dL    BUN 10 6 - 20 mg/dL    Creatinine 5.36 6.44 - 1.02 mg/dL    BUN/Creatinine Ratio 12 12 - 20      Est, Glom Filt Rate 90 >60 ml/min/1.51m2     Calcium 8.3 (L) 8.5 - 10.1 mg/dL    Total Bilirubin 0.2 0.2 - 1.0 mg/dL    AST 16 15 - 37 U/L    ALT 21 12 - 78 U/L    Alk Phosphatase 84 45 - 117 U/L    Total Protein 6.4 6.4 - 8.2 g/dL    Albumin 2.8 (L) 3.5 - 5.0 g/dL    Globulin 3.6 2.0 - 4.0 g/dL    Albumin/Globulin Ratio 0.8 (L) 1.1 - 2.2     Magnesium    Collection Time: 09/06/23  7:55 PM   Result Value Ref Range    Magnesium 1.8 1.6 -  2.4 mg/dL   Troponin    Collection Time: 09/06/23  7:55 PM   Result Value Ref Range    Troponin, High Sensitivity <4 0 - 51 ng/L   D-Dimer, Quantitative    Collection Time: 09/06/23  9:21 PM   Result Value Ref Range    D-Dimer, Quant <0.27 <0.50 ug/ml(FEU)   COVID-19 & Influenza Combo    Collection Time: 09/06/23  9:33 PM    Specimen: Nasopharyngeal   Result Value Ref Range    SARS-CoV-2, PCR Not Detected Not Detected      Rapid Influenza A By PCR Not Detected Not Detected      Rapid Influenza B By PCR Not Detected Not Detected         Radiologic Studies  XR CHEST 1 VIEW   Final Result   No acute findings.      Electronically signed by Elicia Lamp        @CT48 @  @[email or web address] @  ------------------------------------------------------------------------------------------------------------  The exam and treatment you received in the Emergency Department were for an urgent problem and are not intended as complete care. It is important that you follow-up with a doctor, nurse practitioner, or physician assistant to:  (1) confirm your diagnosis,  (2) re-evaluation of changes in your illness and treatment, and  (3) for ongoing care. Please take your discharge instructions with you when you go to your follow-up appointment.     If you have any problem arranging a follow-up appointment, contact the Emergency Department.  If your symptoms become worse or you do not improve as expected and you are unable to reach your health care provider, please return to the Emergency Department. We are available 24 hours a day.     If a prescription has  been provided, please have it filled as soon as possible to prevent a delay in treatment. If you have any questions or reservations about taking the medication due to side effects or interactions with other medications, please call your primary care provider or contact the ER.

## 2023-09-06 NOTE — ED Triage Notes (Addendum)
 BIB EMS for difficulty breathing ago. Took her own inhaler at home with no relief.  Albuterol given by EMS. Sat 97-98% on RA. Wheezes all over per EMS. ON arrival, pt c/o chest pain and bilateral leg pain.    Pmhx: Asthma

## 2023-09-06 NOTE — ED Provider Notes (Addendum)
 [facility] EMERGENCY DEPARTMENT  EMERGENCY DEPARTMENT HISTORY AND PHYSICAL EXAM      Date: 09/06/2023  Patient Name: Connie Morgan  MRN: [id number]  Birthdate [birth year]  Date of evaluation: 09/06/2023  Provider: Timoteo Ace, PA-C   Note Started: 10:24 PM EDT 09/06/23    HISTORY OF PRESENT ILLNESS     Chief Complaint   Patient presents with    Shortness of Breath       History Provided By: Patient    HPI: Connie Morgan is a 43 y.o. female with past medical history listed below, presents for evaluation of asthma exacerbation, shortness of breath and chest pain.  Patient states that it started about 45 minutes prior to arrival, tried to use her handheld inhaler at home without relief of symptoms.  She was given 1 DuoNeb en route by EMS with some improvement.  She also endorses intermittent bilateral leg pain, but no swelling.  She has also been having cough, nasal congestion and generalized bodyaches for a few days. Denies any fevers, chills,  nausea/vomiting, constipation/diarrhea, abdominal pain, rash, night sweats.    PAST MEDICAL HISTORY   Past Medical History:  Past Medical History:   Diagnosis Date    Asthma     Bleeding hemorrhoids     Depression        Past Surgical History:  No past surgical history on file.    Family History:  Family History   Problem Relation Age of Onset    No Known Problems Mother     No Known Problems Father        Social History:  Social History     Tobacco Use    Smoking status: Former     Current packs/day: 0.25     Types: Cigarettes    Smokeless tobacco: Never    Tobacco comments:     Quit smoking: 1-2 cigarettes a day   Vaping Use    Vaping status: Never Used   Substance Use Topics    Alcohol use: No    Drug use: No       Allergies:  No Known Allergies    PCP: None, None    Current Meds:   No current facility-administered medications for this encounter.     Current Outpatient Medications   Medication Sig Dispense Refill    promethazine-dextromethorphan (PROMETHAZINE-DM)  6.25-15 MG/5ML syrup Take 5 mLs by mouth 4 times daily as needed for Cough 118 mL 0    predniSONE (DELTASONE) 20 MG tablet Take 2 tablets by mouth daily for 4 days 8 tablet 0    loratadine (CLARITIN) 10 MG tablet Take 1 tablet by mouth daily 30 tablet 0    albuterol sulfate HFA (VENTOLIN HFA) 108 (90 Base) MCG/ACT inhaler Inhale 2 puffs into the lungs 4 times daily as needed for Wheezing 1 each 0    albuterol (PROVENTIL) (5 MG/ML) 0.5% nebulizer solution Take 0.5 mLs by nebulization every 6 hours as needed for Wheezing 120 each 0    ibuprofen (IBU) 600 MG tablet Take 1 tablet by mouth every 6 hours as needed for Pain 28 tablet 0    acetaminophen (TYLENOL) 500 MG tablet Take 1 tablet by mouth every 6 hours as needed for Pain 20 tablet 0    azithromycin (ZITHROMAX Z-PAK) 250 MG tablet Take 2 tablets (500 mg) on Day 1, and then take 1 tablet (250 mg) on days 2 through 5. 1 packet 0    ketoconazole (NIZORAL) 2 % cream  Apply topically daily. 30 g 0    DULoxetine (CYMBALTA) 60 MG extended release capsule Take 1 capsule by mouth daily 30 capsule 1    traZODone (DESYREL) 100 MG tablet Take 1.5 tablets by mouth nightly 30 tablet 1    risperiDONE (RISPERDAL) 1 MG tablet Take 1 tablet by mouth 2 times daily 60 tablet 1    hydrocortisone (ALA-CORT) 1 % cream Apply topically 2 times daily. 30 g 1    ondansetron (ZOFRAN-ODT) 4 MG disintegrating tablet Place 1 tablet under the tongue every 8 hours as needed for Nausea or Vomiting 20 tablet 0    miSOPROStol (CYTOTEC) 100 MCG tablet Take 4 tablets by mouth 4 times daily for 3 days 48 tablet 0    Prenatal Vit-Fe Fumarate-FA (PRENATAL VITAMIN) 27-0.8 MG TABS Take 1 tablet by mouth daily 30 tablet 0    docusate sodium (COLACE) 100 MG capsule Take 1 capsule by mouth 2 times daily 30 capsule 0       Social Determinants of Health:   Social Drivers of Health     Tobacco Use: Medium Risk (06/03/2023)    Patient History     Smoking Tobacco Use: Former     Smokeless Tobacco Use: Never      Passive Exposure: Not on file   Alcohol Use: Not At Risk (09/06/2023)    AUDIT-C     Frequency of Alcohol Consumption: Never     Average Number of Drinks: Patient does not drink     Frequency of Binge Drinking: Never   Financial Resource Strain: Not on file   Food Insecurity: Food Insecurity Present (02/10/2023)    Hunger Vital Sign     Worried About Radiation protection practitioner of Food in the Last Year: Never true     Ran Out of Food in the Last Year: Sometimes true   Transportation Needs: No Transportation Needs (02/10/2023)    PRAPARE - Therapist, art (Medical): No     Lack of Transportation (Non-Medical): No   Physical Activity: Not on file   Stress: Not on file   Social Connections: Not on file   Intimate Partner Violence: Not on file   Depression: Not on file   Housing Stability: Low Risk  (02/10/2023)    Housing Stability Vital Sign     Unable to Pay for Housing in the Last Year: No     Number of Times Moved in the Last Year: 0     Homeless in the Last Year: No   Interpersonal Safety: Not At Risk (07/17/2023)    Interpersonal Safety Domain Source: IP Abuse Screening     Physical abuse: Denies     Verbal abuse: Denies     Emotional abuse: Denies     Financial abuse: Denies     Sexual abuse: Denies   Utilities: Not At Risk (02/10/2023)    [facility] Utilities     Threatened with loss of utilities: No       PHYSICAL EXAM   Physical Exam  Vitals and nursing note reviewed.   Constitutional:       General: She is not in acute distress.     Appearance: She is well-developed. She is not ill-appearing.   HENT:      Head: Normocephalic and atraumatic.      Nose: Congestion and rhinorrhea present.      Mouth/Throat:      Mouth: Mucous membranes are moist.      Pharynx: No  posterior oropharyngeal erythema.   Cardiovascular:      Rate and Rhythm: Normal rate and regular rhythm.      Pulses: Normal pulses.      Heart sounds: Normal heart sounds.   Pulmonary:      Effort: Pulmonary effort is normal. No respiratory distress.       Breath sounds: Wheezing present.      Comments: Diffuse wheezing noted bilaterally, no stridor, no use of accessory muscles, no evidence of respiratory distress  Musculoskeletal:         General: Normal range of motion.      Comments: Nontender throughout bilateral legs, no swelling noted, 2+ PT pulse bilaterally that is equal.  Full range of motion within the legs.   Skin:     General: Skin is warm.      Capillary Refill: Capillary refill takes less than 2 seconds.   Neurological:      General: No focal deficit present.      Mental Status: She is alert and oriented to person, place, and time. Mental status is at baseline.      Cranial Nerves: No cranial nerve deficit.      Motor: No weakness.       SCREENINGS               LAB, EKG AND DIAGNOSTIC RESULTS   Labs:  Recent Results (from the past 12 hours)   CBC with Auto Differential    Collection Time: 09/06/23  7:55 PM   Result Value Ref Range    WBC 5.3 3.6 - 11.0 K/uL    RBC 3.92 3.80 - 5.20 M/uL    Hemoglobin 9.9 (L) 11.5 - 16.0 g/dL    Hematocrit 13.0 (L) 35.0 - 47.0 %    MCV 73.2 (L) 80.0 - 99.0 FL    MCH 25.3 (L) 26.0 - 34.0 PG    MCHC 34.5 30.0 - 36.5 g/dL    RDW 86.5 (H) 78.4 - 14.5 %    Platelets 328 150 - 400 K/uL    MPV 8.9 8.9 - 12.9 FL    Nucleated RBCs 0.0 0.0 PER 100 WBC    nRBC 0.00 0.00 - 0.01 K/uL    Neutrophils % 44.1 32.0 - 75.0 %    Lymphocytes % 43.8 12.0 - 49.0 %    Monocytes % 10.2 5.0 - 13.0 %    Eosinophils % 1.3 0.0 - 7.0 %    Basophils % 0.4 0.0 - 1.0 %    Immature Granulocytes % 0.2 0 - 0.5 %    Neutrophils Absolute 2.33 1.80 - 8.00 K/UL    Lymphocytes Absolute 2.31 0.80 - 3.50 K/UL    Monocytes Absolute 0.54 0.00 - 1.00 K/UL    Eosinophils Absolute 0.07 0.00 - 0.40 K/UL    Basophils Absolute 0.02 0.00 - 0.10 K/UL    Immature Granulocytes Absolute 0.01 0.00 - 0.04 K/UL    Differential Type AUTOMATED     Comprehensive Metabolic Panel    Collection Time: 09/06/23  7:55 PM   Result Value Ref Range    Sodium 143 136 - 145 mmol/L    Potassium  3.7 3.5 - 5.1 mmol/L    Chloride 113 (H) 97 - 108 mmol/L    CO2 28 21 - 32 mmol/L    Anion Gap 2 2 - 12 mmol/L    Glucose 114 (H) 65 - 100 mg/dL    BUN 10 6 - 20 mg/dL    Creatinine  0.83 0.55 - 1.02 mg/dL    BUN/Creatinine Ratio 12 12 - 20      Est, Glom Filt Rate 90 >60 ml/min/1.15m2    Calcium 8.3 (L) 8.5 - 10.1 mg/dL    Total Bilirubin 0.2 0.2 - 1.0 mg/dL    AST 16 15 - 37 U/L    ALT 21 12 - 78 U/L    Alk Phosphatase 84 45 - 117 U/L    Total Protein 6.4 6.4 - 8.2 g/dL    Albumin 2.8 (L) 3.5 - 5.0 g/dL    Globulin 3.6 2.0 - 4.0 g/dL    Albumin/Globulin Ratio 0.8 (L) 1.1 - 2.2     Magnesium    Collection Time: 09/06/23  7:55 PM   Result Value Ref Range    Magnesium 1.8 1.6 - 2.4 mg/dL   Troponin    Collection Time: 09/06/23  7:55 PM   Result Value Ref Range    Troponin, High Sensitivity <4 0 - 51 ng/L   D-Dimer, Quantitative    Collection Time: 09/06/23  9:21 PM   Result Value Ref Range    D-Dimer, Quant <0.27 <0.50 ug/ml(FEU)   COVID-19 & Influenza Combo    Collection Time: 09/06/23  9:33 PM    Specimen: Nasopharyngeal   Result Value Ref Range    SARS-CoV-2, PCR Not Detected Not Detected      Rapid Influenza A By PCR Not Detected Not Detected      Rapid Influenza B By PCR Not Detected Not Detected         EKG: Initial EKG interpreted by me.See ED Course Below    Radiologic Studies:  Non-plain film images such as CT, Ultrasound and MRI are read by the radiologist. Plain radiographic images are visualized and preliminarily interpreted by the ED Provider with the following findings: Not Applicable.    Interpretation per the Radiologist below, if available at the time of this note:  XR CHEST 1 VIEW   Final Result   No acute findings.      Electronically signed by Elicia Lamp           EMERGENCY DEPARTMENT COURSE and DIFFERENTIAL DIAGNOSIS/MDM   CC/HPI Summary, DDx, ED Course, and Reassessment:     Patient presents with Shortness of Breath and chest pain. DDx: Asthma exacerbation, Bronchitis, CHF exacerbation, ACS,  PE, PNA, PTX, Anxiety. Will get CBC to evaluate for anemia or bacterial infection, CMP to evaluate for renal/hepatic failure, Troponin to evaluate for ACS, BNP to evaluate fluid status, CXR to evaluate for PNA/effusion/edema and EKG to evaluate for ACS and tachycardia. Will get d-dimer to further evaluate.  Wheezing present on exams will plan to give 2 DuoNebs back-to-back as well as IV Solu-Medrol then reassess.      Records Reviewed (source and summary of external notes): Prior medical records and Nursing notes    Vitals:    Vitals:    09/06/23 2000 09/06/23 2102 09/06/23 2330 09/07/23 0015   BP: 113/61   115/70   Pulse:   83 78   Resp:   18 15   Temp:       TempSrc:       SpO2: 96% 98% 97% 97%   Weight:       Height:            ED COURSE  ED Course as of 09/07/23 0137   Wed Sep 06, 2023   2028 EKG not STEMI by myself and Dr. Andrey Campanile.  Normal  sinus rhythm with rate of 89, normal intervals. [CL]   2029 Magnesium: 1.8 [CL]   2029 Troponin, High Sensitivity: <4 [CL]   2204 D-Dimer, Quant: <0.27 [CL]   2220 CBC no evidence of leukocytosis, chronic anemia noted. [CL]   2220 CMP no additional electrolyte, kidney or liver dysfunction noted. [CL]   2220 Patient sleeping, but arousable  during reassessment, wheezing has greatly improved after medication ministration.  Will give fluid bolus as well as some Toradol for pain.  Waiting flu/COVID swab. [CL]   2311 SARS-CoV-2, PCR: Not Detected [CL]   2311 Rapid Influenza A By PCR: Not Detected [CL]   2311 Rapid Influenza B By PCR: Not Detected [CL]   2345 Discussed all results with patient, no wheezing present during her exam.  Shortness of breath and pain has completely resolved.  Suspect a viral upper respiratory infection that may have caused her asthma exacerbation.  Will refill albuterol, albuterol nebulizer, steroids and cough medicine.  Recommended outpatient follow-up with a PCP, red flag and return precautions were discussed in regards any new or worsening of symptoms.   Patient verbalized understanding was in agreement with the treatment in place and disposition home at this time. [CL]      ED Course User Index  [CL] Timoteo Ace, PA-C       Disposition Considerations (Tests not done, Shared Decision Making, Pt Expectation of Test or Treatment.): Not Applicable  Patient was given the following medications:  Medications   ipratropium 0.5 mg-albuterol 2.5 mg (DUONEB) nebulizer solution 2 Dose (2 Doses Inhalation Given 09/06/23 2102)   methylPREDNISolone sodium succ (SOLU-MEDROL) 125 mg in sterile water 2 mL injection (125 mg IntraVENous Given 09/06/23 2120)   sodium chloride 0.9 % bolus 1,000 mL (0 mLs IntraVENous Stopped 09/07/23 0036)   ketorolac (TORADOL) injection 15 mg (15 mg IntraVENous Given 09/06/23 2258)   acetaminophen (TYLENOL) tablet 1,000 mg (1,000 mg Oral Given 09/06/23 2258)       CONSULTS: (Who and What was discussed)  None     Social Determinants affecting Dx or Tx: None    Smoking Cessation: Not Applicable    PROCEDURES   Unless otherwise noted above, none  Procedures      CRITICAL CARE TIME   Patient does not meet Critical Care Time, 0 minutes    ED FINAL IMPRESSION     1. Moderate persistent asthma with exacerbation    2. Bronchitis    3. Viral upper respiratory tract infection with cough          DISPOSITION/PLAN   DISPOSITION Decision To Discharge 09/07/2023 12:37:08 AM             Discharge Note: The patient is stable for discharge home. The signs, symptoms, diagnosis, and discharge instructions have been discussed, understanding conveyed, and agreed upon. The patient is to follow up as recommended or return to ER should their symptoms worsen.      PATIENT REFERRED TO:  Brantley Stage, MD  [address]  [address] [place] [address]  [phone]      Call for PCP appointment        DISCHARGE MEDICATIONS:     Medication List        START taking these medications      * albuterol sulfate HFA 108 (90 Base) MCG/ACT inhaler  Commonly known as: Ventolin  HFA  Inhale 2 puffs into the lungs 4 times daily as needed for Wheezing     *  albuterol (5 MG/ML) 0.5% nebulizer solution  Commonly known as: PROVENTIL  Take 0.5 mLs by nebulization every 6 hours as needed for Wheezing     loratadine 10 MG tablet  Commonly known as: Claritin  Take 1 tablet by mouth daily     predniSONE 20 MG tablet  Commonly known as: DELTASONE  Take 2 tablets by mouth daily for 4 days     promethazine-dextromethorphan 6.25-15 MG/5ML syrup  Commonly known as: PROMETHAZINE-DM  Take 5 mLs by mouth 4 times daily as needed for Cough           * This list has 2 medication(s) that are the same as other medications prescribed for you. Read the directions carefully, and ask your doctor or other care provider to review them with you.                ASK your doctor about these medications      acetaminophen 500 MG tablet  Commonly known as: TYLENOL  Take 1 tablet by mouth every 6 hours as needed for Pain     azithromycin 250 MG tablet  Commonly known as: Zithromax Z-Pak  Take 2 tablets (500 mg) on Day 1, and then take 1 tablet (250 mg) on days 2 through 5.     docusate sodium 100 MG capsule  Commonly known as: Colace  Take 1 capsule by mouth 2 times daily     DULoxetine 60 MG extended release capsule  Commonly known as: CYMBALTA  Take 1 capsule by mouth daily     hydrocortisone 1 % cream  Commonly known as: Ala-Cort  Apply topically 2 times daily.     ibuprofen 600 MG tablet  Commonly known as: IBU  Take 1 tablet by mouth every 6 hours as needed for Pain     ketoconazole 2 % cream  Commonly known as: NIZORAL  Apply topically daily.     miSOPROStol 100 MCG tablet  Commonly known as: Cytotec  Take 4 tablets by mouth 4 times daily for 3 days     ondansetron 4 MG disintegrating tablet  Commonly known as: ZOFRAN-ODT  Place 1 tablet under the tongue every 8 hours as needed for Nausea or Vomiting     Prenatal Vitamin 27-0.8 MG Tabs  Take 1 tablet by mouth daily     risperiDONE 1 MG tablet  Commonly known as:  RISPERDAL  Take 1 tablet by mouth 2 times daily     traZODone 100 MG tablet  Commonly known as: DESYREL  Take 1.5 tablets by mouth nightly               Where to Get Your Medications        These medications were sent to [id number] [place], [place] - [address] - P [phone] - F [phone]  [address] [place] [address]      Phone: [phone]   albuterol (5 MG/ML) 0.5% nebulizer solution  albuterol sulfate HFA 108 (90 Base) MCG/ACT inhaler  loratadine 10 MG tablet  predniSONE 20 MG tablet  promethazine-dextromethorphan 6.25-15 MG/5ML syrup           DISCONTINUED MEDICATIONS:  Discharge Medication List as of 09/06/2023 11:41 PM          I am the Primary Clinician of Record. Timoteo Ace, PA-C (electronically signed)    (Please note that parts of this dictation were completed with voice recognition software. Quite often unanticipated grammatical, syntax, homophones, and other interpretive  errors are inadvertently transcribed by the computer software. Please disregards these errors. Please excuse any errors that have escaped final proofreading.)     Timoteo Ace, PA-C  09/07/23 0052       Hedy Jacob Aquilla, [place]  09/07/23 [phone]

## 2023-09-07 LAB — COMPREHENSIVE METABOLIC PANEL
ALT: 21 U/L (ref 12–78)
AST: 16 U/L (ref 15–37)
Albumin/Globulin Ratio: 0.8 — ABNORMAL LOW (ref 1.1–2.2)
Albumin: 2.8 g/dL — ABNORMAL LOW (ref 3.5–5.0)
Alk Phosphatase: 84 U/L (ref 45–117)
Anion Gap: 2 mmol/L (ref 2–12)
BUN/Creatinine Ratio: 12 (ref 12–20)
BUN: 10 mg/dL (ref 6–20)
CO2: 28 mmol/L (ref 21–32)
Calcium: 8.3 mg/dL — ABNORMAL LOW (ref 8.5–10.1)
Chloride: 113 mmol/L — ABNORMAL HIGH (ref 97–108)
Creatinine: 0.83 mg/dL (ref 0.55–1.02)
Est, Glom Filt Rate: 90 mL/min/{1.73_m2} (ref >60)
Globulin: 3.6 g/dL (ref 2.0–4.0)
Glucose: 114 mg/dL — ABNORMAL HIGH (ref 65–100)
Potassium: 3.7 mmol/L (ref 3.5–5.1)
Sodium: 143 mmol/L (ref 136–145)
Total Bilirubin: 0.2 mg/dL (ref 0.2–1.0)
Total Protein: 6.4 g/dL (ref 6.4–8.2)

## 2023-09-07 LAB — COVID-19 & INFLUENZA COMBO
Rapid Influenza A By PCR: NOT DETECTED
Rapid Influenza B By PCR: NOT DETECTED
SARS-CoV-2, PCR: NOT DETECTED

## 2023-09-07 LAB — CBC WITH AUTO DIFFERENTIAL
Basophils %: 0.4 % (ref 0.0–1.0)
Basophils Absolute: 0.02 10*3/uL (ref 0.00–0.10)
Eosinophils %: 1.3 % (ref 0.0–7.0)
Eosinophils Absolute: 0.07 10*3/uL (ref 0.00–0.40)
Hematocrit: 28.7 % — ABNORMAL LOW (ref 35.0–47.0)
Hemoglobin: 9.9 g/dL — ABNORMAL LOW (ref 11.5–16.0)
Immature Granulocytes %: 0.2 % (ref 0–0.5)
Immature Granulocytes Absolute: 0.01 10*3/uL (ref 0.00–0.04)
Lymphocytes %: 43.8 % (ref 12.0–49.0)
Lymphocytes Absolute: 2.31 10*3/uL (ref 0.80–3.50)
MCH: 25.3 pg — ABNORMAL LOW (ref 26.0–34.0)
MCHC: 34.5 g/dL (ref 30.0–36.5)
MCV: 73.2 FL — ABNORMAL LOW (ref 80.0–99.0)
MPV: 8.9 FL (ref 8.9–12.9)
Monocytes %: 10.2 % (ref 5.0–13.0)
Monocytes Absolute: 0.54 10*3/uL (ref 0.00–1.00)
Neutrophils %: 44.1 % (ref 32.0–75.0)
Neutrophils Absolute: 2.33 10*3/uL (ref 1.80–8.00)
Nucleated RBCs: 0 /100{WBCs}
Platelets: 328 10*3/uL (ref 150–400)
RBC: 3.92 M/uL (ref 3.80–5.20)
RDW: 15.9 % — ABNORMAL HIGH (ref 11.5–14.5)
WBC: 5.3 10*3/uL (ref 3.6–11.0)
nRBC: 0 10*3/uL (ref 0.00–0.01)

## 2023-09-07 LAB — TROPONIN: Troponin, High Sensitivity: <4 ng/L (ref 0–51)

## 2023-09-07 LAB — MAGNESIUM: Magnesium: 1.8 mg/dL (ref 1.6–2.4)

## 2023-09-07 LAB — D-DIMER, QUANTITATIVE: D-Dimer, Quant: <0.27 ug{FEU}/mL (ref <0.50)

## 2023-09-07 MED ORDER — ACETAMINOPHEN 500 MG PO TABS
500 | ORAL | Status: AC
Start: 2023-09-07 — End: 2023-09-06
  Administered 2023-09-07: 03:00:00 1000 mg via ORAL

## 2023-09-07 MED ORDER — LORATADINE 10 MG PO TABS
10 | ORAL_TABLET | Freq: Every day | ORAL | 0 refills | Status: AC
Start: 2023-09-07 — End: ?

## 2023-09-07 MED ORDER — ALBUTEROL SULFATE (5 MG/ML) 0.5% IN NEBU
Freq: Four times a day (QID) | RESPIRATORY_TRACT | 0 refills | Status: AC | PRN
Start: 2023-09-07 — End: ?

## 2023-09-07 MED ORDER — ALBUTEROL SULFATE HFA 108 (90 BASE) MCG/ACT IN AERS
108 | Freq: Four times a day (QID) | RESPIRATORY_TRACT | 0 refills | Status: AC | PRN
Start: 2023-09-07 — End: ?

## 2023-09-07 MED ORDER — PROMETHAZINE-DM 6.25-15 MG/5ML PO SYRP
6.25-15 | Freq: Four times a day (QID) | ORAL | 0 refills | Status: AC | PRN
Start: 2023-09-07 — End: 2023-09-13

## 2023-09-07 MED ORDER — IPRATROPIUM-ALBUTEROL 0.5-2.5 (3) MG/3ML IN SOLN
0.5-2.5 | RESPIRATORY_TRACT | Status: AC
Start: 2023-09-07 — End: 2023-09-06
  Administered 2023-09-07: 01:00:00 2 via RESPIRATORY_TRACT

## 2023-09-07 MED ORDER — SODIUM CHLORIDE 0.9 % IV BOLUS
0.9 | Freq: Once | INTRAVENOUS | Status: AC
Start: 2023-09-07 — End: 2023-09-07
  Administered 2023-09-07: 03:00:00 1000 mL via INTRAVENOUS

## 2023-09-07 MED ORDER — PREDNISONE 20 MG PO TABS
20 | ORAL_TABLET | Freq: Every day | ORAL | 0 refills | Status: AC
Start: 2023-09-07 — End: 2023-09-10

## 2023-09-07 MED ORDER — KETOROLAC TROMETHAMINE 15 MG/ML IJ SOLN
15 | Freq: Once | INTRAMUSCULAR | Status: AC
Start: 2023-09-07 — End: 2023-09-06
  Administered 2023-09-07: 03:00:00 15 mg via INTRAVENOUS

## 2023-09-07 MED ORDER — METHYLPREDNISOLONE SODIUM SUCC 125 MG IJ SOLR
125 | INTRAMUSCULAR | Status: AC
Start: 2023-09-07 — End: 2023-09-06
  Administered 2023-09-07: 01:00:00 125 mg via INTRAVENOUS

## 2023-09-07 MED FILL — KETOROLAC TROMETHAMINE 15 MG/ML IJ SOLN: 15 MG/ML | INTRAMUSCULAR | Qty: 1 | Fill #0

## 2023-09-07 MED FILL — ACETAMINOPHEN EXTRA STRENGTH 500 MG PO TABS: 500 MG | ORAL | Qty: 2 | Fill #0

## 2023-09-07 MED FILL — SODIUM CHLORIDE 0.9 % IV SOLN: 0.9 % | INTRAVENOUS | Qty: 1000 | Fill #0

## 2023-09-07 MED FILL — IPRATROPIUM-ALBUTEROL 0.5-2.5 (3) MG/3ML IN SOLN: 0.5-2.5 (3) MG/3ML | RESPIRATORY_TRACT | Qty: 6 | Fill #0

## 2023-09-07 MED FILL — METHYLPREDNISOLONE SODIUM SUCC 125 MG IJ SOLR: 125 MG | INTRAMUSCULAR | Qty: 125 | Fill #0

## 2023-09-08 LAB — EKG 12-LEAD
Atrial Rate: 89 {beats}/min
Diagnosis: NORMAL
P Axis: 63 degrees
P-R Interval: 146 ms
Q-T Interval: 354 ms
QRS Duration: 78 ms
QTc Calculation (Bazett): 430 ms
R Axis: 60 degrees
T Axis: 54 degrees
Ventricular Rate: 89 {beats}/min

## 2024-04-08 ENCOUNTER — Emergency Department: Admit: 2024-04-08 | Payer: Medicare (Managed Care)

## 2024-04-08 ENCOUNTER — Inpatient Hospital Stay
Admit: 2024-04-08 | Discharge: 2024-04-08 | Disposition: A | Discharge status: Home / Self Care | Payer: Medicare (Managed Care) | Arrived: WI

## 2024-04-08 DIAGNOSIS — O200 Threatened abortion: Principal | ICD-10-CM

## 2024-04-08 LAB — BASIC METABOLIC PANEL
Anion Gap: 12 mmol/L (ref 2–14)
BUN/Creatinine Ratio: 12 (ref 12–20)
BUN: 8 mg/dL (ref 6–20)
CO2: 22 mmol/L (ref 20–29)
Calcium: 8.6 mg/dL (ref 8.6–10.0)
Chloride: 105 mmol/L (ref 98–107)
Creatinine: 0.66 mg/dL (ref 0.60–1.00)
Est, Glom Filt Rate: >90 ml/min/1.73m2 (ref >59)
Glucose: 83 mg/dL (ref 65–100)
Potassium: 3.4 mmol/L — ABNORMAL LOW (ref 3.5–5.1)
Sodium: 139 mmol/L (ref 136–145)

## 2024-04-08 LAB — URINALYSIS WITH MICROSCOPIC
BACTERIA, URINE: NEGATIVE /HPF
Bilirubin, Urine: NEGATIVE
Glucose, Ur: NEGATIVE mg/dL
Ketones, Urine: 5 mg/dL — AB
Nitrite, Urine: NEGATIVE
Protein, UA: NEGATIVE mg/dL
Specific Gravity, UA: 1.024 (ref 1.003–1.030)
Urobilinogen, Urine: 0.1 EU/dL (ref 0.1–1.0)
pH, Urine: 5 (ref 5.0–8.0)

## 2024-04-08 LAB — CBC WITH AUTO DIFFERENTIAL
Basophils %: 0.5 % (ref 0.0–1.0)
Basophils Absolute: 0.02 K/UL (ref 0.00–0.10)
Eosinophils %: 2.7 % (ref 0.0–7.0)
Eosinophils Absolute: 0.11 K/UL (ref 0.00–0.40)
Hematocrit: 30 % — ABNORMAL LOW (ref 35.0–47.0)
Hemoglobin: 10.5 g/dL — ABNORMAL LOW (ref 11.5–16.0)
Immature Granulocytes %: 0.2 % (ref 0–0.5)
Immature Granulocytes Absolute: 0.01 K/UL (ref 0.00–0.04)
Lymphocytes %: 29.4 % (ref 12.0–49.0)
Lymphocytes Absolute: 1.21 K/UL (ref 0.80–3.50)
MCH: 24.2 pg — ABNORMAL LOW (ref 26.0–34.0)
MCHC: 35 g/dL (ref 30.0–36.5)
MCV: 69.3 FL — ABNORMAL LOW (ref 80.0–99.0)
MPV: 8.7 FL — ABNORMAL LOW (ref 8.9–12.9)
Monocytes %: 10 % (ref 5.0–13.0)
Monocytes Absolute: 0.41 K/UL (ref 0.00–1.00)
Neutrophils %: 57.2 % (ref 32.0–75.0)
Neutrophils Absolute: 2.35 K/UL (ref 1.80–8.00)
Nucleated RBCs: 0 /100{WBCs}
Platelets: 403 K/uL — ABNORMAL HIGH (ref 150–400)
RBC: 4.33 M/uL (ref 3.80–5.20)
RDW: 18.2 % — ABNORMAL HIGH (ref 11.5–14.5)
WBC: 4.1 K/uL (ref 3.6–11.0)
nRBC: 0 K/uL (ref 0.00–0.01)

## 2024-04-08 LAB — TYPE AND SCREEN
ABO/Rh: O POS
Antibody Screen: NEGATIVE

## 2024-04-08 LAB — HCG, QUANTITATIVE, PREGNANCY: hCG Quant: 29140 m[IU]/mL

## 2024-04-08 NOTE — Discharge Instructions (Addendum)
 "    Thank you for choosing our Emergency Department for your care.  It is our privilege to care for you in your time of need.  In the next several days, you may receive a survey via email or mailed to your home about your experience with our team.  We would greatly appreciate you taking a few minutes to complete the survey, as we use this information to learn what we have done well and what we could be doing better. Thank you for trusting us  with your care!    Below you will find a list of your tests from today's visit.   Labs and Radiology Studies  Recent Results (from the past 12 hours)   CBC with Auto Differential    Collection Time: 04/08/24 10:50 AM   Result Value Ref Range    WBC 4.1 3.6 - 11.0 K/uL    RBC 4.33 3.80 - 5.20 M/uL    Hemoglobin 10.5 (L) 11.5 - 16.0 g/dL    Hematocrit 69.9 (L) 35.0 - 47.0 %    MCV 69.3 (L) 80.0 - 99.0 FL    MCH 24.2 (L) 26.0 - 34.0 PG    MCHC 35.0 30.0 - 36.5 g/dL    RDW 81.7 (H) 88.4 - 14.5 %    Platelets 403 (H) 150 - 400 K/uL    MPV 8.7 (L) 8.9 - 12.9 FL    Nucleated RBCs 0.0 0.0 PER 100 WBC    nRBC 0.00 0.00 - 0.01 K/uL    Neutrophils % 57.2 32.0 - 75.0 %    Lymphocytes % 29.4 12.0 - 49.0 %    Monocytes % 10.0 5.0 - 13.0 %    Eosinophils % 2.7 0.0 - 7.0 %    Basophils % 0.5 0.0 - 1.0 %    Immature Granulocytes % 0.2 0 - 0.5 %    Neutrophils Absolute 2.35 1.80 - 8.00 K/UL    Lymphocytes Absolute 1.21 0.80 - 3.50 K/UL    Monocytes Absolute 0.41 0.00 - 1.00 K/UL    Eosinophils Absolute 0.11 0.00 - 0.40 K/UL    Basophils Absolute 0.02 0.00 - 0.10 K/UL    Immature Granulocytes Absolute 0.01 0.00 - 0.04 K/UL    Differential Type Smear Scanned      RBC Comment Anisocytosis  1+        RBC Comment Microcytosis  2+       HCG, Quantitative, Pregnancy    Collection Time: 04/08/24 10:50 AM   Result Value Ref Range    hCG Quant 29,140 mIU/mL   TYPE AND SCREEN    Collection Time: 04/08/24 10:50 AM   Result Value Ref Range    Crossmatch expiration date 04/11/2024,2359     ABO/Rh O Positive      Antibody Screen Negative    BMP    Collection Time: 04/08/24 10:50 AM   Result Value Ref Range    Sodium 139 136 - 145 mmol/L    Potassium 3.4 (L) 3.5 - 5.1 mmol/L    Chloride 105 98 - 107 mmol/L    CO2 22 20 - 29 mmol/L    Anion Gap 12 2 - 14 mmol/L    Glucose 83 65 - 100 mg/dL    BUN 8 6 - 20 mg/dL    Creatinine 9.33 9.39 - 1.00 mg/dL    BUN/Creatinine Ratio 12 12 - 20      Est, Glom Filt Rate >90 >59 ml/min/1.40m2    Calcium 8.6 8.6 -  10.0 mg/dL   Urinalysis with Microscopic    Collection Time: 04/08/24 12:17 PM   Result Value Ref Range    Color, UA Yellow/Straw      Appearance Turbid (A) Clear      Specific Gravity, UA 1.024 1.003 - 1.030      pH, Urine 5.0 5.0 - 8.0      Protein, UA Negative Negative mg/dL    Glucose, Ur Negative Negative mg/dL    Ketones, Urine 5 (A) Negative mg/dL    Bilirubin, Urine Negative Negative      Blood, Urine Moderate (A) Negative      Urobilinogen, Urine 0.1 0.1 - 1.0 EU/dL    Nitrite, Urine Negative Negative      Leukocyte Esterase, Urine Trace (A) Negative      WBC, UA 0-4 0 - 4 /hpf    RBC, UA 0-5 0 - 5 /hpf    Epithelial Cells, UA Many (A) Few /lpf    BACTERIA, URINE Negative Negative /hpf    Mucus, UA Trace (A) Negative /lpf     US  OB TRANSVAGINAL  Result Date: 04/08/2024  INDICATION:  eval for IUP EXAM: TRANSVAGINAL ULTRASOUND FOR EARLY PREGNANCY. COMPARISON: None. TECHNIQUE: Realtime sonography of the pelvis was performed transvaginally with multiple static images obtained. SABRA FINDINGS: Transvaginal:: UTERINE SIZE IS 10.3 x 7.6 x 8.9. The examination demonstrates a single intrauterine pregnancy with a gestational sac in the fundal endometrial canal, measuring 2.5 x 2.0 x 1.3 cm, correlating with an estimated gestational age of [redacted] weeks 6 days. There is a yolk sac in the gestational sac, but no fetal pole is detected. There is no fetal cardiac motion.. The internal cervical os is closed, with a nabothian cysts.. AMNIOTIC FLUID volume is normal. The PLACENTA cannot be  evaluated due to early gestational age. The right ovary measures 3.0 x 3.5 x 2.7 cm, with a simple 2.3 cm cyst. The right ovary shows normal blood flow by color Doppler. The left ovary cannot be visualized due to the gravid uterus and surrounding bowel gas..     Single intrauterine gestational sac with yolk sac but no fetal pole or cardiac motion. Gestational sac size suggests gestational age of [redacted] weeks 6 days. Viable intrauterine gestation is not confirmed, and follow-up obstetric ultrasonography will be necessary. Normal right ovary. Electronically signed by Berle Comfort    ------------------------------------------------------------------------------------------------------------  The evaluation and treatment you received in the Emergency Department were for an urgent problem. It is important that you follow-up with a doctor, nurse practitioner, or physician assistant to:  (1) confirm your diagnosis,  (2) re-evaluation of changes in your illness and treatment, and (3) for ongoing care. Please take your discharge instructions with you when you go to your follow-up appointment.     If you have any problem arranging a follow-up appointment, contact us !  If your symptoms become worse or you do not improve as expected, please return to us . We are available 24 hours a day.     If a prescription has been provided, please fill it as soon as possible to prevent a delay in treatment. If you have any questions or reservations about taking the medication due to side effects or interactions with other medications, please call your primary care provider or contact us  directly.  Again, THANK YOU for choosing us  to care for YOU!    "

## 2024-04-08 NOTE — ED Notes (Addendum)
"  Assumed care of patient. Pt resting in position of comfort. Call bell within reach. Pt placed on monitor x 2. Pt reports vaginal bleeding and abd cramping. Pt reports prescribed macrobid and odt zofran  at another ED  "

## 2024-04-08 NOTE — ED Notes (Signed)
"  pt eating spaghetti and breadstick when RN entered room  "

## 2024-04-08 NOTE — ED Triage Notes (Signed)
"  Patient arrives with abd cramping and bleeding [redacted] weeks pregnant,   "

## 2024-04-08 NOTE — ED Notes (Signed)
"  Urine sent and pt updated on plan of care  "

## 2024-04-08 NOTE — ED Notes (Signed)
 To US 

## 2024-04-08 NOTE — ED Provider Notes (Signed)
 "[facility]  EMERGENCY DEPARTMENT HISTORY AND PHYSICAL EXAM      Date of evaluation: 04/08/2024  Patient Name: Connie Morgan  Birthdate February 17, 1981  MRN: [id number]  ED Provider: Morene Okey Huh, MD   Note Started: 1:16 PM EST 04/08/24    HISTORY OF PRESENT ILLNESS     Chief Complaint   Patient presents with    Vaginal Bleeding       History Provided By: Patient, only     HPI: Connie Morgan is a 43 y.o. female for for AVM presents for evaluation of vaginal bleeding.  Patient approximately [redacted] weeks pregnant by her own estimate, states positive pregnancy test at home within the last week.  Does have some spotting when wiping.  Slight abdominal cramping that is since resolved.  PAST MEDICAL HISTORY   Past Medical History:  Past Medical History:   Diagnosis Date    Asthma     Bleeding hemorrhoids     Depression        Past Surgical History:  No past surgical history on file.    Family History:  Family History   Problem Relation Age of Onset    No Known Problems Mother     No Known Problems Father        Social History:  Social History     Tobacco Use    Smoking status: Former     Current packs/day: 0.25     Types: Cigarettes    Smokeless tobacco: Never    Tobacco comments:     Quit smoking: 1-2 cigarettes a day   Vaping Use    Vaping status: Never Used   Substance Use Topics    Alcohol use: No    Drug use: No       Allergies:  No Known Allergies    PCP: None, None    Current Meds:   No current facility-administered medications for this encounter.     Current Outpatient Medications   Medication Sig Dispense Refill    nitrofurantoin, macrocrystal-monohydrate, (MACROBID) 100 MG capsule Take 1 capsule by mouth every 12 hours      loratadine  (CLARITIN ) 10 MG tablet Take 1 tablet by mouth daily (Patient not taking: Reported on 04/08/2024) 30 tablet 0    albuterol  sulfate HFA (VENTOLIN  HFA) 108 (90 Base) MCG/ACT inhaler Inhale 2 puffs into the lungs 4 times daily as needed for Wheezing (Patient not taking: Reported on  04/08/2024) 1 each 0    albuterol  (PROVENTIL ) (5 MG/ML) 0.5% nebulizer solution Take 0.5 mLs by nebulization every 6 hours as needed for Wheezing (Patient not taking: Reported on 04/08/2024) 120 each 0    ibuprofen  (IBU) 600 MG tablet Take 1 tablet by mouth every 6 hours as needed for Pain 28 tablet 0    acetaminophen  (TYLENOL ) 500 MG tablet Take 1 tablet by mouth every 6 hours as needed for Pain 20 tablet 0    azithromycin  (ZITHROMAX  Z-PAK) 250 MG tablet Take 2 tablets (500 mg) on Day 1, and then take 1 tablet (250 mg) on days 2 through 5. (Patient not taking: Reported on 04/08/2024) 1 packet 0    ketoconazole  (NIZORAL ) 2 % cream Apply topically daily. (Patient not taking: Reported on 04/08/2024) 30 g 0    DULoxetine  (CYMBALTA ) 60 MG extended release capsule Take 1 capsule by mouth daily (Patient not taking: Reported on 04/08/2024) 30 capsule 1    traZODone  (DESYREL ) 100 MG tablet Take 1.5 tablets by mouth nightly (Patient not taking: Reported  on 04/08/2024) 30 tablet 1    risperiDONE  (RISPERDAL ) 1 MG tablet Take 1 tablet by mouth 2 times daily (Patient not taking: Reported on 04/08/2024) 60 tablet 1    hydrocortisone  (ALA-CORT ) 1 % cream Apply topically 2 times daily. (Patient not taking: Reported on 04/08/2024) 30 g 1    ondansetron  (ZOFRAN -ODT) 4 MG disintegrating tablet Place 1 tablet under the tongue every 8 hours as needed for Nausea or Vomiting 20 tablet 0    miSOPROStol  (CYTOTEC ) 100 MCG tablet Take 4 tablets by mouth 4 times daily for 3 days 48 tablet 0    Prenatal Vit-Fe Fumarate-FA (PRENATAL VITAMIN) 27-0.8 MG TABS Take 1 tablet by mouth daily (Patient not taking: Reported on 04/08/2024) 30 tablet 0    docusate sodium  (COLACE) 100 MG capsule Take 1 capsule by mouth 2 times daily (Patient not taking: Reported on 04/08/2024) 30 capsule 0       Social Determinants of Health:   Social Drivers of Health     Tobacco Use: Medium Risk (06/03/2023)    Patient History     Smoking Tobacco Use: Former     Smokeless Tobacco  Use: Never     Passive Exposure: Not on file   Alcohol Use: Not At Risk (09/06/2023)    AUDIT-C     Frequency of Alcohol Consumption: Never     Average Number of Drinks: Patient does not drink     Frequency of Binge Drinking: Never   Financial Resource Strain: Not on file   Food Insecurity: Food Insecurity Present (02/10/2023)    Hunger Vital Sign     Worried About Radiation Protection Practitioner of Food in the Last Year: Never true     Ran Out of Food in the Last Year: Sometimes true   Transportation Needs: No Transportation Needs (02/10/2023)    PRAPARE - Therapist, Art (Medical): No     Lack of Transportation (Non-Medical): No   Physical Activity: Not on file   Stress: Not on file   Social Connections: Not on file   Intimate Partner Violence: Not on file   Depression: Not on file   Housing Stability: Low Risk  (02/10/2023)    Housing Stability Vital Sign     Unable to Pay for Housing in the Last Year: No     Number of Times Moved in the Last Year: 0     Homeless in the Last Year: No   Interpersonal Safety: Not At Risk (07/17/2023)    Interpersonal Safety Domain Source: IP Abuse Screening     Physical abuse: Denies     Verbal abuse: Denies     Emotional abuse: Denies     Financial abuse: Denies     Sexual abuse: Denies   Utilities: Not At Risk (02/10/2023)    [facility] Utilities     Threatened with loss of utilities: No     PHYSICAL EXAM   Physical Exam  HENT:      Head: Normocephalic.   Cardiovascular:      Rate and Rhythm: Normal rate.   Pulmonary:      Effort: Pulmonary effort is normal.   Abdominal:      General: There is no distension.   Musculoskeletal:         General: No deformity.   Skin:     General: Skin is dry.   Neurological:      General: No focal deficit present.      Mental Status: She  is alert.   Psychiatric:         Mood and Affect: Mood normal.           SCREENINGS                No data recorded       LAB, EKG AND DIAGNOSTIC RESULTS   Labs:  Recent Results (from the past 12 hours)   CBC with Auto  Differential    Collection Time: 04/08/24 10:50 AM   Result Value Ref Range    WBC 4.1 3.6 - 11.0 K/uL    RBC 4.33 3.80 - 5.20 M/uL    Hemoglobin 10.5 (L) 11.5 - 16.0 g/dL    Hematocrit 69.9 (L) 35.0 - 47.0 %    MCV 69.3 (L) 80.0 - 99.0 FL    MCH 24.2 (L) 26.0 - 34.0 PG    MCHC 35.0 30.0 - 36.5 g/dL    RDW 81.7 (H) 88.4 - 14.5 %    Platelets 403 (H) 150 - 400 K/uL    MPV 8.7 (L) 8.9 - 12.9 FL    Nucleated RBCs 0.0 0.0 PER 100 WBC    nRBC 0.00 0.00 - 0.01 K/uL    Neutrophils % 57.2 32.0 - 75.0 %    Lymphocytes % 29.4 12.0 - 49.0 %    Monocytes % 10.0 5.0 - 13.0 %    Eosinophils % 2.7 0.0 - 7.0 %    Basophils % 0.5 0.0 - 1.0 %    Immature Granulocytes % 0.2 0 - 0.5 %    Neutrophils Absolute 2.35 1.80 - 8.00 K/UL    Lymphocytes Absolute 1.21 0.80 - 3.50 K/UL    Monocytes Absolute 0.41 0.00 - 1.00 K/UL    Eosinophils Absolute 0.11 0.00 - 0.40 K/UL    Basophils Absolute 0.02 0.00 - 0.10 K/UL    Immature Granulocytes Absolute 0.01 0.00 - 0.04 K/UL    Differential Type Smear Scanned      RBC Comment Anisocytosis  1+        RBC Comment Microcytosis  2+       HCG, Quantitative, Pregnancy    Collection Time: 04/08/24 10:50 AM   Result Value Ref Range    hCG Quant 29,140 mIU/mL   TYPE AND SCREEN    Collection Time: 04/08/24 10:50 AM   Result Value Ref Range    Crossmatch expiration date 04/11/2024,2359     ABO/Rh O Positive     Antibody Screen Negative    BMP    Collection Time: 04/08/24 10:50 AM   Result Value Ref Range    Sodium 139 136 - 145 mmol/L    Potassium 3.4 (L) 3.5 - 5.1 mmol/L    Chloride 105 98 - 107 mmol/L    CO2 22 20 - 29 mmol/L    Anion Gap 12 2 - 14 mmol/L    Glucose 83 65 - 100 mg/dL    BUN 8 6 - 20 mg/dL    Creatinine 9.33 9.39 - 1.00 mg/dL    BUN/Creatinine Ratio 12 12 - 20      Est, Glom Filt Rate >90 >59 ml/min/1.56m2    Calcium 8.6 8.6 - 10.0 mg/dL   Urinalysis with Microscopic    Collection Time: 04/08/24 12:17 PM   Result Value Ref Range    Color, UA Yellow/Straw      Appearance Turbid (A) Clear       Specific Gravity, UA 1.024 1.003 - 1.030  pH, Urine 5.0 5.0 - 8.0      Protein, UA Negative Negative mg/dL    Glucose, Ur Negative Negative mg/dL    Ketones, Urine 5 (A) Negative mg/dL    Bilirubin, Urine Negative Negative      Blood, Urine Moderate (A) Negative      Urobilinogen, Urine 0.1 0.1 - 1.0 EU/dL    Nitrite, Urine Negative Negative      Leukocyte Esterase, Urine Trace (A) Negative      WBC, UA 0-4 0 - 4 /hpf    RBC, UA 0-5 0 - 5 /hpf    Epithelial Cells, UA Many (A) Few /lpf    BACTERIA, URINE Negative Negative /hpf    Mucus, UA Trace (A) Negative /lpf       EKG: Initial EKG interpreted by me if performed. See ED course below    Radiologic Studies:  Non-plain film images such as CT, Ultrasound and MRI are read by the radiologist. Plain radiographic images are visualized and preliminarily interpreted by the ED Provider with findings available in ED course below.     Interpretation per the Radiologist below, if available at the time of this note:  US  OB TRANSVAGINAL   Final Result   Single intrauterine gestational sac with yolk sac but no fetal pole   or cardiac motion. Gestational sac size suggests gestational age of [redacted] weeks 6   days. Viable intrauterine gestation is not confirmed, and follow-up obstetric   ultrasonography will be necessary. Normal right ovary.         Electronically signed by Berle Comfort           ED COURSE and DIFFERENTIAL DIAGNOSIS/MDM   1:16 PM Differential, Considerations of tests not ordered and ED Course:     ED Course as of 04/08/24 1316   Mon Apr 08, 2024   1061 MDM: 43 year old female presents for evaluation of vaginal bleeding in early pregnancy.  Physical exam benign, concern for threatened miscarriage, urinary tract infection, other sources of pregnancy related bleeding.  Will evaluate with CBC and metabolic panel for evidence of leukocytosis, anemia, electrolyte abnormality, dehydration, renal dysfunction.  UA to evaluate urinary tract infection, sending hCG, fetal  ultrasound ordered [BQ]   1152 HCG, Beta: 29,140 [BQ]   1315 US  OB TRANSVAGINAL  IMPRESSION:  Single intrauterine gestational sac with yolk sac but no fetal pole  or cardiac motion. Gestational sac size suggests gestational age of [redacted] weeks 6  days. Viable intrauterine gestation is not confirmed, and follow-up obstetric  ultrasonography will be necessary. Normal right ovary.      [BQ]   1315 Given ultrasound results, concern for early pregnancy versus inevitable miscarriage, patient to return in 48 hours for repeat ultrasound and hCG [BQ]      ED Course User Index  [BQ] Brain Morene Gull, MD       Records Reviewed: Prior medical records and Nursing notes. See ED Course for summary.   Vitals:    Vitals:    04/08/24 1023 04/08/24 1030 04/08/24 1227   BP: (!) 118/91  (!) 122/90   Pulse: 81 76 81   Resp: 18 16 16    Temp: 99 F (37.2 C) 98.5 F (36.9 C)    TempSrc:  Oral    SpO2: 99% 99% 99%   Weight: 78 kg (172 lb)     Height: 1.753 m (5' 9)          Clinical Management Tools:  Not Applicable  Smoking Cessation: Not Applicable    Patient was given the following medications:  Medications - No data to display    CONSULTS: See ED Course/MDM for further details.  None   PROCEDURES   Unless otherwise noted above, none  Procedures      SEPSIS REASSESSMENT & CRITICAL CARE TIME   SEPSIS REASSESSMENT: No suspicion of bacterial infection and not having 2 SIRS during this visit.    Patient does not meet Critical Care Time, 0 minutes  CLINICAL IMPRESSIONS     1. Threatened miscarriage         SDOH/DISPOSITION/PLAN   Social Determinants affecting Treatment Plan: Patient lacks a PCP. Given PCP/Free clinic resources.    DISPOSITION Decision To Discharge 04/08/2024 01:10:47 PM   DISPOSITION CONDITION Stable         Discharge Note: The patient is stable for discharge home. The signs, symptoms, diagnosis, and discharge instructions have been discussed, understanding conveyed, and agreed upon. The patient is to follow up as  recommended or return to ER should their symptoms worsen.      PATIENT REFERRED TO:  [facility]  [address] [place]  [phone]  [phone]  In 2 days          DISCHARGE MEDICATIONS:     Medication List        ASK your doctor about these medications      acetaminophen  500 MG tablet  Commonly known as: TYLENOL   Take 1 tablet by mouth every 6 hours as needed for Pain     * albuterol  sulfate HFA 108 (90 Base) MCG/ACT inhaler  Commonly known as: Ventolin  HFA  Inhale 2 puffs into the lungs 4 times daily as needed for Wheezing     * albuterol  (5 MG/ML) 0.5% nebulizer solution  Commonly known as: PROVENTIL   Take 0.5 mLs by nebulization every 6 hours as needed for Wheezing     azithromycin  250 MG tablet  Commonly known as: Zithromax  Z-Pak  Take 2 tablets (500 mg) on Day 1, and then take 1 tablet (250 mg) on days 2 through 5.     docusate sodium  100 MG capsule  Commonly known as: Colace  Take 1 capsule by mouth 2 times daily     DULoxetine  60 MG extended release capsule  Commonly known as: CYMBALTA   Take 1 capsule by mouth daily     hydrocortisone  1 % cream  Commonly known as: Ala-Cort   Apply topically 2 times daily.     ibuprofen  600 MG tablet  Commonly known as: IBU  Take 1 tablet by mouth every 6 hours as needed for Pain     ketoconazole  2 % cream  Commonly known as: NIZORAL   Apply topically daily.     loratadine  10 MG tablet  Commonly known as: Claritin   Take 1 tablet by mouth daily     miSOPROStol  100 MCG tablet  Commonly known as: Cytotec   Take 4 tablets by mouth 4 times daily for 3 days     nitrofurantoin (macrocrystal-monohydrate) 100 MG capsule  Commonly known as: MACROBID     ondansetron  4 MG disintegrating tablet  Commonly known as: ZOFRAN -ODT  Place 1 tablet under the tongue every 8 hours as needed for Nausea or Vomiting     Prenatal Vitamin 27-0.8 MG Tabs  Take 1 tablet by mouth daily     risperiDONE  1 MG tablet  Commonly known as: RISPERDAL   Take 1 tablet  by mouth 2 times daily  traZODone  100 MG tablet  Commonly known as: DESYREL   Take 1.5 tablets by mouth nightly           * This list has 2 medication(s) that are the same as other medications prescribed for you. Read the directions carefully, and ask your doctor or other care provider to review them with you.                    DISCONTINUED MEDICATIONS:  Current Discharge Medication List          I am the Primary Clinician of Record. Morene Okey Huh, MD (electronically signed)    (Please note that parts of this dictation were completed with voice recognition software. Quite often unanticipated grammatical, syntax, homophones, and other interpretive errors are inadvertently transcribed by the computer software. Please disregards these errors. Please excuse any errors that have escaped final proofreading.)        Huh Morene Okey, MD  04/08/24 1316    "

## 2024-04-10 ENCOUNTER — Emergency Department: Admit: 2024-04-10 | Payer: Medicare (Managed Care)

## 2024-04-10 ENCOUNTER — Inpatient Hospital Stay
Admit: 2024-04-10 | Discharge: 2024-04-10 | Disposition: A | Discharge status: Home / Self Care | Payer: Medicare (Managed Care) | Arrived: VH

## 2024-04-10 DIAGNOSIS — O23599 Infection of other part of genital tract in pregnancy, unspecified trimester: Secondary | ICD-10-CM

## 2024-04-10 DIAGNOSIS — O039 Complete or unspecified spontaneous abortion without complication: Principal | ICD-10-CM

## 2024-04-10 LAB — URINALYSIS WITH REFLEX TO CULTURE
BACTERIA, URINE: NEGATIVE /HPF
Bilirubin, Urine: NEGATIVE
Blood, Urine: NEGATIVE
Glucose, Ur: NEGATIVE mg/dL
Ketones, Urine: NEGATIVE mg/dL
Nitrite, Urine: NEGATIVE
Protein, UA: NEGATIVE mg/dL
Specific Gravity, UA: 1.019 (ref 1.003–1.030)
Urobilinogen, Urine: 0.1 EU/dL (ref 0.1–1.0)
pH, Urine: 7 (ref 5.0–8.0)

## 2024-04-10 LAB — WET PREP, GENITAL
Trich, Wet Prep: NONE SEEN
Yeast, Wet Prep: NONE SEEN

## 2024-04-10 LAB — HCG, QUANTITATIVE, PREGNANCY: hCG Quant: 31716 m[IU]/mL

## 2024-04-10 MED ORDER — HYDROCODONE-ACETAMINOPHEN 5-325 MG PO TABS
5-325 | ORAL_TABLET | Freq: Four times a day (QID) | ORAL | 0 refills | Status: AC | PRN
Start: 2024-04-10 — End: 2024-04-13

## 2024-04-10 MED ORDER — MISOPROSTOL 200 MCG PO TABS
200 | ORAL_TABLET | ORAL | 0 refills | Status: AC
Start: 2024-04-10 — End: ?

## 2024-04-10 MED ORDER — IBUPROFEN 800 MG PO TABS
800 | ORAL_TABLET | Freq: Two times a day (BID) | ORAL | 0 refills | 15.00000 days | Status: DC | PRN
Start: 2024-04-10 — End: 2024-04-14

## 2024-04-10 MED ORDER — METRONIDAZOLE 500 MG PO TABS
500 | ORAL_TABLET | Freq: Two times a day (BID) | ORAL | 0 refills | Status: AC
Start: 2024-04-10 — End: 2024-04-17

## 2024-04-10 MED ORDER — ONDANSETRON 4 MG PO TBDP
4 | ORAL_TABLET | Freq: Three times a day (TID) | ORAL | 0 refills | 7.00000 days | Status: DC | PRN
Start: 2024-04-10 — End: 2024-04-14

## 2024-04-10 NOTE — ED Triage Notes (Signed)
"  Pt arrives to ED with family. Pt told to come back today for repeat US  and HCG draw. Pt denies bleeding, reports cramping.   "

## 2024-04-10 NOTE — Discharge Instructions (Signed)
 "    Thank you for choosing our Emergency Department for your care.  It is our privilege to care for you in your time of need.  In the next several days, you may receive a survey via email or mailed to your home about your experience with our team.  We would greatly appreciate you taking a few minutes to complete the survey, as we use this information to learn what we have done well and what we could be doing better. Thank you for trusting us  with your care!    Below you will find a list of your tests from today's visit.   Labs and Radiology Studies  Recent Results (from the past 12 hours)   HCG, Quantitative, Pregnancy    Collection Time: 04/10/24 10:57 AM   Result Value Ref Range    hCG Quant 31,716 mIU/mL   Wet prep, genital    Collection Time: 04/10/24 11:07 AM    Specimen: Miscellaneous sample   Result Value Ref Range    Clue Cells, Wet Prep Clue Cells present (A) NONE SEEN      Trich, Wet Prep NONE SEEN NONE SEEN      Yeast, Wet Prep NONE SEEN NONE SEEN     Urinalysis with Reflex to Culture    Collection Time: 04/10/24 11:07 AM    Specimen: Urine   Result Value Ref Range    Color, UA Yellow/Straw      Appearance Turbid (A) Clear      Specific Gravity, UA 1.019 1.003 - 1.030      pH, Urine 7.0 5.0 - 8.0      Protein, UA Negative Negative mg/dL    Glucose, Ur Negative Negative mg/dL    Ketones, Urine Negative Negative mg/dL    Bilirubin, Urine Negative Negative      Blood, Urine Negative Negative      Urobilinogen, Urine 0.1 0.1 - 1.0 EU/dL    Nitrite, Urine Negative Negative      Leukocyte Esterase, Urine Trace (A) Negative      WBC, UA 0-4 0 - 4 /hpf    RBC, UA 0-5 0 - 5 /hpf    Epithelial Cells, UA Many (A) Few /lpf    BACTERIA, URINE Negative Negative /hpf    Urine Culture if Indicated Culture not indicated by UA result Culture not indicated by UA result      Mucus, UA Trace (A) Negative /lpf     US  OB TRANSVAGINAL  Result Date: 04/10/2024  EXAM:  US  OB TRANSVAGINAL INDICATION:   First US  no heart beat  COMPARISON: 04/08/2024. FINDINGS: Transvaginal sonography was performed with multiple static images of the uterus and ovaries obtained. The uterus measures 10.1 x 7.3 x 8.2 cm and has a cervix that measures 4.3 cm in length which contains a small amount of fluid. In the myometrium, there is a hypoechoic 9 x 9 x 8 mm structure. Within the endometrium, there is a gestational sac that contains a yolk sac. The gestational sac measures 2.2 x 1.8 x 1.6 cm. Fetal pole, placenta and heartbeat are not identified. The gestational sac corresponds to 6-week 5-day pregnancy. The right ovary measures 3.2 x 2.6 x 2.9 cm and has normal flow. The left ovary is not identified. There is no fluid or mass or other abnormality in the pelvic cul-de-sac.     Single intrauterine pregnancy. Viability is not established. Possible blighted ovum. Normal right ovary Nonvisualization of the left ovary. Electronically signed by Devere Cal    ------------------------------------------------------------------------------------------------------------  The evaluation and treatment you received in the Emergency Department were for an urgent problem. It is important that you follow-up with a doctor, nurse practitioner, or physician assistant to:  (1) confirm your diagnosis,  (2) re-evaluation of changes in your illness and treatment, and (3) for ongoing care. Please take your discharge instructions with you when you go to your follow-up appointment.     If you have any problem arranging a follow-up appointment, contact us !  If your symptoms become worse or you do not improve as expected, please return to us . We are available 24 hours a day.     If a prescription has been provided, please fill it as soon as possible to prevent a delay in treatment. If you have any questions or reservations about taking the medication due to side effects or interactions with other medications, please call your primary care provider or contact us  directly.  Again, THANK  YOU for choosing us  to care for YOU!   "

## 2024-04-10 NOTE — ED Provider Notes (Signed)
 "[facility]  EMERGENCY DEPARTMENT HISTORY AND PHYSICAL EXAM      Date of evaluation: 04/10/2024  Patient Name: Connie Morgan  Birthdate 02-24-1981  MRN: [id number]  ED Provider: Maurilio Kitty, PA-C   Note Started: 11:13 AM EST 04/10/24    HISTORY OF PRESENT ILLNESS     Chief Complaint   Patient presents with    Pregnancy Problem       History Provided By: Patient, only     HPI: Connie Morgan is a 43 y.o. female with past med history as listed below presents to the ED for a follow-up transvaginal ultrasound and repeat quantitative beta-hCG levels.  Patient was evaluated 2 days prior on 04/09/2019 for vaginal bleeding during pregnancy.  At that time her beta-hCG level was 29,140 and a transvaginal ultrasound showed a 6-week and 6-day old fetus but no fetal pole or cardiac motion.  Patient states she has not been experiencing the vaginal bleeding but does report occasional cramping.  She also states that she is experiencing a milky discharge.  Denies fever, chills, chest pain, shortness of breath, abdominal pain, changes in urination or changes in stool    PAST MEDICAL HISTORY   Past Medical History:  Past Medical History:   Diagnosis Date    Asthma     Bleeding hemorrhoids     Depression        Past Surgical History:  No past surgical history on file.    Family History:  Family History   Problem Relation Age of Onset    No Known Problems Mother     No Known Problems Father        Social History:  Social History     Tobacco Use    Smoking status: Former     Current packs/day: 0.25     Types: Cigarettes    Smokeless tobacco: Never    Tobacco comments:     Quit smoking: 1-2 cigarettes a day   Vaping Use    Vaping status: Never Used   Substance Use Topics    Alcohol use: Never    Drug use: No       Allergies:  No Known Allergies    PCP: None, None    Current Meds:   No current facility-administered medications for this encounter.     Current Outpatient Medications   Medication Sig Dispense Refill    miSOPROStol  (CYTOTEC )  200 MCG tablet Take 4 tablets by mouth. Repeat in 12 hours. 8 tablet 0    ibuprofen  (ADVIL ;MOTRIN ) 800 MG tablet Take 1 tablet by mouth 2 times daily as needed for Pain 20 tablet 0    ondansetron  (ZOFRAN -ODT) 4 MG disintegrating tablet Take 1 tablet by mouth 3 times daily as needed for Nausea or Vomiting 21 tablet 0    HYDROcodone -acetaminophen  (NORCO) 5-325 MG per tablet Take 1 tablet by mouth every 6 hours as needed for Pain for up to 3 days. Intended supply: 3 days. Take lowest dose possible to manage pain Max Daily Amount: 4 tablets 10 tablet 0    metroNIDAZOLE  (FLAGYL ) 500 MG tablet Take 1 tablet by mouth 2 times daily for 7 days 14 tablet 0    nitrofurantoin, macrocrystal-monohydrate, (MACROBID) 100 MG capsule Take 1 capsule by mouth every 12 hours      loratadine  (CLARITIN ) 10 MG tablet Take 1 tablet by mouth daily (Patient not taking: Reported on 04/08/2024) 30 tablet 0    albuterol  sulfate HFA (VENTOLIN  HFA) 108 (90 Base) MCG/ACT inhaler  Inhale 2 puffs into the lungs 4 times daily as needed for Wheezing (Patient not taking: Reported on 04/08/2024) 1 each 0    albuterol  (PROVENTIL ) (5 MG/ML) 0.5% nebulizer solution Take 0.5 mLs by nebulization every 6 hours as needed for Wheezing (Patient not taking: Reported on 04/08/2024) 120 each 0    acetaminophen  (TYLENOL ) 500 MG tablet Take 1 tablet by mouth every 6 hours as needed for Pain 20 tablet 0    azithromycin  (ZITHROMAX  Z-PAK) 250 MG tablet Take 2 tablets (500 mg) on Day 1, and then take 1 tablet (250 mg) on days 2 through 5. (Patient not taking: Reported on 04/08/2024) 1 packet 0    ketoconazole  (NIZORAL ) 2 % cream Apply topically daily. (Patient not taking: Reported on 04/08/2024) 30 g 0    DULoxetine  (CYMBALTA ) 60 MG extended release capsule Take 1 capsule by mouth daily (Patient not taking: Reported on 04/08/2024) 30 capsule 1    traZODone  (DESYREL ) 100 MG tablet Take 1.5 tablets by mouth nightly (Patient not taking: Reported on 04/08/2024) 30 tablet 1     risperiDONE  (RISPERDAL ) 1 MG tablet Take 1 tablet by mouth 2 times daily (Patient not taking: Reported on 04/08/2024) 60 tablet 1    hydrocortisone  (ALA-CORT ) 1 % cream Apply topically 2 times daily. (Patient not taking: Reported on 04/08/2024) 30 g 1    Prenatal Vit-Fe Fumarate-FA (PRENATAL VITAMIN) 27-0.8 MG TABS Take 1 tablet by mouth daily (Patient not taking: Reported on 04/08/2024) 30 tablet 0    docusate sodium  (COLACE) 100 MG capsule Take 1 capsule by mouth 2 times daily (Patient not taking: Reported on 04/08/2024) 30 capsule 0       Social Determinants of Health:   Social Drivers of Health     Tobacco Use: Medium Risk (06/03/2023)    Patient History     Smoking Tobacco Use: Former     Smokeless Tobacco Use: Never     Passive Exposure: Not on file   Alcohol Use: Not At Risk (04/10/2024)    AUDIT-C     Frequency of Alcohol Consumption: Never     Average Number of Drinks: Patient does not drink     Frequency of Binge Drinking: Never   Financial Resource Strain: Not on file   Food Insecurity: Food Insecurity Present (02/10/2023)    Hunger Vital Sign     Worried About Radiation Protection Practitioner of Food in the Last Year: Never true     Ran Out of Food in the Last Year: Sometimes true   Transportation Needs: No Transportation Needs (02/10/2023)    PRAPARE - Therapist, Art (Medical): No     Lack of Transportation (Non-Medical): No   Physical Activity: Not on file   Stress: Not on file   Social Connections: Not on file   Intimate Partner Violence: Not on file   Depression: Not on file   Housing Stability: Low Risk  (02/10/2023)    Housing Stability Vital Sign     Unable to Pay for Housing in the Last Year: No     Number of Times Moved in the Last Year: 0     Homeless in the Last Year: No   Interpersonal Safety: Not At Risk (07/17/2023)    Interpersonal Safety Domain Source: IP Abuse Screening     Physical abuse: Denies     Verbal abuse: Denies     Emotional abuse: Denies     Financial abuse: Denies     Sexual  abuse:  Denies   Utilities: Not At Risk (02/10/2023)    [facility] Utilities     Threatened with loss of utilities: No     PHYSICAL EXAM   Physical Exam  Vitals reviewed.   Constitutional:       Appearance: Normal appearance. She is not ill-appearing or toxic-appearing.   HENT:      Head: Normocephalic and atraumatic.   Eyes:      Extraocular Movements: Extraocular movements intact.      Conjunctiva/sclera: Conjunctivae normal.      Pupils: Pupils are equal, round, and reactive to light.   Cardiovascular:      Rate and Rhythm: Normal rate and regular rhythm.   Pulmonary:      Effort: Pulmonary effort is normal.      Breath sounds: Normal breath sounds.   Abdominal:      General: Abdomen is flat.      Palpations: Abdomen is soft.      Tenderness: There is no abdominal tenderness. There is no guarding or rebound.   Musculoskeletal:         General: Normal range of motion.      Cervical back: Normal range of motion.   Skin:     General: Skin is warm.   Neurological:      Mental Status: She is alert and oriented to person, place, and time.   Psychiatric:         Behavior: Behavior normal.       SCREENINGS                No data recorded       LAB, EKG AND DIAGNOSTIC RESULTS   Labs:  Recent Results (from the past 12 hours)   HCG, Quantitative, Pregnancy    Collection Time: 04/10/24 10:57 AM   Result Value Ref Range    hCG Quant 31,716 mIU/mL   Wet prep, genital    Collection Time: 04/10/24 11:07 AM    Specimen: Miscellaneous sample   Result Value Ref Range    Clue Cells, Wet Prep Clue Cells present (A) NONE SEEN      Trich, Wet Prep NONE SEEN NONE SEEN      Yeast, Wet Prep NONE SEEN NONE SEEN     Urinalysis with Reflex to Culture    Collection Time: 04/10/24 11:07 AM    Specimen: Urine   Result Value Ref Range    Color, UA Yellow/Straw      Appearance Turbid (A) Clear      Specific Gravity, UA 1.019 1.003 - 1.030      pH, Urine 7.0 5.0 - 8.0      Protein, UA Negative Negative mg/dL    Glucose, Ur Negative Negative mg/dL    Ketones, Urine  Negative Negative mg/dL    Bilirubin, Urine Negative Negative      Blood, Urine Negative Negative      Urobilinogen, Urine 0.1 0.1 - 1.0 EU/dL    Nitrite, Urine Negative Negative      Leukocyte Esterase, Urine Trace (A) Negative      WBC, UA 0-4 0 - 4 /hpf    RBC, UA 0-5 0 - 5 /hpf    Epithelial Cells, UA Many (A) Few /lpf    BACTERIA, URINE Negative Negative /hpf    Urine Culture if Indicated Culture not indicated by UA result Culture not indicated by UA result      Mucus, UA Trace (A) Negative /lpf  EKG:.Not Applicable     Radiologic Studies:  Radiographic images are visualized and preliminarily interpreted by the ED Provider with the following findings: See ED Course Below.     Interpretation per the Radiologist below, if available at the time of this note:  US  OB TRANSVAGINAL   Final Result   Single intrauterine pregnancy. Viability is not established. Possible blighted   ovum.   Normal right ovary   Nonvisualization of the left ovary.                        Electronically signed by Devere Cal           Records Reviewed: Not Applicable    MEDICAL DECISION MAKING and ED COURSE   6:35 PM Differential and Considerations of tests not ordered: 43 y.o. female with past med history as listed below presents to the ED for a follow-up transvaginal ultrasound and repeat quantitative beta-hCG levels.  Patient was evaluated 2 days prior on 04/09/2019 for vaginal bleeding during pregnancy.  At that time her beta-hCG level was 29,140 and a transvaginal ultrasound showed a 6-week and 6-day old fetus but no fetal pole or cardiac motion.  Patient states she has not been experiencing the vaginal bleeding but does report occasional cramping.  She also states that she is experiencing a milky discharge.  Denies fever, chills, chest pain, shortness of breath, abdominal pain, changes in urination or changes in stool    Patient is well-appearing.  Physical exam shows no notable abnormalities.  No suprapubic or lower abdominal  tenderness.  No guarding or rebound.  Will order a transvaginal ultrasound and repeat quant hCG levels.  Through shared decision making, we will also order a wet prep to evaluate for yeast, BV, and trichomonas as well as gonorrhea and Chlamydia testing given the milky discharge.  Will also order UA to evaluate for UTI.  I did consider repeating a CBC and BMP today, however the patient is no longer experiencing vaginal bleeding and is hemodynamically stable.     Vitals:    Vitals:    04/10/24 1023 04/10/24 1306   BP: 107/75 121/84   Pulse: 73 74   Resp: 14 19   Temp: 98.4 F (36.9 C) 98.4 F (36.9 C)   TempSrc: Oral Oral   SpO2: 100% 100%   Weight: 79.4 kg (175 lb)    Height: 1.753 m (5' 9)        ED COURSE  ED Course as of 04/10/24 1835   Wed Apr 10, 2024   1301 Discussed patient case with [EP]   1305 Urinalysis with Reflex to Culture(!):    Color, UA Yellow/Straw   Appearance Turbid(!)   Specific Gravity, UA 1.019   pH, Urine 7.0   Protein, UA Negative   Glucose, Ur Negative   Ketones, Urine Negative   Bilirubin, Urine Negative   Blood, Urine Negative   Urobilinogen 0.1   Nitrite, Urine Negative   Leukocyte Esterase, Urine Trace(!)   WBC, UA 0-4   RBC, UA 0-5   Epithelial Cells, UA Many(!)   Bacteria, UA Negative   Urine Culture if Indicated Culture not indicated by UA result   Mucus, UA Trace(!)  Not concerning for UTI. [EP]   1305 Wet prep, genital(!):    Clue Cells, Wet Prep Clue Cells present(!)   Trich, Wet Prep NONE SEEN   Yeast, Wet Prep NONE SEEN  Positive for clue cells.  Will treat with metronidazole . [  EP]   1305 HCG, Beta: 31,716  Increased from 2 days prior however not substantial. [EP]   1305 US  OB TRANSVAGINAL  Ultrasound is concerning for a nonviable pregnancy and blighted ovum.  Ultrasound 2 days prior also shows no fetal cardiac activity. Given the concerning ultrasound findings of a miscarriage, I discussed patient case with on-call OB/GYN Dr. Cordella Ada. He recommended Cytotec  800 mcg once  and again in 12 hours.  He also recommended adequate pain management.  I will call in ibuprofen , Norco, and Zofran  for nausea.    I discussed extensively with the patient the option of Cytotec  versus expectant management.  Patient elected for Cytotec  today.  Will send in the prescription to her pharmacy.  I provided her information regarding the medication.  I also stressed the importance of follow-up with an OB/GYN over the next 1 to 2 days.  Will give a referral today.    Routine discharge counseling was given including the fact that any worsening, changing or persistent symptoms should prompt an immediate call or follow up with their primary physician or the emergency department. The importance of appropriate follow up was also discussed. More extensive discharge instructions were given in the patient's discharge paperwork.     After completion of evaluation and discussion of results and diagnoses, all the questions were answered. All follow up appointments and treatments were discussed and explained.    [EP]      ED Course User Index  [EP] Kennyth Herring, PA-C       Clinical Management Tools:  Not Applicable    Smoking Cessation: Not Applicable    Patient was given the following medications:  Medications - No data to display    CONSULTS: See ED Course/MDM for further details.  None     PROCEDURES   See ED course/ MDM for any additional procedures.  Procedures      SEPSIS REASSESSMENT & CRITICAL CARE TIME   SEPSIS REASSESSMENT: No suspicion of bacterial infection and not having 2 SIRS during this visit.    Patient does not meet Critical Care Time, 0 minutes  CLINICAL IMPRESSIONS     1. Miscarriage    2. Bacterial vaginosis       SDOH/DISPOSITION/PLAN   Social Determinants affecting Treatment Plan: None    DISPOSITION Decision To Discharge 04/10/2024 01:00:44 PM   DISPOSITION CONDITION Stable         Discharge Note: The patient is stable for discharge home. The signs, symptoms, diagnosis, and discharge instructions  have been discussed, understanding conveyed, and agreed upon. The patient is to follow up as recommended or return to ER should their symptoms worsen.      PATIENT REFERRED TO:  Evorn Garrison SQUIBB, MD  [address]  [address]  [place] [address]  [phone]    Schedule an appointment as soon as possible for a visit in 1 day  [facility]    [facility]  [address]  [place]  [phone]  [phone]    If symptoms worsen        DISCHARGE MEDICATIONS:     Medication List        START taking these medications      HYDROcodone -acetaminophen  5-325 MG per tablet  Commonly known as: NORCO  Take 1 tablet by mouth every 6 hours as needed for Pain for up to 3 days. Intended supply: 3 days. Take lowest dose possible to manage pain Max Daily Amount: 4 tablets  metroNIDAZOLE  500 MG tablet  Commonly known as: FLAGYL   Take 1 tablet by mouth 2 times daily for 7 days            CHANGE how you take these medications      ibuprofen  800 MG tablet  Commonly known as: ADVIL ;MOTRIN   Take 1 tablet by mouth 2 times daily as needed for Pain  What changed:   medication strength  how much to take  when to take this     miSOPROStol  200 MCG tablet  Commonly known as: CYTOTEC   Take 4 tablets by mouth. Repeat in 12 hours.  What changed:   medication strength  how much to take  how to take this  when to take this  additional instructions     ondansetron  4 MG disintegrating tablet  Commonly known as: ZOFRAN -ODT  Take 1 tablet by mouth 3 times daily as needed for Nausea or Vomiting  What changed:   how to take this  when to take this            ASK your doctor about these medications      acetaminophen  500 MG tablet  Commonly known as: TYLENOL   Take 1 tablet by mouth every 6 hours as needed for Pain     * albuterol  sulfate HFA 108 (90 Base) MCG/ACT inhaler  Commonly known as: Ventolin  HFA  Inhale 2 puffs into the lungs 4 times daily as needed for Wheezing     * albuterol  (5 MG/ML) 0.5%  nebulizer solution  Commonly known as: PROVENTIL   Take 0.5 mLs by nebulization every 6 hours as needed for Wheezing     azithromycin  250 MG tablet  Commonly known as: Zithromax  Z-Pak  Take 2 tablets (500 mg) on Day 1, and then take 1 tablet (250 mg) on days 2 through 5.     docusate sodium  100 MG capsule  Commonly known as: Colace  Take 1 capsule by mouth 2 times daily     DULoxetine  60 MG extended release capsule  Commonly known as: CYMBALTA   Take 1 capsule by mouth daily     hydrocortisone  1 % cream  Commonly known as: Ala-Cort   Apply topically 2 times daily.     ketoconazole  2 % cream  Commonly known as: NIZORAL   Apply topically daily.     loratadine  10 MG tablet  Commonly known as: Claritin   Take 1 tablet by mouth daily     nitrofurantoin (macrocrystal-monohydrate) 100 MG capsule  Commonly known as: MACROBID     Prenatal Vitamin 27-0.8 MG Tabs  Take 1 tablet by mouth daily     risperiDONE  1 MG tablet  Commonly known as: RISPERDAL   Take 1 tablet by mouth 2 times daily     traZODone  100 MG tablet  Commonly known as: DESYREL   Take 1.5 tablets by mouth nightly           * This list has 2 medication(s) that are the same as other medications prescribed for you. Read the directions carefully, and ask your doctor or other care provider to review them with you.                   Where to Get Your Medications        These medications were sent to [facility] [address], [place] - [address] - P [phone] [place] [id number]  [address], [place] [address]      Phone: [phone]   HYDROcodone -acetaminophen  5-325 MG  per tablet  ibuprofen  800 MG tablet  metroNIDAZOLE  500 MG tablet  miSOPROStol  200 MCG tablet  ondansetron  4 MG disintegrating tablet           DISCONTINUED MEDICATIONS:  Discharge Medication List as of 04/10/2024  1:07 PM          I am the Primary Clinician of Record. Maurilio Kitty, PA-C (electronically signed)    (Please note that parts of this dictation were completed with voice recognition  software. Quite often unanticipated grammatical, syntax, homophones, and other interpretive errors are inadvertently transcribed by the computer software. Please disregards these errors. Please excuse any errors that have escaped final proofreading.)      Kitty Maurilio, PA-C  04/10/24 1836    "

## 2024-04-11 LAB — C.TRACHOMATIS N.GONORRHOEAE DNA
C. Trachomatis Amplified: NEGATIVE
N. Gonorrhoeae Amplified: NEGATIVE

## 2024-04-14 ENCOUNTER — Inpatient Hospital Stay
Admit: 2024-04-14 | Discharge: 2024-04-15 | Disposition: A | Discharge status: Home / Self Care | Payer: Medicare (Managed Care) | Arrived: AM | Attending: Emergency Medicine

## 2024-04-14 ENCOUNTER — Emergency Department: Admit: 2024-04-15 | Payer: Medicare (Managed Care)

## 2024-04-14 DIAGNOSIS — O039 Complete or unspecified spontaneous abortion without complication: Secondary | ICD-10-CM

## 2024-04-14 MED ORDER — ONDANSETRON HCL 4 MG/2ML IJ SOLN
4 | Freq: Once | INTRAMUSCULAR | Status: AC
Start: 2024-04-14 — End: 2024-04-14
  Administered 2024-04-15: 4 mg via INTRAVENOUS

## 2024-04-14 MED ORDER — HYDROMORPHONE HCL PF 1 MG/ML IJ SOLN
1 | INTRAMUSCULAR | Status: AC
Start: 2024-04-14 — End: 2024-04-14
  Administered 2024-04-15: 1 mg via INTRAVENOUS

## 2024-04-14 NOTE — ED Provider Notes (Signed)
 "[facility]  EMERGENCY DEPARTMENT HISTORY AND PHYSICAL EXAM      Date of evaluation: 04/14/2024  Patient Name: Connie Morgan  Birthdate September 04, 1980  MRN: [id number]  ED Provider: Cinderella LILLETTE Daria Rosey, MD   Note Started: 7:19 AM EST 04/18/24  Chief Complaint:   Chief Complaint   Patient presents with    Abdominal Pain    Vaginal Bleeding       HISTORY OF PRESENT ILLNESS   History Provided By: Patient, only     HPI: Connie Morgan is a 43 y.o. female who comes to the ED for evaluation of abdominal pain.  Patient was seen here on 04/10/2024 for a pregnancy related issue in which the patient eventually was discussed with the patient finally decision was made to medically induced miscarriage.  She has been having cramping and passage of blood clots.  Denies any urinary symptoms.  She is feeling nauseous but no active vomiting.  She is here for symptomatic control    PAST MEDICAL HISTORY   Past Medical History:  Past Medical History:   Diagnosis Date    Asthma     Bleeding hemorrhoids     Depression        Past Surgical History:  No past surgical history on file.    Family History:  Family History   Problem Relation Age of Onset    No Known Problems Mother     No Known Problems Father        Social History:  Social History     Tobacco Use    Smoking status: Former     Current packs/day: 0.25     Types: Cigarettes    Smokeless tobacco: Never    Tobacco comments:     Quit smoking: 1-2 cigarettes a day   Vaping Use    Vaping status: Never Used   Substance Use Topics    Alcohol use: Defer    Drug use: No       Allergies:  No Known Allergies    PCP: None, None    Current Meds:   No current facility-administered medications for this encounter.     Current Outpatient Medications   Medication Sig Dispense Refill    ketorolac  (TORADOL ) 10 MG tablet Take 1 tablet by mouth every 6 hours as needed for Pain 20 tablet 0    ondansetron  (ZOFRAN -ODT) 4 MG disintegrating tablet Take 1 tablet by mouth 3 times daily as needed for Nausea or  Vomiting 21 tablet 0    miSOPROStol  (CYTOTEC ) 200 MCG tablet Take 4 tablets by mouth. Repeat in 12 hours. 8 tablet 0    nitrofurantoin, macrocrystal-monohydrate, (MACROBID) 100 MG capsule Take 1 capsule by mouth every 12 hours      loratadine  (CLARITIN ) 10 MG tablet Take 1 tablet by mouth daily (Patient not taking: Reported on 04/08/2024) 30 tablet 0    albuterol  sulfate HFA (VENTOLIN  HFA) 108 (90 Base) MCG/ACT inhaler Inhale 2 puffs into the lungs 4 times daily as needed for Wheezing (Patient not taking: Reported on 04/08/2024) 1 each 0    albuterol  (PROVENTIL ) (5 MG/ML) 0.5% nebulizer solution Take 0.5 mLs by nebulization every 6 hours as needed for Wheezing (Patient not taking: Reported on 04/08/2024) 120 each 0    acetaminophen  (TYLENOL ) 500 MG tablet Take 1 tablet by mouth every 6 hours as needed for Pain 20 tablet 0    azithromycin  (ZITHROMAX  Z-PAK) 250 MG tablet Take 2 tablets (500 mg) on Day 1, and then  take 1 tablet (250 mg) on days 2 through 5. (Patient not taking: Reported on 04/08/2024) 1 packet 0    ketoconazole  (NIZORAL ) 2 % cream Apply topically daily. (Patient not taking: Reported on 04/08/2024) 30 g 0    DULoxetine  (CYMBALTA ) 60 MG extended release capsule Take 1 capsule by mouth daily (Patient not taking: Reported on 04/08/2024) 30 capsule 1    traZODone  (DESYREL ) 100 MG tablet Take 1.5 tablets by mouth nightly (Patient not taking: Reported on 04/08/2024) 30 tablet 1    risperiDONE  (RISPERDAL ) 1 MG tablet Take 1 tablet by mouth 2 times daily (Patient not taking: Reported on 04/08/2024) 60 tablet 1    hydrocortisone  (ALA-CORT ) 1 % cream Apply topically 2 times daily. (Patient not taking: Reported on 04/08/2024) 30 g 1    Prenatal Vit-Fe Fumarate-FA (PRENATAL VITAMIN) 27-0.8 MG TABS Take 1 tablet by mouth daily (Patient not taking: Reported on 04/08/2024) 30 tablet 0    docusate sodium  (COLACE) 100 MG capsule Take 1 capsule by mouth 2 times daily (Patient not taking: Reported on 04/08/2024) 30 capsule 0      PHYSICAL EXAM   Physical Exam  Vitals and nursing note reviewed.   Constitutional:       General: She is in acute distress.      Appearance: She is not toxic-appearing or diaphoretic.   HENT:      Head: Normocephalic and atraumatic.   Cardiovascular:      Rate and Rhythm: Normal rate and regular rhythm.   Pulmonary:      Effort: Pulmonary effort is normal.   Abdominal:      Palpations: Abdomen is soft.      Tenderness: There is abdominal tenderness.   Neurological:      Mental Status: She is alert and oriented to person, place, and time.         SCREENINGS                No data recorded       LAB, EKG AND DIAGNOSTIC RESULTS   Labs:  No results found for this or any previous visit (from the past 12 hours).    EKG:.See ED Course Below    Radiologic Studies:  Radiographic images are visualized and preliminarily interpreted by the ED Provider with the following findings: See ED Course Below.     Interpretation per the Radiologist below, if available at the time of this note:  US  PELVIS COMPLETE   Final Result      1. Thickened endometrial stripe which may be secondary to the phase of the   patient's menstrual cycle. Consider follow-up imaging in 10 weeks if clinically   indicated.      2. Probable small hemorrhagic cyst in the right ovary.         Electronically signed by Harden Cahill      US  NON OB TRANSVAGINAL   Final Result      1. Thickened endometrial stripe which may be secondary to the phase of the   patient's menstrual cycle. Consider follow-up imaging in 10 weeks if clinically   indicated.      2. Probable small hemorrhagic cyst in the right ovary.         Electronically signed by Harden Cahill           Records Reviewed: See ED Course    MEDICAL DECISION MAKING and ED COURSE   7:40 AM Differential and Considerations of tests not ordered: Patient is a 43 year old  female comes ED for evaluation of abdominal cramps and vaginal bleeding with passage of clots in the setting of medically induced miscarriage.   Her presentation appears to be consistent with active miscarriage.  I highly doubt her presentation secondary to ectopic pregnancy.  Patient recently was seen here and had STD screening which her chlamydia and gonorrhea is negative.  Her prep was positive for clue cells in which she was prescribed metronidazole .  Plan today is to repeat CBC and chemistry and obtain ultrasound to evaluate for any retained products of conception     Vitals:    Vitals:    04/14/24 1945 04/14/24 1950 04/14/24 1955 04/14/24 2115   BP:    99/76   Pulse:    67   Resp:    18   Temp:    98.7 F (37.1 C)   TempSrc:    Oral   SpO2: 97% 98% 98% 97%   Weight:       Height:           ED COURSE     I independently reviewed interpreted patient workup.  CBC showed no leukocytosis.  She does have mild anemia but no indication for transfusion today and no signs of hemodynamic compromise.  Chemistry did not show evidence of AKI or acute electrolyte derangement.  Ultrasound interpretation without showed thickened limiting swab which may be secondary to her phase of menstrual cycle.  Does not appear that the patient has erythema of possible conception.  Symptoms improved administration of analgesics.  Patient is amenable to discharge with outpatient follow-up.  Anticipatory guidance return precaution discussed.      Clinical Management Tools:  Not Applicable    Smoking Cessation: Not Applicable    Patient was given the following medications:  Medications   HYDROmorphone  HCl PF (DILAUDID ) injection 1 mg (1 mg IntraVENous Given 04/14/24 1920)   ondansetron  (ZOFRAN ) injection 4 mg (4 mg IntraVENous Given 04/14/24 1919)       CONSULTS: See ED Course/MDM for further details.  None     PROCEDURES   See ED course/ MDM for any additional procedures.  Procedures      SEPSIS REASSESSMENT & CRITICAL CARE TIME   SEPSIS REASSESSMENT: No suspicion of bacterial infection and not having 2 SIRS during this visit.    Critical Care: Patient does not meet Critical Care  Time, 0 minutes  CLINICAL IMPRESSIONS     1. Miscarriage       SDOH/DISPOSITION/PLAN   Social Determinants affecting Treatment Plan: None    DISPOSITION Decision To Discharge 04/14/2024 09:25:22 PM   DISPOSITION CONDITION Stable         Discharge Note: The patient is stable for discharge home. The signs, symptoms, diagnosis, and discharge instructions have been discussed, understanding conveyed, and agreed upon. The patient is to follow up as recommended or return to ER should their symptoms worsen.      PATIENT REFERRED TO:  [facility]  [address]  [place]  [phone]  [phone]  Go to   As needed, If symptoms worsen    Your OB/GYN Doctor    Schedule an appointment as soon as possible for a visit   For follow-up, For reevaluation        DISCHARGE MEDICATIONS:     Medication List        START taking these medications      ketorolac  10 MG tablet  Commonly known as: TORADOL   Take 1 tablet  by mouth every 6 hours as needed for Pain            CONTINUE taking these medications      ondansetron  4 MG disintegrating tablet  Commonly known as: ZOFRAN -ODT  Take 1 tablet by mouth 3 times daily as needed for Nausea or Vomiting            ASK your doctor about these medications      acetaminophen  500 MG tablet  Commonly known as: TYLENOL   Take 1 tablet by mouth every 6 hours as needed for Pain     * albuterol  sulfate HFA 108 (90 Base) MCG/ACT inhaler  Commonly known as: Ventolin  HFA  Inhale 2 puffs into the lungs 4 times daily as needed for Wheezing     * albuterol  (5 MG/ML) 0.5% nebulizer solution  Commonly known as: PROVENTIL   Take 0.5 mLs by nebulization every 6 hours as needed for Wheezing     azithromycin  250 MG tablet  Commonly known as: Zithromax  Z-Pak  Take 2 tablets (500 mg) on Day 1, and then take 1 tablet (250 mg) on days 2 through 5.     docusate sodium  100 MG capsule  Commonly known as: Colace  Take 1 capsule by mouth 2 times daily     DULoxetine  60 MG extended  release capsule  Commonly known as: CYMBALTA   Take 1 capsule by mouth daily     hydrocortisone  1 % cream  Commonly known as: Ala-Cort   Apply topically 2 times daily.     ketoconazole  2 % cream  Commonly known as: NIZORAL   Apply topically daily.     loratadine  10 MG tablet  Commonly known as: Claritin   Take 1 tablet by mouth daily     metroNIDAZOLE  500 MG tablet  Commonly known as: FLAGYL   Take 1 tablet by mouth 2 times daily for 7 days  Ask about: Should I take this medication?     miSOPROStol  200 MCG tablet  Commonly known as: CYTOTEC   Take 4 tablets by mouth. Repeat in 12 hours.     nitrofurantoin (macrocrystal-monohydrate) 100 MG capsule  Commonly known as: MACROBID     oxyCODONE  5 MG immediate release tablet  Commonly known as: Roxicodone   Take 1 tablet by mouth every 6 hours as needed for Pain for up to 2 days. Intended supply: 3 days. Take lowest dose possible to manage pain Max Daily Amount: 20 mg  Ask about: Should I take this medication?     Prenatal Vitamin 27-0.8 MG Tabs  Take 1 tablet by mouth daily     risperiDONE  1 MG tablet  Commonly known as: RISPERDAL   Take 1 tablet by mouth 2 times daily     traZODone  100 MG tablet  Commonly known as: DESYREL   Take 1.5 tablets by mouth nightly           * This list has 2 medication(s) that are the same as other medications prescribed for you. Read the directions carefully, and ask your doctor or other care provider to review them with you.                   Where to Get Your Medications        These medications were sent to [facility] [address] [place] - [address] - P [phone] [place] [id number]  [address], [place] [address]      Phone: [phone]   ketorolac  10 MG tablet  ondansetron  4 MG disintegrating  tablet  oxyCODONE  5 MG immediate release tablet           DISCONTINUED MEDICATIONS:  Discharge Medication List as of 04/14/2024  9:29 PM        STOP taking these medications       ibuprofen  (ADVIL ;MOTRIN ) 800 MG tablet Comments:   Reason  for Stopping:               I am the Primary Clinician of Record. Prosper Paff LILLETTE Daria Solo, MD (electronically signed)    (Please note that parts of this dictation were completed with voice recognition software. Quite often unanticipated grammatical, syntax, homophones, and other interpretive errors are inadvertently transcribed by the computer software. Please disregards these errors. Please excuse any errors that have escaped final proofreading.)     Al Hazmi, Malasha Kleppe I, MD  04/18/24 0740    "

## 2024-04-14 NOTE — ED Triage Notes (Signed)
"  Presents with 10/10 abdominal cramping and passing clots  (vaginal)   "

## 2024-04-15 LAB — CBC WITH AUTO DIFFERENTIAL
Basophils %: 0.7 % (ref 0.0–1.0)
Basophils Absolute: 0.03 K/UL (ref 0.00–0.10)
Eosinophils %: 1.4 % (ref 0.0–7.0)
Eosinophils Absolute: 0.06 K/UL (ref 0.00–0.40)
Hematocrit: 27.8 % — ABNORMAL LOW (ref 35.0–47.0)
Hemoglobin: 9.5 g/dL — ABNORMAL LOW (ref 11.5–16.0)
Immature Granulocytes %: 0.2 % (ref 0–0.5)
Immature Granulocytes Absolute: 0.01 K/UL (ref 0.00–0.04)
Lymphocytes %: 33.3 % (ref 12.0–49.0)
Lymphocytes Absolute: 1.4 K/UL (ref 0.80–3.50)
MCH: 24.1 pg — ABNORMAL LOW (ref 26.0–34.0)
MCHC: 34.2 g/dL (ref 30.0–36.5)
MCV: 70.6 FL — ABNORMAL LOW (ref 80.0–99.0)
MPV: 8.6 FL — ABNORMAL LOW (ref 8.9–12.9)
Monocytes %: 12.1 % (ref 5.0–13.0)
Monocytes Absolute: 0.51 K/UL (ref 0.00–1.00)
Neutrophils %: 52.3 % (ref 32.0–75.0)
Neutrophils Absolute: 2.2 K/UL (ref 1.80–8.00)
Nucleated RBCs: 0 /100{WBCs}
Platelets: 303 K/uL (ref 150–400)
RBC: 3.94 M/uL (ref 3.80–5.20)
RDW: 18.4 % — ABNORMAL HIGH (ref 11.5–14.5)
WBC: 4.2 K/uL (ref 3.6–11.0)
nRBC: 0 K/uL (ref 0.00–0.01)

## 2024-04-15 LAB — COMPREHENSIVE METABOLIC PANEL
ALT: 12 U/L (ref 10–35)
AST: 18 U/L (ref 10–35)
Albumin/Globulin Ratio: 0.7 — ABNORMAL LOW (ref 1.1–2.2)
Albumin: 2.9 g/dL — ABNORMAL LOW (ref 3.5–5.2)
Alk Phosphatase: 57 U/L (ref 35–104)
Anion Gap: 9 mmol/L (ref 2–14)
BUN/Creatinine Ratio: 12 (ref 12–20)
BUN: 10 mg/dL (ref 6–20)
CO2: 25 mmol/L (ref 20–29)
Calcium: 8.4 mg/dL — ABNORMAL LOW (ref 8.6–10.0)
Chloride: 104 mmol/L (ref 98–107)
Creatinine: 0.8 mg/dL (ref 0.60–1.00)
Est, Glom Filt Rate: >90 ml/min/1.73m2 (ref >59)
Globulin: 3.9 g/dL (ref 2.0–4.0)
Glucose: 108 mg/dL — ABNORMAL HIGH (ref 65–100)
Potassium: 3.7 mmol/L (ref 3.5–5.1)
Sodium: 138 mmol/L (ref 136–145)
Total Bilirubin: <0.2 mg/dL (ref 0.0–1.2)
Total Protein: 6.8 g/dL (ref 6.4–8.3)

## 2024-04-15 LAB — LIPASE: Lipase: 22 U/L (ref 13–60)

## 2024-04-15 MED ORDER — OXYCODONE HCL 5 MG PO TABS
5 | ORAL_TABLET | Freq: Four times a day (QID) | ORAL | 0 refills | 5.00000 days | Status: AC | PRN
Start: 2024-04-15 — End: 2024-04-16

## 2024-04-15 MED ORDER — KETOROLAC TROMETHAMINE 10 MG PO TABS
10 | ORAL_TABLET | Freq: Four times a day (QID) | ORAL | 0 refills | 5.00000 days | Status: DC | PRN
Start: 2024-04-15 — End: 2024-04-25

## 2024-04-15 MED ORDER — ONDANSETRON 4 MG PO TBDP
4 | ORAL_TABLET | Freq: Three times a day (TID) | ORAL | 0 refills | 7.00000 days | Status: AC | PRN
Start: 2024-04-15 — End: ?

## 2024-04-15 MED FILL — HYDROMORPHONE HCL 1 MG/ML IJ SOLN: 1 mg/mL | INTRAMUSCULAR | Qty: 1 | Fill #0

## 2024-04-15 MED FILL — ONDANSETRON HCL 4 MG/2ML IJ SOLN: 4 MG/2ML | INTRAMUSCULAR | Qty: 2 | Fill #0

## 2024-04-24 DIAGNOSIS — O039 Complete or unspecified spontaneous abortion without complication: Principal | ICD-10-CM

## 2024-04-24 NOTE — ED Provider Notes (Addendum)
 " [facility]  EMERGENCY DEPARTMENT HISTORY AND PHYSICAL EXAM      Date of evaluation: 04/24/2024  Patient Name: Connie Morgan  Birthdate 1981/02/08  MRN: [id number]  ED Provider: Tinnie LELON Roys, MD   Note Started: 9:43 PM EST 04/24/24  Chief Complaint:   Chief Complaint   Patient presents with    Miscarriage       HISTORY OF PRESENT ILLNESS   History Provided By: Patient, only     HPI: Connie Morgan is a 43 y.o. female asthma, depression who presents for evaluation of vaginal bleeding.  Patient is a G15 P6, at about [redacted] weeks pregnant. Passed several large clots here.  Having contraction like pain.  Comes in for management of pain control during miscarriage.    PAST MEDICAL HISTORY   Past Medical History:  Past Medical History:   Diagnosis Date    Asthma     Bleeding hemorrhoids     Depression        Past Surgical History:  No past surgical history on file.    Family History:  Family History   Problem Relation Age of Onset    No Known Problems Mother     No Known Problems Father        Social History:  Social History     Tobacco Use    Smoking status: Former     Current packs/day: 0.25     Types: Cigarettes    Smokeless tobacco: Never    Tobacco comments:     Quit smoking: 1-2 cigarettes a day   Vaping Use    Vaping status: Never Used   Substance Use Topics    Alcohol use: Defer    Drug use: No       Allergies:  No Known Allergies    PCP: None, None    Current Meds:   No current facility-administered medications for this encounter.     Current Outpatient Medications   Medication Sig Dispense Refill    naproxen  (NAPROSYN ) 500 MG tablet Take 1 tablet by mouth 2 times daily for 5 days 10 tablet 0    oxyCODONE  (ROXICODONE ) 5 MG immediate release tablet Take 1 tablet by mouth every 6 hours as needed for Pain for up to 4 doses. Intended supply: 3 days. Take lowest dose possible to manage pain Max Daily Amount: 20 mg 4 tablet 0    ondansetron  (ZOFRAN -ODT) 4 MG disintegrating tablet Take 1 tablet by mouth 3 times daily as  needed for Nausea or Vomiting 21 tablet 0    miSOPROStol  (CYTOTEC ) 200 MCG tablet Take 4 tablets by mouth. Repeat in 12 hours. 8 tablet 0    nitrofurantoin, macrocrystal-monohydrate, (MACROBID) 100 MG capsule Take 1 capsule by mouth every 12 hours      loratadine  (CLARITIN ) 10 MG tablet Take 1 tablet by mouth daily (Patient not taking: Reported on 04/08/2024) 30 tablet 0    albuterol  sulfate HFA (VENTOLIN  HFA) 108 (90 Base) MCG/ACT inhaler Inhale 2 puffs into the lungs 4 times daily as needed for Wheezing (Patient not taking: Reported on 04/08/2024) 1 each 0    albuterol  (PROVENTIL ) (5 MG/ML) 0.5% nebulizer solution Take 0.5 mLs by nebulization every 6 hours as needed for Wheezing (Patient not taking: Reported on 04/08/2024) 120 each 0    acetaminophen  (TYLENOL ) 500 MG tablet Take 1 tablet by mouth every 6 hours as needed for Pain 20 tablet 0    azithromycin  (ZITHROMAX  Z-PAK) 250 MG tablet Take 2 tablets (  500 mg) on Day 1, and then take 1 tablet (250 mg) on days 2 through 5. (Patient not taking: Reported on 04/08/2024) 1 packet 0    ketoconazole  (NIZORAL ) 2 % cream Apply topically daily. (Patient not taking: Reported on 04/08/2024) 30 g 0    DULoxetine  (CYMBALTA ) 60 MG extended release capsule Take 1 capsule by mouth daily (Patient not taking: Reported on 04/08/2024) 30 capsule 1    traZODone  (DESYREL ) 100 MG tablet Take 1.5 tablets by mouth nightly (Patient not taking: Reported on 04/08/2024) 30 tablet 1    risperiDONE  (RISPERDAL ) 1 MG tablet Take 1 tablet by mouth 2 times daily (Patient not taking: Reported on 04/08/2024) 60 tablet 1    hydrocortisone  (ALA-CORT ) 1 % cream Apply topically 2 times daily. (Patient not taking: Reported on 04/08/2024) 30 g 1    Prenatal Vit-Fe Fumarate-FA (PRENATAL VITAMIN) 27-0.8 MG TABS Take 1 tablet by mouth daily (Patient not taking: Reported on 04/08/2024) 30 tablet 0    docusate sodium  (COLACE) 100 MG capsule Take 1 capsule by mouth 2 times daily (Patient not taking: Reported on 04/08/2024)  30 capsule 0     PHYSICAL EXAM   Physical Exam  Vitals and nursing note reviewed. Exam conducted with a chaperone present.   Constitutional:       General: She is in acute distress.      Appearance: Normal appearance. She is normal weight.   HENT:      Head: Normocephalic and atraumatic.      Nose: Nose normal.      Mouth/Throat:      Mouth: Mucous membranes are moist.   Eyes:      Conjunctiva/sclera: Conjunctivae normal.   Cardiovascular:      Rate and Rhythm: Normal rate.      Pulses: Normal pulses.   Pulmonary:      Effort: Pulmonary effort is normal. No respiratory distress.   Genitourinary:     Comments: Small amount of vaginal bleeding on external exam  Musculoskeletal:         General: No swelling or deformity. Normal range of motion.   Neurological:      General: No focal deficit present.      Mental Status: She is alert.   Psychiatric:         Mood and Affect: Mood normal.         Behavior: Behavior normal.         SCREENINGS                No data recorded       LAB, EKG AND DIAGNOSTIC RESULTS   Labs:  Recent Results (from the past 12 hours)   CBC with Auto Differential    Collection Time: 04/24/24  9:52 PM   Result Value Ref Range    WBC 5.9 3.6 - 11.0 K/uL    RBC 4.19 3.80 - 5.20 M/uL    Hemoglobin 10.1 (L) 11.5 - 16.0 g/dL    Hematocrit 70.0 (L) 35.0 - 47.0 %    MCV 71.4 (L) 80.0 - 99.0 FL    MCH 24.1 (L) 26.0 - 34.0 PG    MCHC 33.8 30.0 - 36.5 g/dL    RDW 81.2 (H) 88.4 - 14.5 %    Platelets 426 (H) 150 - 400 K/uL    MPV 8.6 (L) 8.9 - 12.9 FL    Nucleated RBCs 0.0 0.0 PER 100 WBC    nRBC 0.00 0.00 - 0.01 K/uL  Neutrophils % 62.1 32.0 - 75.0 %    Lymphocytes % 24.6 12.0 - 49.0 %    Monocytes % 10.2 5.0 - 13.0 %    Eosinophils % 2.2 0.0 - 7.0 %    Basophils % 0.7 0.0 - 1.0 %    Immature Granulocytes % 0.2 0 - 0.5 %    Neutrophils Absolute 3.67 1.80 - 8.00 K/UL    Lymphocytes Absolute 1.45 0.80 - 3.50 K/UL    Monocytes Absolute 0.60 0.00 - 1.00 K/UL    Eosinophils Absolute 0.13 0.00 - 0.40 K/UL     Basophils Absolute 0.04 0.00 - 0.10 K/UL    Immature Granulocytes Absolute 0.01 0.00 - 0.04 K/UL    Differential Type AUTOMATED         EKG:.Not Applicable     Radiologic Studies:  Radiographic images are visualized and preliminarily interpreted by the ED Provider with the following findings: Not Applicable..     Interpretation per the Radiologist below, if available at the time of this note:  No orders to display        Records Reviewed: Not Applicable    MEDICAL DECISION MAKING and ED COURSE   12:05 AM Differential and Considerations of tests not ordered:      Vitals:    Vitals:    04/24/24 2300 04/24/24 2315 04/24/24 2330 04/24/24 2345   BP: 117/70 101/62 (!) 101/58 117/73   Pulse: 74 81 81 76   Resp: 16 17 15 15    Temp:       SpO2: 98% 97% 97% 97%   Weight:       Height:           ED COURSE  ED Course as of 04/25/24 0005   Wed Apr 24, 2024   756 43 year old female presents for evaluation of vaginal bleeding and passing of clots.  Patient believes she is having a miscarriage.  Has had 7 miscarriages previously.  Has 6 living children.  Passed several large clots including products of conception.  Did discuss genetic testing which patient declined.  Getting a CBC to evaluate for anemia.  Treating pain with Toradol  and morphine .  Will outpatient follow-up with OB. [LW]   2343 Patient feeling better.  Pain controlled.  Bleeding has tapered off.  Will discharge home with OB follow-up.  Patient understands agrees with plan [LW]      ED Course User Index  [LW] Douglass Tinnie ORN, MD       Clinical Management Tools:  Not Applicable    Smoking Cessation: Not Applicable    Patient was given the following medications:  Medications   sodium chloride  0.9 % bolus 1,000 mL (0 mLs IntraVENous Stopped 04/25/24 0002)   morphine  (PF) injection 4 mg (4 mg IntraVENous Given 04/24/24 2158)   ketorolac  (TORADOL ) injection 15 mg (15 mg IntraVENous Given 04/24/24 2242)       CONSULTS: See ED Course/MDM for further details.  None      PROCEDURES   See ED course/ MDM for any additional procedures.  Procedures      SEPSIS REASSESSMENT & CRITICAL CARE TIME   SEPSIS REASSESSMENT: No suspicion of bacterial infection and not having 2 SIRS during this visit.    Critical Care: Patient does not meet Critical Care Time, 0 minutes  CLINICAL IMPRESSIONS     1. Miscarriage       SDOH/DISPOSITION/PLAN   Social Determinants affecting Treatment Plan: None    DISPOSITION Decision To Discharge 04/24/2024 11:42:57  PM   DISPOSITION CONDITION Stable         Discharge Note: The patient is stable for discharge home. The signs, symptoms, diagnosis, and discharge instructions have been discussed, understanding conveyed, and agreed upon. The patient is to follow up as recommended or return to ER should their symptoms worsen.      PATIENT REFERRED TO:  OB/GYN  Follow-up with OB/GYN            DISCHARGE MEDICATIONS:     Medication List        START taking these medications      naproxen  500 MG tablet  Commonly known as: Naprosyn   Take 1 tablet by mouth 2 times daily for 5 days     oxyCODONE  5 MG immediate release tablet  Commonly known as: Roxicodone   Take 1 tablet by mouth every 6 hours as needed for Pain for up to 4 doses. Intended supply: 3 days. Take lowest dose possible to manage pain Max Daily Amount: 20 mg            STOP taking these medications      ketorolac  10 MG tablet  Commonly known as: TORADOL             ASK your doctor about these medications      acetaminophen  500 MG tablet  Commonly known as: TYLENOL   Take 1 tablet by mouth every 6 hours as needed for Pain     * albuterol  sulfate HFA 108 (90 Base) MCG/ACT inhaler  Commonly known as: Ventolin  HFA  Inhale 2 puffs into the lungs 4 times daily as needed for Wheezing     * albuterol  (5 MG/ML) 0.5% nebulizer solution  Commonly known as: PROVENTIL   Take 0.5 mLs by nebulization every 6 hours as needed for Wheezing     azithromycin  250 MG tablet  Commonly known as: Zithromax  Z-Pak  Take 2 tablets (500 mg) on  Day 1, and then take 1 tablet (250 mg) on days 2 through 5.     docusate sodium  100 MG capsule  Commonly known as: Colace  Take 1 capsule by mouth 2 times daily     DULoxetine  60 MG extended release capsule  Commonly known as: CYMBALTA   Take 1 capsule by mouth daily     hydrocortisone  1 % cream  Commonly known as: Ala-Cort   Apply topically 2 times daily.     ketoconazole  2 % cream  Commonly known as: NIZORAL   Apply topically daily.     loratadine  10 MG tablet  Commonly known as: Claritin   Take 1 tablet by mouth daily     miSOPROStol  200 MCG tablet  Commonly known as: CYTOTEC   Take 4 tablets by mouth. Repeat in 12 hours.     nitrofurantoin (macrocrystal-monohydrate) 100 MG capsule  Commonly known as: MACROBID     ondansetron  4 MG disintegrating tablet  Commonly known as: ZOFRAN -ODT  Take 1 tablet by mouth 3 times daily as needed for Nausea or Vomiting     Prenatal Vitamin 27-0.8 MG Tabs  Take 1 tablet by mouth daily     risperiDONE  1 MG tablet  Commonly known as: RISPERDAL   Take 1 tablet by mouth 2 times daily     traZODone  100 MG tablet  Commonly known as: DESYREL   Take 1.5 tablets by mouth nightly           * This list has 2 medication(s) that are the same as other medications prescribed for you. Read the directions carefully, and  ask your doctor or other care provider to review them with you.                   Where to Get Your Medications        These medications were sent to [id number] [place], [place] - [address] - P [phone] [place] [place] [phone]  [address] [place] [address]      Phone: [phone]   naproxen  500 MG tablet  oxyCODONE  5 MG immediate release tablet           DISCONTINUED MEDICATIONS:  Current Discharge Medication List        STOP taking these medications       ketorolac  (TORADOL ) 10 MG tablet Comments:   Reason for Stopping:               I am the Primary Clinician of Record. Tinnie LELON Roys, MD (electronically signed)    (Please note that parts of this dictation were  completed with voice recognition software. Quite often unanticipated grammatical, syntax, homophones, and other interpretive errors are inadvertently transcribed by the computer software. Please disregards these errors. Please excuse any errors that have escaped final proofreading.)       Roys Tinnie LELON, MD  04/24/24 2351       Roys Tinnie LELON, MD  04/25/24 0005    "

## 2024-04-24 NOTE — ED Triage Notes (Addendum)
"  Patient approx [redacted] weeks pregnant, bleeding/cramping started today and now there is tissue/blood/clots in her underwear with pain.       +nausea and 10/10 abdominal/pelvic pain     15 pregnancies, 6 living children 7 natural miscarriages       Large clot/tissue removed from briefs upon getting into the room.     "

## 2024-04-24 NOTE — Discharge Instructions (Signed)
"      Thank you for choosing our Emergency Department for your care.  It is our privilege to care for you in your time of need.  In the next several days, you may receive a survey via email or mailed to your home about your experience with our team.  We would greatly appreciate you taking a few minutes to complete the survey, as we use this information to learn what we have done well and what we could be doing better. Thank you for trusting us  with your care!    Below you will find a list of your tests from today's visit.   Labs and Radiology Studies  Recent Results (from the past 12 hours)   CBC with Auto Differential    Collection Time: 04/24/24  9:52 PM   Result Value Ref Range    WBC 5.9 3.6 - 11.0 K/uL    RBC 4.19 3.80 - 5.20 M/uL    Hemoglobin 10.1 (L) 11.5 - 16.0 g/dL    Hematocrit 70.0 (L) 35.0 - 47.0 %    MCV 71.4 (L) 80.0 - 99.0 FL    MCH 24.1 (L) 26.0 - 34.0 PG    MCHC 33.8 30.0 - 36.5 g/dL    RDW 81.2 (H) 88.4 - 14.5 %    Platelets 426 (H) 150 - 400 K/uL    MPV 8.6 (L) 8.9 - 12.9 FL    Nucleated RBCs 0.0 0.0 PER 100 WBC    nRBC 0.00 0.00 - 0.01 K/uL    Neutrophils % 62.1 32.0 - 75.0 %    Lymphocytes % 24.6 12.0 - 49.0 %    Monocytes % 10.2 5.0 - 13.0 %    Eosinophils % 2.2 0.0 - 7.0 %    Basophils % 0.7 0.0 - 1.0 %    Immature Granulocytes % 0.2 0 - 0.5 %    Neutrophils Absolute 3.67 1.80 - 8.00 K/UL    Lymphocytes Absolute 1.45 0.80 - 3.50 K/UL    Monocytes Absolute 0.60 0.00 - 1.00 K/UL    Eosinophils Absolute 0.13 0.00 - 0.40 K/UL    Basophils Absolute 0.04 0.00 - 0.10 K/UL    Immature Granulocytes Absolute 0.01 0.00 - 0.04 K/UL    Differential Type AUTOMATED       No results found.  ------------------------------------------------------------------------------------------------------------  The evaluation and treatment you received in the Emergency Department were for an urgent problem. It is important that you follow-up with a doctor, nurse practitioner, or physician assistant to:  (1) confirm your  diagnosis,  (2) re-evaluation of changes in your illness and treatment, and (3) for ongoing care. Please take your discharge instructions with you when you go to your follow-up appointment.     If you have any problem arranging a follow-up appointment, contact us !  If your symptoms become worse or you do not improve as expected, please return to us . We are available 24 hours a day.     If a prescription has been provided, please fill it as soon as possible to prevent a delay in treatment. If you have any questions or reservations about taking the medication due to side effects or interactions with other medications, please call your primary care provider or contact us  directly.  Again, THANK YOU for choosing us  to care for YOU!    "

## 2024-04-25 ENCOUNTER — Inpatient Hospital Stay
Admit: 2024-04-25 | Discharge: 2024-04-25 | Disposition: A | Discharge status: Home / Self Care | Payer: Medicare (Managed Care) | Arrived: VH | Attending: Emergency Medicine

## 2024-04-25 MED ORDER — SODIUM CHLORIDE 0.9 % IV BOLUS
0.9 | Freq: Once | INTRAVENOUS | Status: AC
Start: 2024-04-25 — End: 2024-04-25
  Administered 2024-04-25: 03:00:00 1000 mL via INTRAVENOUS

## 2024-04-25 MED ORDER — KETOROLAC TROMETHAMINE 15 MG/ML IJ SOLN
15 | INTRAMUSCULAR | Status: AC
Start: 2024-04-25 — End: 2024-04-24
  Administered 2024-04-25: 04:00:00 15 mg via INTRAVENOUS

## 2024-04-25 MED ORDER — OXYCODONE HCL 5 MG PO TABS
5 | ORAL_TABLET | Freq: Four times a day (QID) | ORAL | 0 refills | Status: AC | PRN
Start: 2024-04-25 — End: 2024-04-28

## 2024-04-25 MED ORDER — MORPHINE SULFATE (PF) 4 MG/ML IV SOLN
4 | INTRAVENOUS | Status: AC
Start: 2024-04-25 — End: 2024-04-24
  Administered 2024-04-25: 03:00:00 4 mg via INTRAVENOUS

## 2024-04-25 MED ORDER — NAPROXEN 500 MG PO TABS
500 | ORAL_TABLET | Freq: Two times a day (BID) | ORAL | 0 refills | Status: AC
Start: 2024-04-25 — End: 2024-04-30

## 2024-04-25 MED FILL — SODIUM CHLORIDE 0.9 % IV SOLN: 0.9 % | INTRAVENOUS | Qty: 1000 | Fill #0

## 2024-04-25 MED FILL — MORPHINE SULFATE 4 MG/ML IV SOLN: 4 mg/mL | INTRAVENOUS | Qty: 1 | Fill #0

## 2024-04-25 MED FILL — KETOROLAC TROMETHAMINE 15 MG/ML IJ SOLN: 15 mg/mL | INTRAMUSCULAR | Qty: 1 | Fill #0
